# Patient Record
Sex: Female | Born: 1945 | Race: White | Hispanic: No | Marital: Married | Smoking: Former smoker
Health system: Southern US, Community
[De-identification: ages and names within clinical notes are randomized; demographics above are authoritative.]

## PROBLEM LIST (undated history)

## (undated) DIAGNOSIS — J40 Bronchitis, not specified as acute or chronic: Secondary | ICD-10-CM

## (undated) DIAGNOSIS — J301 Allergic rhinitis due to pollen: Secondary | ICD-10-CM

## (undated) DIAGNOSIS — F329 Major depressive disorder, single episode, unspecified: Secondary | ICD-10-CM

## (undated) DIAGNOSIS — J45909 Unspecified asthma, uncomplicated: Secondary | ICD-10-CM

## (undated) DIAGNOSIS — D494 Neoplasm of unspecified behavior of bladder: Secondary | ICD-10-CM

## (undated) DIAGNOSIS — G43909 Migraine, unspecified, not intractable, without status migrainosus: Secondary | ICD-10-CM

## (undated) DIAGNOSIS — Z973 Presence of spectacles and contact lenses: Secondary | ICD-10-CM

## (undated) DIAGNOSIS — F32A Depression, unspecified: Secondary | ICD-10-CM

## (undated) DIAGNOSIS — Z87442 Personal history of urinary calculi: Secondary | ICD-10-CM

## (undated) DIAGNOSIS — F419 Anxiety disorder, unspecified: Secondary | ICD-10-CM

## (undated) DIAGNOSIS — K635 Polyp of colon: Secondary | ICD-10-CM

## (undated) DIAGNOSIS — J449 Chronic obstructive pulmonary disease, unspecified: Secondary | ICD-10-CM

## (undated) DIAGNOSIS — R21 Rash and other nonspecific skin eruption: Secondary | ICD-10-CM

## (undated) HISTORY — DX: Allergic rhinitis due to pollen: J30.1

## (undated) HISTORY — DX: Unspecified asthma, uncomplicated: J45.909

## (undated) HISTORY — DX: Depression, unspecified: F32.A

## (undated) HISTORY — DX: Migraine, unspecified, not intractable, without status migrainosus: G43.909

## (undated) HISTORY — PX: COLONOSCOPY W/ POLYPECTOMY: SHX1380

## (undated) HISTORY — PX: OTHER SURGICAL HISTORY: SHX169

## (undated) HISTORY — DX: Major depressive disorder, single episode, unspecified: F32.9

## (undated) HISTORY — DX: Polyp of colon: K63.5

---

## 1988-06-19 HISTORY — PX: BREAST BIOPSY: SHX20

## 1999-05-20 ENCOUNTER — Encounter: Payer: Self-pay | Admitting: Family Medicine

## 1999-05-20 ENCOUNTER — Encounter: Admission: RE | Admit: 1999-05-20 | Discharge: 1999-05-20 | Payer: Self-pay | Admitting: Family Medicine

## 2000-09-25 ENCOUNTER — Encounter: Payer: Self-pay | Admitting: Family Medicine

## 2000-09-25 ENCOUNTER — Encounter: Admission: RE | Admit: 2000-09-25 | Discharge: 2000-09-25 | Payer: Self-pay | Admitting: Family Medicine

## 2001-10-10 ENCOUNTER — Encounter: Admission: RE | Admit: 2001-10-10 | Discharge: 2001-10-10 | Payer: Self-pay | Admitting: Family Medicine

## 2001-10-10 ENCOUNTER — Encounter: Payer: Self-pay | Admitting: Family Medicine

## 2002-10-23 ENCOUNTER — Encounter: Payer: Self-pay | Admitting: Family Medicine

## 2002-10-23 ENCOUNTER — Encounter: Admission: RE | Admit: 2002-10-23 | Discharge: 2002-10-23 | Payer: Self-pay | Admitting: Family Medicine

## 2004-01-06 ENCOUNTER — Encounter: Admission: RE | Admit: 2004-01-06 | Discharge: 2004-01-06 | Payer: Self-pay | Admitting: Family Medicine

## 2004-03-25 ENCOUNTER — Encounter (INDEPENDENT_AMBULATORY_CARE_PROVIDER_SITE_OTHER): Payer: Self-pay | Admitting: Specialist

## 2004-03-25 ENCOUNTER — Ambulatory Visit (HOSPITAL_COMMUNITY): Admission: RE | Admit: 2004-03-25 | Discharge: 2004-03-25 | Payer: Self-pay | Admitting: Gastroenterology

## 2004-11-20 ENCOUNTER — Emergency Department (HOSPITAL_COMMUNITY): Admission: EM | Admit: 2004-11-20 | Discharge: 2004-11-20 | Payer: Self-pay | Admitting: Emergency Medicine

## 2005-01-20 ENCOUNTER — Encounter: Admission: RE | Admit: 2005-01-20 | Discharge: 2005-01-20 | Payer: Self-pay | Admitting: Family Medicine

## 2005-02-08 ENCOUNTER — Encounter: Admission: RE | Admit: 2005-02-08 | Discharge: 2005-02-08 | Payer: Self-pay | Admitting: Family Medicine

## 2006-03-09 ENCOUNTER — Encounter: Admission: RE | Admit: 2006-03-09 | Discharge: 2006-03-09 | Payer: Self-pay | Admitting: Family Medicine

## 2007-04-18 ENCOUNTER — Encounter: Admission: RE | Admit: 2007-04-18 | Discharge: 2007-04-18 | Payer: Self-pay | Admitting: Family Medicine

## 2007-06-20 LAB — CONVERTED CEMR LAB: Pap Smear: NORMAL

## 2007-09-02 ENCOUNTER — Encounter: Admission: RE | Admit: 2007-09-02 | Discharge: 2007-09-02 | Payer: Self-pay | Admitting: Family Medicine

## 2008-04-20 ENCOUNTER — Encounter: Admission: RE | Admit: 2008-04-20 | Discharge: 2008-04-20 | Payer: Self-pay | Admitting: Family Medicine

## 2008-04-30 ENCOUNTER — Encounter: Admission: RE | Admit: 2008-04-30 | Discharge: 2008-04-30 | Payer: Self-pay | Admitting: Family Medicine

## 2008-04-30 ENCOUNTER — Encounter: Payer: Self-pay | Admitting: Family Medicine

## 2008-09-17 DIAGNOSIS — Z8659 Personal history of other mental and behavioral disorders: Secondary | ICD-10-CM

## 2008-10-05 ENCOUNTER — Ambulatory Visit: Payer: Self-pay | Admitting: Family Medicine

## 2008-10-05 DIAGNOSIS — M899 Disorder of bone, unspecified: Secondary | ICD-10-CM

## 2008-10-05 DIAGNOSIS — M949 Disorder of cartilage, unspecified: Secondary | ICD-10-CM

## 2009-04-05 ENCOUNTER — Telehealth: Payer: Self-pay | Admitting: Family Medicine

## 2009-04-14 ENCOUNTER — Encounter: Payer: Self-pay | Admitting: Family Medicine

## 2009-04-29 ENCOUNTER — Encounter: Admission: RE | Admit: 2009-04-29 | Discharge: 2009-04-29 | Payer: Self-pay | Admitting: Family Medicine

## 2009-05-04 ENCOUNTER — Encounter: Payer: Self-pay | Admitting: Family Medicine

## 2009-09-23 ENCOUNTER — Ambulatory Visit: Payer: Self-pay | Admitting: Family Medicine

## 2009-09-23 DIAGNOSIS — E785 Hyperlipidemia, unspecified: Secondary | ICD-10-CM

## 2009-09-23 DIAGNOSIS — Z87891 Personal history of nicotine dependence: Secondary | ICD-10-CM | POA: Insufficient documentation

## 2009-09-23 LAB — CONVERTED CEMR LAB
CO2: 31 meq/L (ref 19–32)
Calcium: 9.5 mg/dL (ref 8.4–10.5)
Cholesterol: 218 mg/dL — ABNORMAL HIGH (ref 0–200)
GFR calc non Af Amer: 66.86 mL/min (ref >60)
Total CHOL/HDL Ratio: 4

## 2009-10-04 ENCOUNTER — Telehealth: Payer: Self-pay | Admitting: Family Medicine

## 2009-10-27 ENCOUNTER — Ambulatory Visit: Payer: Self-pay | Admitting: Family Medicine

## 2009-10-27 LAB — CONVERTED CEMR LAB
Alkaline Phosphatase: 62 units/L (ref 39–117)
BUN: 20 mg/dL (ref 6–23)
Basophils Relative: 0.5 % (ref 0.0–3.0)
CO2: 29 meq/L (ref 19–32)
Chloride: 105 meq/L (ref 96–112)
Creatinine, Ser: 0.9 mg/dL (ref 0.4–1.2)
Eosinophils Relative: 1.2 % (ref 0.0–5.0)
HDL: 60.8 mg/dL (ref >39.00)
Hemoglobin: 14.7 g/dL (ref 12.0–15.0)
LDL Cholesterol: 102 mg/dL — ABNORMAL HIGH (ref 0–99)
Lymphocytes Relative: 21.7 % (ref 12.0–46.0)
Neutrophils Relative %: 71.1 % (ref 43.0–77.0)
Nitrite: NEGATIVE
Platelets: 251 10*3/uL (ref 150.0–400.0)
Triglycerides: 48 mg/dL (ref 0.0–149.0)
WBC Urine, dipstick: NEGATIVE
WBC: 8.1 10*3/uL (ref 4.5–10.5)
pH: 6

## 2009-11-01 LAB — HM PAP SMEAR

## 2009-11-03 ENCOUNTER — Ambulatory Visit: Payer: Self-pay | Admitting: Family Medicine

## 2009-11-03 ENCOUNTER — Other Ambulatory Visit: Admission: RE | Admit: 2009-11-03 | Discharge: 2009-11-03 | Payer: Self-pay | Admitting: Family Medicine

## 2009-11-03 DIAGNOSIS — R319 Hematuria, unspecified: Secondary | ICD-10-CM

## 2009-11-03 LAB — CONVERTED CEMR LAB
Nitrite: NEGATIVE
Specific Gravity, Urine: <1.005
pH: 7.5

## 2009-11-08 LAB — CONVERTED CEMR LAB: Pap Smear: NEGATIVE

## 2009-12-14 ENCOUNTER — Encounter: Payer: Self-pay | Admitting: Family Medicine

## 2010-01-10 ENCOUNTER — Encounter: Payer: Self-pay | Admitting: Family Medicine

## 2010-02-14 ENCOUNTER — Ambulatory Visit (HOSPITAL_COMMUNITY): Admission: RE | Admit: 2010-02-14 | Discharge: 2010-02-14 | Payer: Self-pay | Admitting: Urology

## 2010-02-22 ENCOUNTER — Encounter: Payer: Self-pay | Admitting: Family Medicine

## 2010-03-22 ENCOUNTER — Telehealth: Payer: Self-pay | Admitting: Family Medicine

## 2010-03-23 ENCOUNTER — Ambulatory Visit: Payer: Self-pay | Admitting: Internal Medicine

## 2010-03-23 DIAGNOSIS — F17200 Nicotine dependence, unspecified, uncomplicated: Secondary | ICD-10-CM

## 2010-05-04 ENCOUNTER — Ambulatory Visit: Payer: Self-pay | Admitting: Family Medicine

## 2010-05-04 DIAGNOSIS — N200 Calculus of kidney: Secondary | ICD-10-CM

## 2010-05-04 LAB — HM MAMMOGRAPHY: HM Mammogram: NEGATIVE

## 2010-05-05 ENCOUNTER — Encounter: Payer: Self-pay | Admitting: Family Medicine

## 2010-05-05 ENCOUNTER — Encounter: Admission: RE | Admit: 2010-05-05 | Discharge: 2010-05-05 | Payer: Self-pay | Admitting: Family Medicine

## 2010-05-05 LAB — CONVERTED CEMR LAB
AST: 24 units/L (ref 0–37)
Albumin: 4.2 g/dL (ref 3.5–5.2)
Alkaline Phosphatase: 66 units/L (ref 39–117)
HDL: 65 mg/dL (ref >39.00)
Total Bilirubin: 0.5 mg/dL (ref 0.3–1.2)
Total Protein: 6.5 g/dL (ref 6.0–8.3)
VLDL: 11 mg/dL (ref 0.0–40.0)

## 2010-07-10 ENCOUNTER — Encounter: Payer: Self-pay | Admitting: Family Medicine

## 2010-07-19 NOTE — Assessment & Plan Note (Signed)
Summary: chest congestion/dm   Vital Signs:  Patient profile:   65 year old female Menstrual status:  postmenopausal Weight:      144 pounds Temp:     97.9 degrees F oral BP sitting:   120 / 80  (right arm) Cuff size:   regular  Vitals Entered By: Duard Brady LPN (March 23, 2010 11:54 AM) CC: c/o congestion and cough - just completed amox x10days Is Patient Diabetic? No   CC:  c/o congestion and cough - just completed amox x10days.  History of Present Illness: 35  -year-old tobacco user, who [facility].  She has completed 10 days of amoxicillin and still had a nonproductive cough.  She continues to smoke.  She denies any chest pain, shortness of breath, wheezing, or purulence sputum production.  She has a history of glucose intolerance, and dyslipidemia.  Preventive Screening-Counseling & Management  Alcohol-Tobacco     Smoking Cessation Counseling: yes  Allergies: 1)  Acetyl Salicylic Acid (Aspirin)  Past History:  Past Medical History: Reviewed history from 09/17/2008 and no changes required. Depression Migraines Hay fever/Allergies Colon Polyps  Review of Systems       The patient complains of prolonged cough.  The patient denies anorexia, fever, weight loss, weight gain, vision loss, decreased hearing, hoarseness, chest pain, syncope, dyspnea on exertion, peripheral edema, headaches, hemoptysis, abdominal pain, melena, hematochezia, severe indigestion/heartburn, hematuria, incontinence, genital sores, muscle weakness, suspicious skin lesions, transient blindness, difficulty walking, depression, unusual weight change, abnormal bleeding, enlarged lymph nodes, angioedema, and breast masses.    Physical Exam  General:  Well-developed,well-nourished,in no acute distress; alert,appropriate and cooperative throughout examination Head:  Normocephalic and atraumatic without obvious abnormalities. No apparent  alopecia or balding. Eyes:  No corneal or conjunctival inflammation noted. EOMI. Perrla. Funduscopic exam benign, without hemorrhages, exudates or papilledema. Vision grossly normal. Ears:  External ear exam shows no significant lesions or deformities.  Otoscopic examination reveals clear canals, tympanic membranes are intact bilaterally without bulging, retraction, inflammation or discharge. Hearing is grossly normal bilaterally. Mouth:  Oral mucosa and oropharynx without lesions or exudates.  Teeth in good repair. Neck:  No deformities, masses, or tenderness noted. Lungs:  Normal respiratory effort, chest expands symmetrically. Lungs are clear to auscultation, no crackles or wheezes.  O2 saturation 98% Heart:  Normal rate and regular rhythm. S1 and S2 normal without gallop, murmur, click, rub or other extra sounds.   Impression & Recommendations:  Problem # 1:  URI (ICD-465.9)  Her updated medication list for this problem includes:    Hydrocodone-homatropine 5-1.5 Mg/41ml Syrp (Hydrocodone-homatropine) ..... One tsp every 6 hours for cough  Problem # 2:  TOBACCO USE (ICD-305.1)  Complete Medication List: 1)  Alendronate Sodium 70 Mg Tabs (Alendronate sodium) .... Once weekly 2)  Fluoxetine Hcl 20 Mg Tabs (Fluoxetine hcl) .... Once daily 3)  Simvastatin 20 Mg Tabs (Simvastatin) .... Once daily at bedtime 4)  Calcium-vitamin D 600-125 Mg-unit Tabs (Calcium-vitamin d) .... Once daily 5)  Omega 3 340 Mg Cpdr (Omega-3 fatty acids) .... Once daily 6)  Hydrocodone-homatropine 5-1.5 Mg/32ml Syrp (Hydrocodone-homatropine) .... One tsp every 6 hours for cough  Patient Instructions: 1)  Tobacco is very bad for your health and your loved ones! You Should stop smoking!. 2)  Get plenty of rest, drink lots of clear liquids, and use Tylenol or Ibuprofen for fever and comfort. Return in 7-10 days if you're not better:sooner if you're feeling worse.  Prescriptions: HYDROCODONE-HOMATROPINE 5-1.5 MG/5ML  SYRP (HYDROCODONE-HOMATROPINE) one tsp every 6 hours for cough  #6 oz x 1   Entered and Authorized by:   Gordy Savers  MD   Signed by:   Gordy Savers  MD on 03/23/2010   Method used:   Print then Give to Patient   RxID:   [id number]    Immunization History:  Tetanus/Td Immunization History:    Tetanus/Td:  Tdap (08/27/2007)  Influenza Immunization History:    Influenza:  Historical (03/19/2010)  Pneumovax Immunization History:    Pneumovax:  Pneumovax (11/03/2009) flu vaccine given at [facility]

## 2010-07-19 NOTE — Assessment & Plan Note (Signed)
Summary: med check/refills/pt coming in fasting/cjr   Vital Signs:  Patient profile:   65 year old female Menstrual status:  postmenopausal Height:      66 inches Weight:      154 pounds Temp:     97.5 degrees F oral BP sitting:   90 / 72  (left arm) Cuff size:   regular  Vitals Entered By: Sid Falcon LPN (September 23, 1608 8:43 AM) CC: 6 month follow-up visit, med refills   History of Present Illness: Patient seen for followup medical problems as below.  History of depression. Treated with fluoxetine for many years. Compliant with therapy. Symptoms stable. No suicidal ideation. Patient reluctant to stop.  History of osteopenia. Repeat DEXA later this year. No history of adult fracture. Not compliant with vitamin D or calcium. Ongoing nicotine use as a risk factor.  Positive family history CAD-sister age 86 and brother age 87. Sister also had type 2 diabetes. Patient does not have any personal history of CAD or peripheral vascular disease and no recent symptoms such as chest pain.patient has had previous hyperlipidemia but does not recall values and not checked in some time.  No physical in couple of years but has had colonoscopy and mammogram within the past year.  Preventive Screening-Counseling & Management  Alcohol-Tobacco     Smoking Status: current     Packs/Day: 1.5     Year Started: 1951  Allergies: 1)  Acetyl Salicylic Acid (Aspirin)  Past History:  Past Medical History: Last updated: 09/17/2008 Depression Migraines Hay fever/Allergies Colon Polyps  Past Surgical History: Last updated: 09/17/2008 Caesarean Section 1974, 1977, 1984 Breast biopsy 1990  Social History: Last updated: 10/05/2008 Married Homemaker Current Smoker Alcohol use-yes PMH reviewed for relevance, PSH reviewed for relevance  Social History: Packs/Day:  1.5  Review of Systems  The patient denies anorexia, fever, weight loss, weight gain, chest pain, syncope, dyspnea on  exertion, peripheral edema, prolonged cough, headaches, hemoptysis, abdominal pain, melena, hematochezia, severe indigestion/heartburn, muscle weakness, and depression.    Physical Exam  General:  Well-developed,well-nourished,in no acute distress; alert,appropriate and cooperative throughout examination Eyes:  pupils equal, pupils round, and pupils reactive to light.   Ears:  External ear exam shows no significant lesions or deformities.  Otoscopic examination reveals clear canals, tympanic membranes are intact bilaterally without bulging, retraction, inflammation or discharge. Hearing is grossly normal bilaterally. Mouth:  Oral mucosa and oropharynx without lesions or exudates.  Teeth in good repair. Neck:  No deformities, masses, or tenderness noted. Lungs:  Normal respiratory effort, chest expands symmetrically. Lungs are clear to auscultation, no crackles or wheezes. Heart:  normal rate and regular rhythm.   Psych:  normally interactive, good eye contact, not anxious appearing, and not depressed appearing.     Impression & Recommendations:  Problem # 1:  DEPRESSION (ICD-311) Assessment Unchanged  Her updated medication list for this problem includes:    Fluoxetine Hcl 20 Mg Tabs (Fluoxetine hcl) ..... Once daily  Problem # 2:  OSTEOPENIA (ICD-733.90) patient encouraged use vitamin D and calcium more regularly.  DEXA next year with mammogram. Her updated medication list for this problem includes:    Alendronate Sodium 70 Mg Tabs (Alendronate sodium) ..... Once weekly  Problem # 3:  HYPERLIPIDEMIA (ICD-272.4) moderate risk factors for CAD. Orders: Venipuncture ([address]) TLB-Lipid Panel (80061-LIPID)  Her updated medication list for this problem includes:    Crestor 5 Mg Tabs (Rosuvastatin calcium) ..... One by mouth once daily  Problem # 4:  PERS HX TOBACCO USE PRESENTING HAZARDS HEALTH (ICD-V15.82) discussed cessation but current motivation is low.  Complete Medication  List: 1)  Alendronate Sodium 70 Mg Tabs (Alendronate sodium) .... Once weekly 2)  Fluoxetine Hcl 20 Mg Tabs (Fluoxetine hcl) .... Once daily 3)  Crestor 5 Mg Tabs (Rosuvastatin calcium) .... One by mouth once daily  Other Orders: TLB-BMP (Basic Metabolic Panel-BMET) (80048-METABOL)  Patient Instructions: 1)  Schedule complete physical examination Prescriptions: CRESTOR 5 MG TABS (ROSUVASTATIN CALCIUM) one by mouth once daily  #30 x 5   Entered and Authorized by:   Evelena Peat MD   Signed by:   Evelena Peat MD on 09/23/2009   Method used:   Electronically to        [facility]* (retail)       [address]       [place], [place]  [address]       Ph: [phone] or [phone]       Fax: [phone]   RxID:   [id number] ALENDRONATE SODIUM 70 MG TABS (ALENDRONATE SODIUM) once weekly  #4 x 11   Entered and Authorized by:   Evelena Peat MD   Signed by:   Evelena Peat MD on 09/23/2009   Method used:   Electronically to        [facility]* (retail)       [address] [place], [place]  [address]       Ph: [phone] or [phone]       Fax: [phone]   RxID:   [id number] FLUOXETINE HCL 20 MG TABS (FLUOXETINE HCL) once daily  #30 x 11   Entered and Authorized by:   Evelena Peat MD   Signed by:   Evelena Peat MD on 09/23/2009   Method used:   Electronically to        [facility]* (retail)       [address] [place], [place]  [address]       Ph: [phone] or [phone]       Fax: [phone]   RxID:   [id number]

## 2010-07-19 NOTE — Consult Note (Signed)
Summary: [facility]  [facility]   Imported By: Maryln Gottron 12/21/2009 13:10:09  _____________________________________________________________________  External Attachment:    Type:   Image     Comment:   External Document

## 2010-07-19 NOTE — Consult Note (Signed)
Summary: [facility]  [facility]   Imported By: Maryln Gottron 02/28/2010 14:45:54  _____________________________________________________________________  External Attachment:    Type:   Image     Comment:   External Document

## 2010-07-19 NOTE — Progress Notes (Signed)
Summary: order for bone density  Phone Note Call from Patient   Caller: Patient Call For: Evelena Peat MD Summary of Call: has bone density scheduled for 05/05/10 at the [facility] on [address]. Ordered needed faxed over there before appt please. call if problem [phone] Initial call taken by: Duard Brady LPN,  March 22, 2010 4:57 PM  Follow-up for Phone Call        Rx faxed, pt informed, confirmation received Follow-up by: Sid Falcon LPN,  March 23, 2010 11:59 AM

## 2010-07-19 NOTE — Consult Note (Signed)
Summary: [facility]  [facility]   Imported By: Maryln Gottron 01/13/2010 14:24:28  _____________________________________________________________________  External Attachment:    Type:   Image     Comment:   External Document

## 2010-07-19 NOTE — Assessment & Plan Note (Signed)
Summary: 6 month rov/njr   Vital Signs:  Patient profile:   65 year old female Menstrual status:  postmenopausal Weight:      143 pounds Temp:     97.9 degrees F oral BP sitting:   110 / 72  (left arm) Cuff size:   regular  Vitals Entered By: Sid Falcon LPN (May 04, 2010 8:50 AM)  History of Present Illness: Patient here for followup regarding several items.  She had hematuria last time I saw her was referred to urologist. Large kidney stone which required lithotripsy and temporary stent. No problems since then. She is here discussing calcium supplement which is not taking consistently.  She has history of osteopenia and has scheduled DEXA scan tomorrow. Takes alendronate has been on this she estimates over 10 years. Still smokes. No history of recent fracture.   Walks regularly.  Hyperlipidemia on simvastatin. Sometimes forgets to take. No side effects from medication. No history of CAD.  Lipid Management History:      Positive NCEP/ATP III risk factors include female age 93 years old or older and current tobacco user.  Negative NCEP/ATP III risk factors include no history of early menopause without estrogen hormone replacement, non-diabetic, HDL cholesterol greater than 60, non-hypertensive, no ASHD (atherosclerotic heart disease), no prior stroke/TIA, no peripheral vascular disease, and no history of aortic aneurysm.     Allergies: 1)  Acetyl Salicylic Acid (Aspirin)  Past History:  Past Medical History: Last updated: 09/17/2008 Depression Migraines Hay fever/Allergies Colon Polyps  Past Surgical History: Last updated: 09/17/2008 Caesarean Section 1974, 1977, 1984 Breast biopsy 1990  Family History: Last updated: 10/05/2008 Arthritis Stroke Hypertension Family History Depression Migraines Colon polyps  Social History: Last updated: 10/05/2008 Married Homemaker Current Smoker Alcohol use-yes  Risk Factors: Smoking Status: current  (11/03/2009) Packs/Day: 1.5 (11/03/2009) PMH-FH-SH reviewed for relevance  Review of Systems  The patient denies anorexia, fever, chest pain, syncope, peripheral edema, prolonged cough, headaches, hemoptysis, abdominal pain, melena, hematochezia, and severe indigestion/heartburn.    Physical Exam  General:  Well-developed,well-nourished,in no acute distress; alert,appropriate and cooperative throughout examination Mouth:  Oral mucosa and oropharynx without lesions or exudates.  Teeth in good repair. Neck:  No deformities, masses, or tenderness noted. Lungs:  Normal respiratory effort, chest expands symmetrically. Lungs are clear to auscultation, no crackles or wheezes. Heart:  normal rate and regular rhythm.   Extremities:  No clubbing, cyanosis, edema, or deformity noted with normal full range of motion of all joints.   Neurologic:  alert & oriented X3 and cranial nerves II-XII intact.     Impression & Recommendations:  Problem # 1:  HYPERLIPIDEMIA (ICD-272.4)  Her updated medication list for this problem includes:    Simvastatin 20 Mg Tabs (Simvastatin) ..... Once daily at bedtime  Orders: Venipuncture ([address]) Specimen Handling ([address]) TLB-Lipid Panel (80061-LIPID) TLB-Hepatic/Liver Function Pnl (80076-HEPATIC)  Problem # 2:  OSTEOPENIA (ICD-733.90) discussed rec to get most of her calcium from dietary sources with hx of kidney stones vs other supplements. DEXA for tomorow.  Discussed interval off Fosamax since taking for several years. Her updated medication list for this problem includes:    Alendronate Sodium 70 Mg Tabs (Alendronate sodium) ..... Once weekly    Calcium-vitamin D 600-125 Mg-unit Tabs (Calcium-vitamin d) ..... Once daily  Problem # 3:  NEPHROLITHIASIS (ICD-592.0)  Complete Medication List: 1)  Alendronate Sodium 70 Mg Tabs (Alendronate sodium) .... Once weekly 2)  Fluoxetine Hcl 20 Mg Tabs (Fluoxetine hcl) .... Once daily 3)  Simvastatin  20 Mg Tabs  (Simvastatin) .... Once daily at bedtime 4)  Calcium-vitamin D 600-125 Mg-unit Tabs (Calcium-vitamin d) .... Once daily  Lipid Assessment/Plan:      Based on NCEP/ATP III, the patient's risk factor category is "0-1 risk factors".  The patient's lipid goals are as follows: Total cholesterol goal is 200; LDL cholesterol goal is 160; HDL cholesterol goal is 40; Triglyceride goal is 150.    Patient Instructions: 1)  Please schedule a follow-up appointment in 6 months .  2)  Try to get around 1200 mg calcium per day from dietary sources as much as possible. 3)  Vit D 800 to 1,000 International Units per day. 4)  Weight bearing exercise such as walking as much as possible. 5)  Hold alendronate for now Prescriptions: SIMVASTATIN 20 MG TABS (SIMVASTATIN) once daily at bedtime  #30 Each x 11   Entered and Authorized by:   Evelena Peat MD   Signed by:   Evelena Peat MD on 05/04/2010   Method used:   Electronically to        [facility] [address] [phone]* (retail)       [address] [place], [place]  [address]       Ph: [phone]       Fax: [phone]   RxID:   [id number]    Orders Added: 1)  Venipuncture [44010] 2)  Specimen Handling [99000] 3)  TLB-Lipid Panel [80061-LIPID] 4)  TLB-Hepatic/Liver Function Pnl [80076-HEPATIC] 5)  Est. Patient Level IV [[address]]

## 2010-07-19 NOTE — Assessment & Plan Note (Signed)
Summary: CPX//ALP   Vital Signs:  Patient profile:   65 year old female Menstrual status:  postmenopausal Height:      66 inches Weight:      155 pounds BMI:     25.11 Temp:     98.2 degrees F oral Pulse rate:   72 / minute Pulse rhythm:   regular Resp:     12 per minute BP sitting:   110 / 80  (left arm) Cuff size:   regular  Vitals Entered By: Sid Falcon LPN (Nov 03, 2009 8:59 AM)  Nutrition Counseling: Patient's BMI is greater than 25 and therefore counseled on weight management options. CC: CPX, pap   Chief Complaint:  CPX and pap.  History of Present Illness: Here for CPE.  Still smoking.  Mammogram 11/10 normal.  Tetanus 2009.  Last pap 2009.  No hx abnl pap and monogamous. DEXA 2009 osteopenia. Recent labs reviewed with pt.  Signif for 2 plus blood in urine.  No gross hematuria. Normal colonoscopy 2006.  Intolerant of Crestor and doing well with Simvastatin.  Preventive Screening-Counseling & Management  Alcohol-Tobacco     Smoking Status: current     Packs/Day: 1.5  Allergies: 1)  Acetyl Salicylic Acid (Aspirin)  Past History:  Past Medical History: Last updated: 09/17/2008 Depression Migraines Hay fever/Allergies Colon Polyps  Past Surgical History: Last updated: 09/17/2008 Caesarean Section 1974, 1977, 1984 Breast biopsy 1990  Family History: Last updated: 10/05/2008 Arthritis Stroke Hypertension Family History Depression Migraines Colon polyps  Social History: Last updated: 10/05/2008 Married Homemaker Current Smoker Alcohol use-yes  Risk Factors: Smoking Status: current (11/03/2009) Packs/Day: 1.5 (11/03/2009) PMH-FH-SH reviewed for relevance  Review of Systems  The patient denies anorexia, fever, weight loss, weight gain, vision loss, decreased hearing, hoarseness, chest pain, syncope, dyspnea on exertion, peripheral edema, prolonged cough, headaches, hemoptysis, abdominal pain, melena, hematochezia, severe  indigestion/heartburn, hematuria, incontinence, genital sores, muscle weakness, suspicious skin lesions, transient blindness, difficulty walking, depression, unusual weight change, abnormal bleeding, enlarged lymph nodes, and breast masses.    Physical Exam  General:  Well-developed,well-nourished,in no acute distress; alert,appropriate and cooperative throughout examination Head:  Normocephalic and atraumatic without obvious abnormalities. No apparent alopecia or balding. Eyes:  pupils equal, pupils round, and pupils reactive to light.   Ears:  External ear exam shows no significant lesions or deformities.  Otoscopic examination reveals clear canals, tympanic membranes are intact bilaterally without bulging, retraction, inflammation or discharge. Hearing is grossly normal bilaterally. Mouth:  Oral mucosa and oropharynx without lesions or exudates.  Teeth in good repair. Neck:  No deformities, masses, or tenderness noted. Breasts:  No mass, nodules, thickening, tenderness, bulging, retraction, inflamation, nipple discharge or skin changes noted.   Lungs:  Normal respiratory effort, chest expands symmetrically. Lungs are clear to auscultation, no crackles or wheezes. Heart:  Normal rate and regular rhythm. S1 and S2 normal without gallop, murmur, click, rub or other extra sounds. Abdomen:  Bowel sounds positive,abdomen soft and non-tender without masses, organomegaly or hernias noted. Genitalia:  normal introitus, no external lesions, no vaginal discharge, mucosa pink and moist, and no vaginal or cervical lesions.  Some mucosal atrophy noted. Stenotic cervical os. Extremities:  No clubbing, cyanosis, edema, or deformity noted with normal full range of motion of all joints.   Neurologic:  alert & oriented X3, cranial nerves II-XII intact, and strength normal in all extremities.   Skin:  no rashes.   Cervical Nodes:  No lymphadenopathy noted Psych:  normally interactive, good eye  contact, not anxious  appearing, and not depressed appearing.     Impression & Recommendations:  Problem # 1:  Preventive Health Care (ICD-V70.0) Smoking cessation discussed.  Pneumovax given.  Mammogram and DEXA by next November.  Problem # 2:  HEMATURIA UNSPECIFIED (ICD-599.70)  repeat UA and if hematuria persists will rec urology referral.  Orders: UA Dipstick w/o Micro (manual) ([address]) Urology Referral (Urology)  Complete Medication List: 1)  Alendronate Sodium 70 Mg Tabs (Alendronate sodium) .... Once weekly 2)  Fluoxetine Hcl 20 Mg Tabs (Fluoxetine hcl) .... Once daily 3)  Simvastatin 20 Mg Tabs (Simvastatin) .... Once daily at bedtime 4)  Calcium-vitamin D 600-125 Mg-unit Tabs (Calcium-vitamin d) .... Once daily 5)  Omega 3 340 Mg Cpdr (Omega-3 fatty acids) .... Once daily  Other Orders: Pneumococcal Vaccine ([address]) Admin 1st Vaccine (09811)  Patient Instructions: 1)  We will need to schedule your next mammogram and DEXA scan by next November. 2)  Please schedule a follow-up appointment in 6 months .  3)  Stop smoking tips: Choose a quit date. Cut down before the quit date. Decide what you will do as a substitute when you feel the urge to smoke(gum, toothpick, exercise).  4)  It is important that you exercise reguarly at least 20 minutes 5 times a week. If you develop chest pain, have severe difficulty breathing, or feel very tired, stop exercising immediately and seek medical attention.     Laboratory Results   Urine Tests    Routine Urinalysis   Color: yellow Appearance: Clear Glucose: negative   (Normal Range: Negative) Bilirubin: negative   (Normal Range: Negative) Ketone: negative   (Normal Range: Negative) Spec. Gravity: <1.005   (Normal Range: 1.003-1.035) Blood: moderate   (Normal Range: Negative) pH: 7.5   (Normal Range: 5.0-8.0) Protein: trace   (Normal Range: Negative) Urobilinogen: 0.2   (Normal Range: 0-1) Nitrite: negative   (Normal Range: Negative) Leukocyte  Esterace: small   (Normal Range: Negative)    Comments: Sid Falcon LPN  Nov 03, 2009 10:09 AM       Immunizations Administered:  Pneumonia Vaccine:    Vaccine Type: Pneumovax    Site: left deltoid    Mfr: Merck    Dose: 0.5 ml    Route: IM    Given by: Sid Falcon LPN    Exp. Date: 04/13/2011    Lot #: [id number]

## 2010-07-19 NOTE — Progress Notes (Signed)
Summary: D/C Crestor, bad side effects  Phone Note Call from Patient Call back at Home Phone [phone]   Caller: Patient Call For: Evelena Peat MD Summary of Call: VM from pt reporting she began noticing a "flat affect a few days after taking Crestor...daughter wondering what was wrong with her...took pill for about 1 week and actually had suicidal thoughts.  D/C med last Thursday and feels "back to herself".  Any suggestions for alternate? Repeat labs scheduled for May 11, ROV May 18.  [facility] in [place], or samples. Initial call taken by: Sid Falcon LPN,  October 04, 2009 9:28 AM  Follow-up for Phone Call        these symptoms would be very unusual for Crestor but d/c and start Simvastatin 20 mg by mouth at bedtime and labs in May as scheduled. Follow-up by: Evelena Peat MD,  October 04, 2009 9:51 AM  Additional Follow-up for Phone Call Additional follow up Details #1::        Pt informed Additional Follow-up by: Sid Falcon LPN,  October 04, 2009 10:24 AM    New/Updated Medications: SIMVASTATIN 20 MG TABS (SIMVASTATIN) once daily SIMVASTATIN 20 MG TABS (SIMVASTATIN) once daily at bedtime Prescriptions: SIMVASTATIN 20 MG TABS (SIMVASTATIN) once daily  #30 x 3   Entered by:   Sid Falcon LPN   Authorized by:   Evelena Peat MD   Signed by:   Sid Falcon LPN on 09/81/1914   Method used:   Electronically to        [facility]* (retail)       [address] [place], [place]  [address]       Ph: [phone] or [phone]       Fax: [phone]   RxID:   [id number]

## 2010-09-02 LAB — BASIC METABOLIC PANEL
CO2: 29 mEq/L (ref 19–32)
Creatinine, Ser: 1 mg/dL (ref 0.4–1.2)
Potassium: 4.2 mEq/L (ref 3.5–5.1)

## 2010-09-02 LAB — CBC
HCT: 44.2 % (ref 36.0–46.0)
Hemoglobin: 14.9 g/dL (ref 12.0–15.0)
MCH: 32.4 pg (ref 26.0–34.0)
RDW: 13.9 % (ref 11.5–15.5)

## 2010-09-02 LAB — SURGICAL PCR SCREEN
MRSA, PCR: NEGATIVE
Staphylococcus aureus: NEGATIVE

## 2010-09-15 ENCOUNTER — Other Ambulatory Visit: Payer: Self-pay | Admitting: Family Medicine

## 2010-11-02 ENCOUNTER — Encounter: Payer: Self-pay | Admitting: Family Medicine

## 2010-11-04 ENCOUNTER — Ambulatory Visit (INDEPENDENT_AMBULATORY_CARE_PROVIDER_SITE_OTHER): Payer: 59 | Admitting: Family Medicine

## 2010-11-04 ENCOUNTER — Encounter: Payer: Self-pay | Admitting: Family Medicine

## 2010-11-04 VITALS — BP 110/78 | Temp 97.8°F | Ht 67.0 in | Wt 149.0 lb

## 2010-11-04 DIAGNOSIS — F329 Major depressive disorder, single episode, unspecified: Secondary | ICD-10-CM

## 2010-11-04 MED ORDER — FLUOXETINE HCL 20 MG PO CAPS: 20.0000 mg | ORAL_CAPSULE | Freq: Every day | ORAL | Status: DC

## 2010-11-04 NOTE — Op Note (Signed)
[name], [name]             ACCOUNT NO.:  [id number]   MEDICAL RECORD NO.:  [id number]          PATIENT TYPE:  AMB   LOCATION:  ENDO                         FACILITY:  [facility]   PHYSICIAN:  Jordan Hawks. Elnoria Howard, MD    DATE OF BIRTH:  [birth year]   DATE OF PROCEDURE:  03/25/2004  DATE OF DISCHARGE:                                 OPERATIVE REPORT   PROCEDURE PERFORMED:  Colonoscopy.   ENDOSCOPIST:  Jordan Hawks. Elnoria Howard, MD   INDICATIONS FOR PROCEDURE:  Family history of colon cancer.   INSTRUMENT USED:  Adult Olympus colonoscope.   CONSENT:  Informed consent was obtained from the patient describing the  risks of bleeding, infection, perforation, medication reaction, 10% miss  rate for a small colon cancer or polyp and the risks of death, all of which  are not exclusive of any other complications that can occur.   PHYSICAL EXAMINATION:  CARDIAC:  Regular rate and rhythm.  LUNGS:  Clear to auscultation bilaterally.  ABDOMEN:  Soft, nontender, nondistended, positive bowel sounds.   MEDICATIONS GIVEN:  Fentanyl 75 mcg and Versed 7.5 mg.   DESCRIPTION OF PROCEDURE:  Adequate sedation was achieved while in the left  lateral decubitus position.  A rectal examination was performed which was  negative.  The colonoscope was then inserted from the anus and advanced to  the terminal ileum under direct visualization without any difficulty.  Photodocumentation of the terminal ileum and cecum was performed.  Upon  retraction of the scope, the patient was noted to have an excellent prep.  Good visualization was obtained of the cecum, ascending, transverse,  descending and sigmoid colon.  While in the descending colon, a 7 mm sessile  polyp was noted.  This was removed with hot snare polypectomy.  Good  hemostasis was achieved.  Upon further retraction of the colonoscope into  the rectum with retroflexion, the patient was noted to have mild internal  hemorrhoids and moderate external hemorrhoids.   The patient tolerated the  procedure well.  There were no complications were encountered.   PLAN:  1.  Follow-up on the biopsies.  2.  Repeat colonoscopy in 5 years.  3.  Maintain high fiber diet.     PDH/MEDQ  D:  03/25/2004  T:  03/25/2004  Job:  [id number]

## 2010-11-04 NOTE — Progress Notes (Signed)
  Subjective:    Patient ID: [name], female    DOB: Jan 05, 1946, 65 y.o.   MRN: [id number]  HPI Patient seen for medical followup. History of nicotine use and quit smoking 6 months ago. Had some mild weight gain of about 6 pounds. Overall feels well. No specific cough or shortness of breath. She has a long history of recurrent depression and takes Prozac 20 mg. Needs refill. Depression is stable. Hyperlipidemia on  Zocor 20 mg daily. No side effects. Lipids checked last November and at goal. She recently discontinued Fosamax after taking for several years. No recent fall. Kidney stone several months ago treated with lithotripsy. No recurrence since then.   Review of Systems  Constitutional: Negative for fatigue.  Respiratory: Negative for cough and shortness of breath.   Cardiovascular: Negative for chest pain, palpitations and leg swelling.  Genitourinary: Negative for dysuria.  Neurological: Negative for headaches.       Objective:   Physical Exam  Constitutional: She is oriented to person, place, and time. She appears well-developed and well-nourished.  Cardiovascular: Normal rate, regular rhythm and normal heart sounds.   Pulmonary/Chest: Effort normal and breath sounds normal. No respiratory distress. She has no wheezes. She has no rales.  Musculoskeletal: She exhibits no edema.  Neurological: She is alert and oriented to person, place, and time. No cranial nerve deficit.          Assessment & Plan:  #1 recurrent depression. Refill Prozac for one year. #2 cessation of nicotine use. Encouragement given. Work on [employer] control exercise and calorie reduction. Routine followup 6 months

## 2010-11-30 ENCOUNTER — Encounter: Payer: Self-pay | Admitting: Family Medicine

## 2010-11-30 ENCOUNTER — Ambulatory Visit (INDEPENDENT_AMBULATORY_CARE_PROVIDER_SITE_OTHER): Payer: 59 | Admitting: Family Medicine

## 2010-11-30 VITALS — BP 102/64 | Temp 98.4°F | Wt 150.0 lb

## 2010-11-30 DIAGNOSIS — J040 Acute laryngitis: Secondary | ICD-10-CM

## 2010-11-30 NOTE — Patient Instructions (Signed)
Laryngitis   Laryngitis is an inflammation and swelling of the voice box and the area around it. It may cause your voice to change. You may lose your voice entirely for a short while. The voice box (larynx) is located at the top of the airway to the lungs (windpipe, trachea). It contains the vocal cords. When the vocal cords become inflamed or infected, they swell. This is usually linked with hoarseness. It is also linked with loss of voice. If severe, this condition can block the airway. The problem is most common in late fall, winter, or early spring. With or without treatment, you should be well in 7 to 14 days.   The most common form of laryngitis is usually caused by a virus. It may also be part of a bacterial (germ) infection, or part of a common cold, bronchitis, flu, or pneumonia. People who smoke, have allergies, or strain their voices by yelling, talking, or singing may also develop laryngitis.   Laryngitis often follows or occurs during a viral upper respiratory infection. It usually gets better without treatment. Laryngitis is usually not associated with breathing difficulties. There are forms of laryngitis that happen in children. These forms can lead to serious or fatal respiratory blockage. Croup and epiglotitis (rare) are two of these forms.   Other causes of laryngitis include: laryngeal polyps; laryngeal paralysis (such as Horner Syndrome); malignant (cancerous) tumors (lumps) and changes that lead up to the tumor, allergies, and injury.   SOME COMMON SYMPTOMS OF LARYNGITIS INCLUDE:   Ø Recent or current upper respiratory infection.   Ø Hoarse, low voice and a scratchy, possibly sore throat. You also might lose your voice. You may come down with a fever. You may feel like you have a lump in your throat. You may feel very tired.   Ø Dry cough or the barking cough of croup.   Ø Fever.   Ø Swollen lymph nodes or glands in the neck.   Ø Drooping eyelid on one side (Horner Syndrome).   DIAGNOSIS   A  physical examination is usually all that is necessary for the patient with hoarseness associated with a respiratory tract infection. A patient, particularly a smoker, who has continuing hoarseness, will need to see another caregiver for tests of the throat and upper airway.   PREVENTION   Ø Avoidance of upper respiratory infections during cold and flu season may help. Using good hygienic practices such as hand washing, and avoiding people with respiratory illnesses and crowded close quarters, may also help.   Ø Quitting smoking can help prevent laryngitis, malignancies of the head and neck, and of the lungs.   Ø Do not smoke around or in the home of a child with respiratory problems.   HOME CARE INSTRUCTIONS   Ø Do not use your voice for several days. Either speak very softly or write notes until you can talk normally. Discourage your child from talking out loud.   Ø Give or take plenty of warm drinks to soothe the throat.   Ø Use a cool-mist humidifier (vaporizer) to increase air moisture. This will help relieve the tight feeling in your throat. Hot, steamy showers can also help. Keep the air humid in a child's room.   Ø Do not drink alcohol or smoke until your voice is back to normal. Quit smoking.   Ø Get plenty of rest.   Ø Drink extra fluids, such as water, fruit juice, and tea.   Ø Only take over-the-counter or prescription   medicines for pain, discomfort, or fever as directed by your caregiver.   Ø Your caregiver may prescribe an antibiotic (medications that kill germs) to treat infection caused by bacteria (germs). This will not be done if the infection is viral. Since most common laryngitis is viral, treatment with antibiotics is generally not helpful. Antibiotics do not affect viruses. Their improper use may cause more harm than good.   SEEK IMMEDIATE MEDICAL CARE IF:   Ø There is difficulty breathing, swallowing, or if drooling is present in a small child.   Ø If hoarseness has lasted for more than 1 week  in a child, or 2 weeks in an adult.   Ø You develop a temperature greater than 101. A fever above 100.4° F (38° C) may indicate the presence of a bacterial infection, such as bronchitis, tonsillitis or sinusitis, which may require an antibiotic.   Ø There is hoarseness lasting longer than 7 days.   Ø There is bleeding from the throat.   Ø The throat is getting worse rather than better.   Ø There are large, tender lumps (“swollen glands”) in the neck.   Ø The barking cough of croup develops.   COMPLICATIONS   Ø Laryngitis is rarely serious. It hardly ever lasts more than 7 days.   Ø It can be part of a more serious infection such as tonsillitis or bronchitis.   Ø In young children, a swollen larynx can obstruct the passage of air. This causes breathing difficulties and croup. This is a more serious complication.   Ø If a viral infection is followed by a bacterial (germ) infection causing tonsillitis or bronchitis, your caregiver may prescribe antibiotics.   Ø If laryngitis develops into croup, seek medical treatment immediately.   MAKE SURE YOU:   Ø Understand these instructions.   Ø Will watch your condition.   Ø Will get help right away if you are not doing well or get worse.   Document Released: 06/05/2005 Document Re-Released: 09/01/2008   [employer]® Patient Information ©2011 [employer].

## 2010-11-30 NOTE — Progress Notes (Signed)
  Subjective:    Patient ID: [name], female    DOB: 1946/06/05, 65 y.o.   MRN: [id number]  HPI Patient seen with onset 3 days ago of laryngitis symptoms. Minimal sore throat which is somewhat intermittent. She does have some nasal congestion. Minimal dry cough. No fever or chills. No GERD symptoms. She is an ex-smoker. Duration of laryngitis as above about 3-4 days at most. Recent travel to [place].   Review of Systems As per history of present illness    Objective:   Physical Exam  Constitutional: She appears well-developed and well-nourished. No distress.  HENT:  Right Ear: External ear normal.  Left Ear: External ear normal.  Nose: Nose normal.  Mouth/Throat: Oropharynx is clear and moist.  Neck: Neck supple.  Cardiovascular: Normal rate and regular rhythm.   Pulmonary/Chest: Effort normal and breath sounds normal. No respiratory distress. She has no wheezes. She has no rales.  Lymphadenopathy:    She has no cervical adenopathy.          Assessment & Plan:  Laryngitis. This is acute and very likely viral. Observation. Touch base if last over 2-3 weeks in an ex-smoker

## 2010-12-04 ENCOUNTER — Emergency Department (INDEPENDENT_AMBULATORY_CARE_PROVIDER_SITE_OTHER): Payer: 59

## 2010-12-04 ENCOUNTER — Emergency Department (HOSPITAL_BASED_OUTPATIENT_CLINIC_OR_DEPARTMENT_OTHER)
Admission: EM | Admit: 2010-12-04 | Discharge: 2010-12-04 | Disposition: A | Discharge status: Home / Self Care | Payer: 59 | Attending: Emergency Medicine | Admitting: Emergency Medicine

## 2010-12-04 DIAGNOSIS — H5789 Other specified disorders of eye and adnexa: Secondary | ICD-10-CM | POA: Insufficient documentation

## 2010-12-04 DIAGNOSIS — J069 Acute upper respiratory infection, unspecified: Secondary | ICD-10-CM | POA: Insufficient documentation

## 2010-12-04 DIAGNOSIS — R05 Cough: Secondary | ICD-10-CM

## 2010-12-04 DIAGNOSIS — H109 Unspecified conjunctivitis: Secondary | ICD-10-CM | POA: Insufficient documentation

## 2010-12-04 DIAGNOSIS — R059 Cough, unspecified: Secondary | ICD-10-CM

## 2011-02-11 ENCOUNTER — Other Ambulatory Visit: Payer: Self-pay | Admitting: Family Medicine

## 2011-02-21 ENCOUNTER — Other Ambulatory Visit: Payer: Self-pay | Admitting: Dermatology

## 2011-03-13 ENCOUNTER — Encounter: Payer: Self-pay | Admitting: Family Medicine

## 2011-03-14 ENCOUNTER — Encounter: Payer: Self-pay | Admitting: Family Medicine

## 2011-03-14 ENCOUNTER — Ambulatory Visit (INDEPENDENT_AMBULATORY_CARE_PROVIDER_SITE_OTHER): Payer: 59 | Admitting: Family Medicine

## 2011-03-14 VITALS — BP 120/80 | Temp 98.3°F | Wt 150.0 lb

## 2011-03-14 DIAGNOSIS — Z23 Encounter for immunization: Secondary | ICD-10-CM

## 2011-03-14 DIAGNOSIS — E785 Hyperlipidemia, unspecified: Secondary | ICD-10-CM

## 2011-03-14 DIAGNOSIS — M7580 Other shoulder lesions, unspecified shoulder: Secondary | ICD-10-CM

## 2011-03-14 DIAGNOSIS — M67919 Unspecified disorder of synovium and tendon, unspecified shoulder: Secondary | ICD-10-CM

## 2011-03-14 NOTE — Patient Instructions (Signed)
Touch base with phone call in one week if no improvement. Consider icing L shoulder 20-30 minutes 2 to 3 times daily next few days.

## 2011-03-14 NOTE — Progress Notes (Signed)
  Subjective:    Patient ID: [name], female    DOB: 1945-07-23, 65 y.o.   MRN: [id number]  HPI Left shoulder pain. Progressive over 6-8 weeks. No injury. Patient is right-hand dominant. Minimal pain at night. Worse first thing in the morning and late in the day. Pain with internal rotation and abduction. No weakness. No numbness. Denies neck pain. Some left trapezius pain. No alleviating factors.  Patient has the expected grandchild on the way. She had tetanus in 2009, presumably Tdap.  Needs flu vaccine.  Quit Simvastatin.  No side effects.  She states she simply does not want to take a statin.   Review of Systems  Constitutional: Negative for fever, appetite change and unexpected weight change.  Respiratory: Negative for cough and shortness of breath.   Cardiovascular: Negative for chest pain.  Musculoskeletal: Negative for back pain and joint swelling.       Objective:   Physical Exam  Constitutional: She appears well-developed and well-nourished.  Cardiovascular: Normal rate and regular rhythm.   Pulmonary/Chest: Effort normal and breath sounds normal. No respiratory distress. She has no wheezes. She has no rales.  Musculoskeletal:       No upper extremity muscle atrophy. She has somewhat prominent acromion on the left but no acute bony tenderness. No a.c. joint tenderness. No bicipital tenderness. Full range of motion left shoulder. Does have some pain with internal rotation and abduction against resistance  Neurological:       No rotator cuff weakness. Symmetric upper extremity reflexes          Assessment & Plan:  #1 L shoulder pain.  Suspect rotator cuff tendonitis.   Discussed risks and benefits of corticosteroid injection and patient consented.  After prepping skin with betadine, injected 40 mg depomedrol and 2 cc of plain xylocaine with 23 gauge one and one half inch needle using posterior lateral approach and pt tolerate well.  RAnge of motion exercises and touch  base 2 weeks if no better. #2 Hyperlipidemia.  Pt off statin. Consider follow up lipid at next check. #3 Health maintenance.  Flu vaccine given.

## 2011-04-03 ENCOUNTER — Other Ambulatory Visit: Payer: Self-pay | Admitting: Family Medicine

## 2011-04-03 DIAGNOSIS — Z1231 Encounter for screening mammogram for malignant neoplasm of breast: Secondary | ICD-10-CM

## 2011-05-08 ENCOUNTER — Ambulatory Visit
Admission: RE | Admit: 2011-05-08 | Discharge: 2011-05-08 | Disposition: A | Discharge status: Home / Self Care | Payer: 59 | Source: Ambulatory Visit | Attending: Family Medicine | Admitting: Family Medicine

## 2011-05-08 DIAGNOSIS — Z1231 Encounter for screening mammogram for malignant neoplasm of breast: Secondary | ICD-10-CM

## 2011-06-14 ENCOUNTER — Encounter: Payer: Self-pay | Admitting: Internal Medicine

## 2011-06-14 ENCOUNTER — Ambulatory Visit (INDEPENDENT_AMBULATORY_CARE_PROVIDER_SITE_OTHER): Payer: 59 | Admitting: Internal Medicine

## 2011-06-14 VITALS — BP 122/80 | HR 80 | Temp 98.0°F | Wt 150.0 lb

## 2011-06-14 DIAGNOSIS — J209 Acute bronchitis, unspecified: Secondary | ICD-10-CM

## 2011-06-14 MED ORDER — PREDNISONE 10 MG PO TABS: ORAL_TABLET | ORAL | Status: DC

## 2011-06-14 MED ORDER — HYDROCODONE-HOMATROPINE 5-1.5 MG/5ML PO SYRP: 5.0000 mL | ORAL_SOLUTION | Freq: Three times a day (TID) | ORAL | Status: AC | PRN

## 2011-06-14 MED ORDER — LEVOFLOXACIN 500 MG PO TABS: 500.0000 mg | ORAL_TABLET | Freq: Every day | ORAL | Status: AC

## 2011-06-14 NOTE — Patient Instructions (Signed)
Please call our office if your symptoms do not improve or gets worse.  

## 2011-06-14 NOTE — Progress Notes (Signed)
  Subjective:    Patient ID: [name], female    DOB: Jul 31, 1945, 65 y.o.   MRN: [id number]  HPI  65 year old white female with history of tobacco abuse reports severe cough x2 weeks. Her symptoms started off with URI symptoms initially then progressed to worsening cough. She reports cough is productive of yellowish dark mucus. She has shortness of breath associated with coughing spells. She also vomited this morning because she coughed so hard.  Review of Systems Negative for fever,  Positive for wheezing.    Past Medical History  Diagnosis Date  . Depression   . Migraines   . Hay fever     allergies  . Colon polyps     History   Social History  . Marital Status: Married    Spouse Name: N/A    Number of Children: N/A  . Years of Education: N/A   Occupational History  . Not on file.   Social History Main Topics  . Smoking status: Current Everyday Smoker -- 1.5 packs/day for 50 years    Types: Cigarettes    Last Attempt to Quit: 06/05/2010  . Smokeless tobacco: Not on file  . Alcohol Use: Yes  . Drug Use: Not on file  . Sexually Active: Not on file   Other Topics Concern  . Not on file   Social History Narrative  . No narrative on file    Past Surgical History  Procedure Date  . Caesarean secion 1974, 1977, 1984  . Breast biopsy 1990  . Cesarean section [phone]    Family History  Problem Relation Age of Onset  . Arthritis    . Stroke    . Hypertension    . Depression    . Migraines    . Colon polyps      Allergies  Allergen Reactions  . Aspirin     REACTION: vasculitis    Current Outpatient Prescriptions on File Prior to Visit  Medication Sig Dispense Refill  . Calcium (CALCIUM-CARB 600 + D) 600-200 MG-UNIT per tablet Take 1 tablet by mouth daily.        Marland Kitchen FLUoxetine (PROZAC) 20 MG capsule TAKE 1 CAPSULE BY MOUTH EVERY DAY  30 capsule  6    BP 122/80  Pulse 80  Temp(Src) 98 F (36.7 C) (Oral)  Wt 150 lb (68.04 kg)  SpO2  94%    Objective:   Physical Exam  Constitutional: She is oriented to person, place, and time. She appears well-developed and well-nourished.  HENT:  Head: Normocephalic and atraumatic.  Right Ear: External ear normal.  Left Ear: External ear normal.  Mouth/Throat: Oropharynx is clear and moist.  Neck: Neck supple.  Cardiovascular: Normal rate, regular rhythm and normal heart sounds.   Pulmonary/Chest: Effort normal. She has wheezes. She exhibits no tenderness.  Musculoskeletal: She exhibits no edema.  Lymphadenopathy:    She has no cervical adenopathy.  Neurological: She is alert and oriented to person, place, and time.  Skin: Skin is warm and dry.  Psychiatric: She has a normal mood and affect. Her behavior is normal.          Assessment & Plan:

## 2011-06-14 NOTE — Assessment & Plan Note (Signed)
A 65 year old white female with history of tobacco abuse with signs and symptoms of acute bronchitis. Treat with Levaquin 500 mg daily x10 days and prednisone taper over 15 days.  Patient advised to call office if symptoms persist or worsen.  We discussed potential side effects of fluoroquinolones.

## 2011-09-27 ENCOUNTER — Other Ambulatory Visit: Payer: Self-pay | Admitting: Family Medicine

## 2011-11-08 ENCOUNTER — Telehealth: Payer: Self-pay | Admitting: Family Medicine

## 2011-11-08 NOTE — Telephone Encounter (Signed)
Pt requesting a call back about a recommendation to another provider - a specialist for psych.

## 2011-11-08 NOTE — Telephone Encounter (Signed)
This information is needed for her son.  He will be a new patient here and she will schedule establish appt this week.

## 2011-11-08 NOTE — Telephone Encounter (Signed)
Last OV was 9/12, would you like [place] to recommend OV here to discuss?

## 2011-11-08 NOTE — Telephone Encounter (Signed)
We can give her names -eg Dr Emerson Monte but would be happy to see if we can help.  If needing formal referral needs to be seen.

## 2012-02-21 ENCOUNTER — Other Ambulatory Visit: Payer: Self-pay

## 2012-04-03 ENCOUNTER — Other Ambulatory Visit: Payer: Self-pay | Admitting: Family Medicine

## 2012-04-03 DIAGNOSIS — Z1231 Encounter for screening mammogram for malignant neoplasm of breast: Secondary | ICD-10-CM

## 2012-05-08 ENCOUNTER — Ambulatory Visit
Admission: RE | Admit: 2012-05-08 | Discharge: 2012-05-08 | Disposition: A | Discharge status: Home / Self Care | Payer: 59 | Source: Ambulatory Visit | Attending: Family Medicine | Admitting: Family Medicine

## 2012-05-08 DIAGNOSIS — Z1231 Encounter for screening mammogram for malignant neoplasm of breast: Secondary | ICD-10-CM

## 2012-05-22 ENCOUNTER — Other Ambulatory Visit: Payer: 59

## 2012-05-29 ENCOUNTER — Encounter: Payer: 59 | Admitting: Family Medicine

## 2012-06-06 ENCOUNTER — Other Ambulatory Visit (INDEPENDENT_AMBULATORY_CARE_PROVIDER_SITE_OTHER): Payer: 59

## 2012-06-06 DIAGNOSIS — Z0000 Encounter for general adult medical examination without abnormal findings: Secondary | ICD-10-CM

## 2012-06-06 LAB — LIPID PANEL
Cholesterol: 218 mg/dL — ABNORMAL HIGH (ref 0–200)
Total CHOL/HDL Ratio: 3
Triglycerides: 64 mg/dL (ref 0.0–149.0)

## 2012-06-06 LAB — POCT URINALYSIS DIPSTICK
Bilirubin, UA: NEGATIVE
Glucose, UA: NEGATIVE
Ketones, UA: NEGATIVE
Nitrite, UA: NEGATIVE
Spec Grav, UA: 1.025
pH, UA: 5

## 2012-06-06 LAB — HEPATIC FUNCTION PANEL
ALT: 16 U/L (ref 0–35)
AST: 18 U/L (ref 0–37)
Alkaline Phosphatase: 62 U/L (ref 39–117)
Bilirubin, Direct: 0.1 mg/dL (ref 0.0–0.3)
Total Bilirubin: 0.8 mg/dL (ref 0.3–1.2)
Total Protein: 7 g/dL (ref 6.0–8.3)

## 2012-06-06 LAB — CBC WITH DIFFERENTIAL/PLATELET
Basophils Absolute: 0 10*3/uL (ref 0.0–0.1)
Basophils Relative: 0.8 % (ref 0.0–3.0)
Eosinophils Absolute: 0.1 10*3/uL (ref 0.0–0.7)
Hemoglobin: 14.1 g/dL (ref 12.0–15.0)
Lymphocytes Relative: 25.6 % (ref 12.0–46.0)
MCHC: 34.2 g/dL (ref 30.0–36.0)
MCV: 91.8 fl (ref 78.0–100.0)
Monocytes Absolute: 0.3 10*3/uL (ref 0.1–1.0)
Neutro Abs: 2.9 10*3/uL (ref 1.4–7.7)
Neutrophils Relative %: 65.4 % (ref 43.0–77.0)
RDW: 13.8 % (ref 11.5–14.6)

## 2012-06-06 LAB — BASIC METABOLIC PANEL
BUN: 16 mg/dL (ref 6–23)
Calcium: 9 mg/dL (ref 8.4–10.5)
Chloride: 108 mEq/L (ref 96–112)
Creatinine, Ser: 0.9 mg/dL (ref 0.4–1.2)
GFR: 63.06 mL/min (ref >60.00)

## 2012-06-17 ENCOUNTER — Encounter: Payer: Self-pay | Admitting: Family Medicine

## 2012-06-17 ENCOUNTER — Ambulatory Visit (INDEPENDENT_AMBULATORY_CARE_PROVIDER_SITE_OTHER): Payer: 59 | Admitting: Family Medicine

## 2012-06-17 VITALS — BP 110/60 | HR 76 | Temp 97.8°F | Ht 65.5 in | Wt 154.0 lb

## 2012-06-17 DIAGNOSIS — Z0000 Encounter for general adult medical examination without abnormal findings: Secondary | ICD-10-CM

## 2012-06-17 MED ORDER — FLUOXETINE HCL 20 MG PO CAPS: 20.0000 mg | ORAL_CAPSULE | Freq: Every day | ORAL | Status: DC

## 2012-06-17 NOTE — Patient Instructions (Addendum)
Make sure you are taking calcium 1,200 mg/day and Vit d 800 IU/day

## 2012-06-17 NOTE — Progress Notes (Signed)
  Subjective:    Patient ID: [name], female    DOB: Nov 23, 1945, 66 y.o.   MRN: [id number]  HPI Patient seen for complete physical. Last pap smear 2 ago normal. No history of abnormalities. Last tetanus 2009. Colonoscopy 2011. Mammogram last month normal. Pneumovax up-to-date. Flu vaccine up to date. She had shingles vaccine around 2008. History of osteoporosis. Inconsistent calcium and vitamin D use. Took Fosamax for over 10 years and is currently on hiatus off this medication   Review of Systems  Constitutional: Negative for fever, activity change, appetite change, fatigue and unexpected weight change.  HENT: Negative for hearing loss, ear pain, sore throat and trouble swallowing.   Eyes: Negative for visual disturbance.  Respiratory: Negative for cough and shortness of breath.   Cardiovascular: Negative for chest pain and palpitations.  Gastrointestinal: Negative for abdominal pain, diarrhea, constipation and blood in stool.  Genitourinary: Negative for dysuria and hematuria.  Musculoskeletal: Negative for myalgias, back pain and arthralgias.  Skin: Negative for rash.  Neurological: Negative for dizziness, syncope and headaches.  Hematological: Negative for adenopathy.  Psychiatric/Behavioral: Negative for confusion and dysphoric mood.       Objective:   Physical Exam  Constitutional: She is oriented to person, place, and time. She appears well-developed and well-nourished.  HENT:  Head: Normocephalic and atraumatic.  Eyes: EOM are normal. Pupils are equal, round, and reactive to light.  Neck: Normal range of motion. Neck supple. No thyromegaly present.  Cardiovascular: Normal rate, regular rhythm and normal heart sounds.   No murmur heard. Pulmonary/Chest: Breath sounds normal. No respiratory distress. She has no wheezes. She has no rales.  Abdominal: Soft. Bowel sounds are normal. She exhibits no distension and no mass. There is no tenderness. There is no rebound and no  guarding.  Musculoskeletal: Normal range of motion. She exhibits no edema.  Lymphadenopathy:    She has no cervical adenopathy.  Neurological: She is alert and oriented to person, place, and time. She displays normal reflexes. No cranial nerve deficit.  Skin: No rash noted.  Psychiatric: She has a normal mood and affect. Her behavior is normal. Judgment and thought content normal.          Assessment & Plan:  Health maintenance. Tetanus is up-to-date. Labs reviewed with patient and no concerns. Continue yearly flu vaccine. Increase consistent intake of calcium and vitamin D. Consider repeat DEXA by nextyear.  She took Fosamax for > 10 years and  Does not wish to go back. Will plan repeat pap by next year.

## 2013-04-10 ENCOUNTER — Other Ambulatory Visit: Payer: Self-pay

## 2013-04-10 DIAGNOSIS — Z1231 Encounter for screening mammogram for malignant neoplasm of breast: Secondary | ICD-10-CM

## 2013-05-09 ENCOUNTER — Ambulatory Visit
Admission: RE | Admit: 2013-05-09 | Discharge: 2013-05-09 | Disposition: A | Discharge status: Home / Self Care | Payer: 59 | Source: Ambulatory Visit

## 2013-05-09 DIAGNOSIS — Z1231 Encounter for screening mammogram for malignant neoplasm of breast: Secondary | ICD-10-CM

## 2013-05-20 ENCOUNTER — Other Ambulatory Visit: Payer: Self-pay | Admitting: Family Medicine

## 2013-07-30 ENCOUNTER — Encounter: Payer: Self-pay | Admitting: Family Medicine

## 2013-07-30 ENCOUNTER — Ambulatory Visit (INDEPENDENT_AMBULATORY_CARE_PROVIDER_SITE_OTHER): Payer: 59 | Admitting: Family Medicine

## 2013-07-30 VITALS — BP 110/64 | HR 83 | Temp 98.4°F | Wt 150.0 lb

## 2013-07-30 DIAGNOSIS — J45909 Unspecified asthma, uncomplicated: Secondary | ICD-10-CM

## 2013-07-30 MED ORDER — AZITHROMYCIN 250 MG PO TABS: ORAL_TABLET | ORAL | Status: AC

## 2013-07-30 MED ORDER — METHYLPREDNISOLONE ACETATE 80 MG/ML IJ SUSP
80.0000 mg | Freq: Once | INTRAMUSCULAR | Status: AC
  Administered 2013-07-30: 80 mg via INTRAMUSCULAR

## 2013-07-30 NOTE — Progress Notes (Signed)
Pre visit review using our clinic review tool, if applicable. No additional management support is needed unless otherwise documented below in the visit note. 

## 2013-07-30 NOTE — Addendum Note (Signed)
Addended by: Marcina Millard on: 07/30/2013 04:28 PM   Modules accepted: Orders

## 2013-07-30 NOTE — Progress Notes (Signed)
   Subjective:    Patient ID: [name], female    DOB: 02-10-1946, 68 y.o.   MRN: [id number]  Cough Associated symptoms include a sore throat and wheezing. Pertinent negatives include no chills, fever or shortness of breath.   Patient seen with cough Onset about one week ago. She initially had sore throat and then subsequent productive cough. She's had wheezing off and on. She quit smoking 3 years ago. She is using over-the-counter cough drops which helps slightly. Her wheezing is intermittent. No significant dyspnea. Denies any significant nasal congestion. Sore throat is improved. No hemoptysis.  Past Medical History  Diagnosis Date  . Depression   . Migraines   . Hay fever     allergies  . Colon polyps    Past Surgical History  Procedure Laterality Date  . Caesarean secion  1974, 1977, 1984  . Breast biopsy  1990  . Cesarean section  [phone]    reports that she quit smoking about 3 years ago. Her smoking use included Cigarettes. She has a 75 pack-year smoking history. She has never used smokeless tobacco. She reports that she drinks alcohol. She reports that she does not use illicit drugs. family history includes Arthritis in an other family member; Colon polyps in an other family member; Depression in an other family member; Hypertension in an other family member; Migraines in an other family member; Stroke in an other family member. Allergies  Allergen Reactions  . Aspirin     REACTION: vasculitis      Review of Systems  Constitutional: Negative for fever and chills.  HENT: Positive for sore throat.   Respiratory: Positive for cough and wheezing. Negative for shortness of breath.        Objective:   Physical Exam  Constitutional: She appears well-developed and well-nourished.  HENT:  Right Ear: External ear normal.  Left Ear: External ear normal.  Mouth/Throat: Oropharynx is clear and moist.  Neck: Neck supple.  Cardiovascular: Normal rate.     Pulmonary/Chest: Effort normal. No respiratory distress. She has wheezes. She has no rales. She exhibits no tenderness.          Assessment & Plan:  Asthmatic bronchitis. Depo-Medrol 80 mg IM given. Given duration of productive cough and likely COPD history start Zithromax. Followup promptly for fever or worsening symptoms

## 2013-07-30 NOTE — Patient Instructions (Signed)
Acute Bronchitis Bronchitis is inflammation of the airways that extend from the windpipe into the lungs (bronchi). The inflammation often causes mucus to develop. This leads to a cough, which is the most common symptom of bronchitis.  In acute bronchitis, the condition usually develops suddenly and goes away over time, usually in a couple weeks. Smoking, allergies, and asthma can make bronchitis worse. Repeated episodes of bronchitis may cause further lung problems.  CAUSES Acute bronchitis is most often caused by the same virus that causes a cold. The virus can spread from person to person (contagious).  SIGNS AND SYMPTOMS   Cough.   Fever.   Coughing up mucus.   Body aches.   Chest congestion.   Chills.   Shortness of breath.   Sore throat.  DIAGNOSIS  Acute bronchitis is usually diagnosed through a physical exam. Tests, such as chest X-rays, are sometimes done to rule out other conditions.  TREATMENT  Acute bronchitis usually goes away in a couple weeks. Often times, no medical treatment is necessary. Medicines are sometimes given for relief of fever or cough. Antibiotics are usually not needed but may be prescribed in certain situations. In some cases, an inhaler may be recommended to help reduce shortness of breath and control the cough. A cool mist vaporizer may also be used to help thin bronchial secretions and make it easier to clear the chest.  HOME CARE INSTRUCTIONS  Get plenty of rest.   Drink enough fluids to keep your urine clear or pale yellow (unless you have a medical condition that requires fluid restriction). Increasing fluids may help thin your secretions and will prevent dehydration.   Only take over-the-counter or prescription medicines as directed by your health care provider.   Avoid smoking and secondhand smoke. Exposure to cigarette smoke or irritating chemicals will make bronchitis worse. If you are a smoker, consider using nicotine gum or skin  patches to help control withdrawal symptoms. Quitting smoking will help your lungs heal faster.   Reduce the chances of another bout of acute bronchitis by washing your hands frequently, avoiding people with cold symptoms, and trying not to touch your hands to your mouth, nose, or eyes.   Follow up with your health care provider as directed.  SEEK MEDICAL CARE IF: Your symptoms do not improve after 1 week of treatment.  SEEK IMMEDIATE MEDICAL CARE IF:  You develop an increased fever or chills.   You have chest pain.   You have severe shortness of breath.  You have bloody sputum.   You develop dehydration.  You develop fainting.  You develop repeated vomiting.  You develop a severe headache. MAKE SURE YOU:   Understand these instructions.  Will watch your condition.  Will get help right away if you are not doing well or get worse. Document Released: 07/13/2004 Document Revised: 02/05/2013 Document Reviewed: 11/26/2012 [employer] Patient Information 2014 [employer].  

## 2013-08-01 ENCOUNTER — Telehealth: Payer: Self-pay | Admitting: Family Medicine

## 2013-08-01 MED ORDER — PREDNISONE 10 MG PO TABS: ORAL_TABLET | ORAL | Status: DC

## 2013-08-01 NOTE — Telephone Encounter (Signed)
Pt states it is a steroid pill. Pt received steroid inj, but still has issues.

## 2013-08-01 NOTE — Telephone Encounter (Signed)
Pt states she was to inform dr Elease Hashimoto if she wasn't better by today and he would send rx for bronchitis  pt isn't better and will  be traveling next week Pharm: [facility]/ [place]

## 2013-08-01 NOTE — Telephone Encounter (Signed)
Prednisone 10 mg taper: 4-4-4-3-3-2-2-1-1 

## 2013-08-01 NOTE — Telephone Encounter (Signed)
Please clarify.  We sent in Zithromax already so I'm not sure what treatment she is referring to-?cough med.

## 2013-08-01 NOTE — Telephone Encounter (Signed)
Pt still has the cough and some wheezing.

## 2013-08-01 NOTE — Telephone Encounter (Signed)
Pt informed and RX sent to pharmacy  

## 2013-08-01 NOTE — Telephone Encounter (Signed)
Pt following up on request before the day gets away.

## 2013-08-13 ENCOUNTER — Telehealth: Payer: Self-pay | Admitting: Family Medicine

## 2013-08-13 MED ORDER — ZOLPIDEM TARTRATE 10 MG PO TABS: 10.0000 mg | ORAL_TABLET | Freq: Every evening | ORAL | Status: DC | PRN

## 2013-08-13 NOTE — Telephone Encounter (Signed)
Pt is traveling to [place] on sat am.  Pt would like to have a couple of pills , (one to get there one home) to help her sleep on the plane. (9+ hour flight)  Pharm: [facility]/ [place]

## 2013-08-13 NOTE — Telephone Encounter (Signed)
RX called into pharmacy and left message on pt VM.

## 2013-08-13 NOTE — Telephone Encounter (Signed)
ambien 10 mg po qhs prn #10 with no refills.

## 2013-08-13 NOTE — Telephone Encounter (Signed)
Last visit 07/30/13

## 2013-09-10 ENCOUNTER — Other Ambulatory Visit: Payer: Self-pay | Admitting: Family Medicine

## 2014-03-26 ENCOUNTER — Other Ambulatory Visit: Payer: Self-pay | Admitting: Family Medicine

## 2014-04-06 ENCOUNTER — Other Ambulatory Visit: Payer: Self-pay

## 2014-04-06 DIAGNOSIS — Z1231 Encounter for screening mammogram for malignant neoplasm of breast: Secondary | ICD-10-CM

## 2014-05-07 ENCOUNTER — Other Ambulatory Visit: Payer: Self-pay | Admitting: Family Medicine

## 2014-05-12 ENCOUNTER — Ambulatory Visit
Admission: RE | Admit: 2014-05-12 | Discharge: 2014-05-12 | Disposition: A | Discharge status: Home / Self Care | Payer: 59 | Source: Ambulatory Visit

## 2014-05-12 DIAGNOSIS — Z1231 Encounter for screening mammogram for malignant neoplasm of breast: Secondary | ICD-10-CM

## 2014-07-01 ENCOUNTER — Encounter: Payer: Self-pay | Admitting: Family Medicine

## 2014-07-25 ENCOUNTER — Other Ambulatory Visit: Payer: Self-pay | Admitting: Family Medicine

## 2014-09-02 ENCOUNTER — Encounter: Payer: Self-pay | Admitting: Family Medicine

## 2014-09-02 ENCOUNTER — Ambulatory Visit (INDEPENDENT_AMBULATORY_CARE_PROVIDER_SITE_OTHER)
Admission: RE | Admit: 2014-09-02 | Discharge: 2014-09-02 | Disposition: A | Discharge status: Home / Self Care | Payer: 59 | Source: Ambulatory Visit | Attending: Family Medicine | Admitting: Family Medicine

## 2014-09-02 ENCOUNTER — Ambulatory Visit (INDEPENDENT_AMBULATORY_CARE_PROVIDER_SITE_OTHER): Payer: 59 | Admitting: Family Medicine

## 2014-09-02 VITALS — BP 110/66 | HR 62 | Temp 97.7°F | Wt 151.0 lb

## 2014-09-02 DIAGNOSIS — M25461 Effusion, right knee: Secondary | ICD-10-CM

## 2014-09-02 NOTE — Progress Notes (Signed)
Pre visit review using our clinic review tool, if applicable. No additional management support is needed unless otherwise documented below in the visit note. 

## 2014-09-02 NOTE — Progress Notes (Signed)
   Subjective:    Patient ID: [name], female    DOB: December 12, 1945, 69 y.o.   MRN: [id number]  HPI Acute visit for right knee pain and effusion. Present for about one year. No history of injury. Her pain is relatively mild and more lateral. She has pain both day and night. She's not taken any medications. She's had some improvement in his swelling with elevation and ice. No locking or giving way. Frequent stiffness after prolonged periods of sitting. No instability. Was previously walking about 5 miles per day but had to scale back secondary to knee pain.  No warmth or erythema. No history of gout or pseudogout.  Past Medical History  Diagnosis Date  . Depression   . Migraines   . Hay fever     allergies  . Colon polyps    Past Surgical History  Procedure Laterality Date  . Caesarean secion  1974, 1977, 1984  . Breast biopsy  1990  . Cesarean section  [phone]    reports that she quit smoking about 4 years ago. Her smoking use included Cigarettes. She has a 75 pack-year smoking history. She has never used smokeless tobacco. She reports that she drinks alcohol. She reports that she does not use illicit drugs. family history includes Arthritis in an other family member; Colon polyps in an other family member; Depression in an other family member; Hypertension in an other family member; Migraines in an other family member; Stroke in an other family member. Allergies  Allergen Reactions  . Aspirin     REACTION: vasculitis      Review of Systems  Constitutional: Negative for fever and chills.  Musculoskeletal: Negative for gait problem.       Objective:   Physical Exam  Constitutional: She appears well-developed and well-nourished.  Cardiovascular: Normal rate and regular rhythm.   Musculoskeletal:  Right knee reveals crepitus with flexion-extension. Mild effusion. No localized tenderness. Collateral and cruciate ligament testing is normal.          Assessment  & Plan:  Right knee effusion and pain present for one year. Differential would include meniscus injury versus osteoarthritis. Obtain x-rays to further assess. If this shows significant degenerative changes consider corticosteroid injection

## 2014-09-03 ENCOUNTER — Other Ambulatory Visit: Payer: Self-pay | Admitting: Family Medicine

## 2014-09-03 DIAGNOSIS — M25561 Pain in right knee: Secondary | ICD-10-CM

## 2014-09-17 ENCOUNTER — Ambulatory Visit: Payer: 59 | Admitting: Family Medicine

## 2014-10-15 ENCOUNTER — Other Ambulatory Visit: Payer: Self-pay | Admitting: Family Medicine

## 2014-11-02 ENCOUNTER — Ambulatory Visit: Payer: 59 | Admitting: Family Medicine

## 2014-11-02 ENCOUNTER — Other Ambulatory Visit: Payer: Self-pay | Admitting: Orthopedic Surgery

## 2014-11-02 ENCOUNTER — Ambulatory Visit
Admission: RE | Admit: 2014-11-02 | Discharge: 2014-11-02 | Disposition: A | Discharge status: Home / Self Care | Payer: 59 | Source: Ambulatory Visit | Attending: Orthopedic Surgery | Admitting: Orthopedic Surgery

## 2014-11-02 ENCOUNTER — Encounter (HOSPITAL_COMMUNITY): Payer: Self-pay | Admitting: *Deleted

## 2014-11-02 DIAGNOSIS — M25531 Pain in right wrist: Secondary | ICD-10-CM

## 2014-11-03 ENCOUNTER — Ambulatory Visit (HOSPITAL_COMMUNITY): Payer: 59 | Admitting: Anesthesiology

## 2014-11-03 ENCOUNTER — Encounter (HOSPITAL_COMMUNITY)
Admission: RE | Disposition: A | Discharge status: Home / Self Care | Payer: Self-pay | Source: Ambulatory Visit | Attending: Orthopedic Surgery

## 2014-11-03 ENCOUNTER — Observation Stay (HOSPITAL_COMMUNITY)
Admission: RE | Admit: 2014-11-03 | Discharge: 2014-11-04 | Disposition: A | Discharge status: Home / Self Care | Payer: 59 | Source: Ambulatory Visit | Attending: Orthopedic Surgery | Admitting: Orthopedic Surgery

## 2014-11-03 ENCOUNTER — Encounter (HOSPITAL_COMMUNITY): Payer: Self-pay | Admitting: *Deleted

## 2014-11-03 DIAGNOSIS — M8580 Other specified disorders of bone density and structure, unspecified site: Secondary | ICD-10-CM | POA: Insufficient documentation

## 2014-11-03 DIAGNOSIS — Z87442 Personal history of urinary calculi: Secondary | ICD-10-CM | POA: Diagnosis not present

## 2014-11-03 DIAGNOSIS — Z87891 Personal history of nicotine dependence: Secondary | ICD-10-CM | POA: Diagnosis not present

## 2014-11-03 DIAGNOSIS — Z886 Allergy status to analgesic agent status: Secondary | ICD-10-CM | POA: Diagnosis not present

## 2014-11-03 DIAGNOSIS — E785 Hyperlipidemia, unspecified: Secondary | ICD-10-CM | POA: Diagnosis not present

## 2014-11-03 DIAGNOSIS — F419 Anxiety disorder, unspecified: Secondary | ICD-10-CM | POA: Diagnosis not present

## 2014-11-03 DIAGNOSIS — S52571A Other intraarticular fracture of lower end of right radius, initial encounter for closed fracture: Principal | ICD-10-CM | POA: Insufficient documentation

## 2014-11-03 DIAGNOSIS — F329 Major depressive disorder, single episode, unspecified: Secondary | ICD-10-CM | POA: Diagnosis not present

## 2014-11-03 DIAGNOSIS — X58XXXA Exposure to other specified factors, initial encounter: Secondary | ICD-10-CM | POA: Insufficient documentation

## 2014-11-03 DIAGNOSIS — S52501A Unspecified fracture of the lower end of right radius, initial encounter for closed fracture: Secondary | ICD-10-CM | POA: Diagnosis present

## 2014-11-03 HISTORY — DX: Bronchitis, not specified as acute or chronic: J40

## 2014-11-03 HISTORY — DX: Personal history of urinary calculi: Z87.442

## 2014-11-03 HISTORY — PX: OPEN REDUCTION INTERNAL FIXATION (ORIF) DISTAL RADIAL FRACTURE: SHX5989

## 2014-11-03 HISTORY — DX: Anxiety disorder, unspecified: F41.9

## 2014-11-03 LAB — CBC
HCT: 41 % (ref 36.0–46.0)
Hemoglobin: 13.4 g/dL (ref 12.0–15.0)
MCH: 30.8 pg (ref 26.0–34.0)
MCHC: 32.7 g/dL (ref 30.0–36.0)
MCV: 94.3 fL (ref 78.0–100.0)
Platelets: 268 10*3/uL (ref 150–400)
RBC: 4.35 MIL/uL (ref 3.87–5.11)
RDW: 13.6 % (ref 11.5–15.5)
WBC: 5.2 10*3/uL (ref 4.0–10.5)

## 2014-11-03 SURGERY — OPEN REDUCTION INTERNAL FIXATION (ORIF) DISTAL RADIUS FRACTURE
Anesthesia: Regional | Site: Wrist | Laterality: Right

## 2014-11-03 MED ORDER — FAMOTIDINE 20 MG PO TABS: 20.0000 mg | ORAL_TABLET | Freq: Two times a day (BID) | ORAL | Status: DC | PRN

## 2014-11-03 MED ORDER — MIDAZOLAM HCL 2 MG/2ML IJ SOLN
INTRAMUSCULAR | Status: AC
  Administered 2014-11-03: 1 mg
  Filled 2014-11-03: qty 2

## 2014-11-03 MED ORDER — EPHEDRINE SULFATE 50 MG/ML IJ SOLN
INTRAMUSCULAR | Status: DC | PRN
  Administered 2014-11-03: 5 mg via INTRAVENOUS
  Administered 2014-11-03 (×2): 10 mg via INTRAVENOUS

## 2014-11-03 MED ORDER — BUPIVACAINE HCL (PF) 0.25 % IJ SOLN
30.0000 mL | INTRAMUSCULAR | Status: AC
  Filled 2014-11-03: qty 30

## 2014-11-03 MED ORDER — MORPHINE SULFATE 2 MG/ML IJ SOLN
1.0000 mg | INTRAMUSCULAR | Status: DC | PRN
  Administered 2014-11-04 (×2): 1 mg via INTRAVENOUS
  Filled 2014-11-03 (×2): qty 1

## 2014-11-03 MED ORDER — PROPOFOL 10 MG/ML IV BOLUS
INTRAVENOUS | Status: DC | PRN
  Administered 2014-11-03: 200 mg via INTRAVENOUS

## 2014-11-03 MED ORDER — FENTANYL CITRATE (PF) 100 MCG/2ML IJ SOLN
INTRAMUSCULAR | Status: AC
  Administered 2014-11-03: 50 ug
  Filled 2014-11-03: qty 2

## 2014-11-03 MED ORDER — LACTATED RINGERS IV SOLN
INTRAVENOUS | Status: DC
  Administered 2014-11-04: 06:00:00 via INTRAVENOUS

## 2014-11-03 MED ORDER — CEFAZOLIN SODIUM-DEXTROSE 2-3 GM-% IV SOLR
INTRAVENOUS | Status: DC | PRN
  Administered 2014-11-03: 2 g via INTRAVENOUS

## 2014-11-03 MED ORDER — ONDANSETRON HCL 4 MG PO TABS
4.0000 mg | ORAL_TABLET | Freq: Four times a day (QID) | ORAL | Status: DC | PRN
Start: 2014-11-03 — End: 2014-11-04

## 2014-11-03 MED ORDER — PROPOFOL 10 MG/ML IV BOLUS
INTRAVENOUS | Status: AC
  Filled 2014-11-03: qty 20

## 2014-11-03 MED ORDER — FLUOXETINE HCL 20 MG PO CAPS
20.0000 mg | ORAL_CAPSULE | Freq: Every day | ORAL | Status: DC
  Administered 2014-11-04: 20 mg via ORAL
  Filled 2014-11-03 (×2): qty 1

## 2014-11-03 MED ORDER — CEFAZOLIN SODIUM 1-5 GM-% IV SOLN: 1.0000 g | INTRAVENOUS | Status: DC

## 2014-11-03 MED ORDER — MIDAZOLAM HCL 2 MG/2ML IJ SOLN: 0.5000 mg | Freq: Once | INTRAMUSCULAR | Status: DC | PRN

## 2014-11-03 MED ORDER — METHOCARBAMOL 500 MG PO TABS
500.0000 mg | ORAL_TABLET | Freq: Four times a day (QID) | ORAL | Status: DC | PRN
  Administered 2014-11-04 (×3): 500 mg via ORAL
  Filled 2014-11-03 (×3): qty 1

## 2014-11-03 MED ORDER — MIDAZOLAM HCL 2 MG/2ML IJ SOLN
INTRAMUSCULAR | Status: AC
  Filled 2014-11-03: qty 2

## 2014-11-03 MED ORDER — MEPERIDINE HCL 25 MG/ML IJ SOLN: 6.2500 mg | INTRAMUSCULAR | Status: DC | PRN

## 2014-11-03 MED ORDER — CEFAZOLIN SODIUM 1-5 GM-% IV SOLN
1.0000 g | Freq: Three times a day (TID) | INTRAVENOUS | Status: AC
  Administered 2014-11-04 (×2): 1 g via INTRAVENOUS
  Filled 2014-11-03 (×2): qty 50

## 2014-11-03 MED ORDER — LACTATED RINGERS IV SOLN
INTRAVENOUS | Status: DC
  Administered 2014-11-03 (×2): via INTRAVENOUS

## 2014-11-03 MED ORDER — CEFAZOLIN SODIUM 1-5 GM-% IV SOLN
1.0000 g | Freq: Three times a day (TID) | INTRAVENOUS | Status: DC
  Filled 2014-11-03: qty 50

## 2014-11-03 MED ORDER — METHOCARBAMOL 1000 MG/10ML IJ SOLN: 500.0000 mg | Freq: Four times a day (QID) | INTRAVENOUS | Status: DC | PRN

## 2014-11-03 MED ORDER — VITAMIN C 500 MG PO TABS
1000.0000 mg | ORAL_TABLET | Freq: Every day | ORAL | Status: DC
  Administered 2014-11-03 – 2014-11-04 (×2): 1000 mg via ORAL
  Filled 2014-11-03 (×3): qty 2

## 2014-11-03 MED ORDER — LIDOCAINE HCL (CARDIAC) 10 MG/ML IV SOLN
INTRAVENOUS | Status: DC | PRN
  Administered 2014-11-03: 20 mg via INTRAVENOUS

## 2014-11-03 MED ORDER — HYDROMORPHONE HCL 1 MG/ML IJ SOLN: 0.2500 mg | INTRAMUSCULAR | Status: DC | PRN

## 2014-11-03 MED ORDER — BUPIVACAINE-EPINEPHRINE (PF) 0.5% -1:200000 IJ SOLN
INTRAMUSCULAR | Status: DC | PRN
  Administered 2014-11-03: 30 mL via PERINEURAL

## 2014-11-03 MED ORDER — SENNA 8.6 MG PO TABS
1.0000 | ORAL_TABLET | Freq: Two times a day (BID) | ORAL | Status: DC
  Administered 2014-11-03 – 2014-11-04 (×2): 8.6 mg via ORAL
  Filled 2014-11-03 (×3): qty 1

## 2014-11-03 MED ORDER — LIDOCAINE-EPINEPHRINE (PF) 1.5 %-1:200000 IJ SOLN
INTRAMUSCULAR | Status: DC | PRN
  Administered 2014-11-03: 25 mL via PERINEURAL

## 2014-11-03 MED ORDER — FENTANYL CITRATE (PF) 100 MCG/2ML IJ SOLN
INTRAMUSCULAR | Status: DC | PRN
  Administered 2014-11-03: 100 ug via INTRAVENOUS

## 2014-11-03 MED ORDER — OXYCODONE HCL 5 MG PO TABS
5.0000 mg | ORAL_TABLET | ORAL | Status: DC | PRN
  Administered 2014-11-03 – 2014-11-04 (×2): 10 mg via ORAL
  Filled 2014-11-03 (×2): qty 2

## 2014-11-03 MED ORDER — FENTANYL CITRATE (PF) 250 MCG/5ML IJ SOLN
INTRAMUSCULAR | Status: AC
  Filled 2014-11-03: qty 5

## 2014-11-03 MED ORDER — ONDANSETRON HCL 4 MG/2ML IJ SOLN: 4.0000 mg | Freq: Four times a day (QID) | INTRAMUSCULAR | Status: DC | PRN

## 2014-11-03 MED ORDER — 0.9 % SODIUM CHLORIDE (POUR BTL) OPTIME
TOPICAL | Status: DC | PRN
  Administered 2014-11-03: 1000 mL

## 2014-11-03 MED ORDER — LIDOCAINE HCL (CARDIAC) 20 MG/ML IV SOLN
INTRAVENOUS | Status: AC
  Filled 2014-11-03: qty 5

## 2014-11-03 MED ORDER — ONDANSETRON HCL 4 MG/2ML IJ SOLN
INTRAMUSCULAR | Status: AC
  Filled 2014-11-03: qty 2

## 2014-11-03 MED ORDER — ONDANSETRON HCL 4 MG/2ML IJ SOLN
INTRAMUSCULAR | Status: DC | PRN
  Administered 2014-11-03: 4 mg via INTRAVENOUS

## 2014-11-03 MED ORDER — PROMETHAZINE HCL 25 MG/ML IJ SOLN: 6.2500 mg | INTRAMUSCULAR | Status: DC | PRN

## 2014-11-03 MED ORDER — ALPRAZOLAM 0.5 MG PO TABS: 0.5000 mg | ORAL_TABLET | Freq: Four times a day (QID) | ORAL | Status: DC | PRN

## 2014-11-03 SURGICAL SUPPLY — 73 items
BANDAGE ELASTIC 3 VELCRO ST LF (GAUZE/BANDAGES/DRESSINGS) ×3 IMPLANT
BANDAGE ELASTIC 4 VELCRO ST LF (GAUZE/BANDAGES/DRESSINGS) ×3 IMPLANT
BIT DRILL 2.0 LNG QUCK RELEASE (BIT) IMPLANT
BIT DRILL 2.8X5 QR DISP (BIT) ×2 IMPLANT
BLADE SURG ROTATE 9660 (MISCELLANEOUS) IMPLANT
BNDG CMPR 9X4 STRL LF SNTH (GAUZE/BANDAGES/DRESSINGS) ×1
BNDG CONFORM 3 STRL LF (GAUZE/BANDAGES/DRESSINGS) ×2 IMPLANT
BNDG ESMARK 4X9 LF (GAUZE/BANDAGES/DRESSINGS) ×3 IMPLANT
BNDG GAUZE ELAST 4 BULKY (GAUZE/BANDAGES/DRESSINGS) ×9 IMPLANT
CORDS BIPOLAR (ELECTRODE) ×3 IMPLANT
COVER SURGICAL LIGHT HANDLE (MISCELLANEOUS) ×3 IMPLANT
CUFF TOURNIQUET SINGLE 18IN (TOURNIQUET CUFF) ×3 IMPLANT
CUFF TOURNIQUET SINGLE 24IN (TOURNIQUET CUFF) IMPLANT
DECANTER SPIKE VIAL GLASS SM (MISCELLANEOUS) IMPLANT
DRAIN TLS ROUND 10FR (DRAIN) IMPLANT
DRAPE OEC MINIVIEW 54X84 (DRAPES) ×2 IMPLANT
DRAPE U-SHAPE 47X51 STRL (DRAPES) ×3 IMPLANT
DRILL 2.0 LNG QUICK RELEASE (BIT) ×3
DRSG ADAPTIC 3X8 NADH LF (GAUZE/BANDAGES/DRESSINGS) ×3 IMPLANT
DRSG EMULSION OIL 3X3 NADH (GAUZE/BANDAGES/DRESSINGS) ×2 IMPLANT
GAUZE SPONGE 4X4 12PLY STRL (GAUZE/BANDAGES/DRESSINGS) ×3 IMPLANT
GAUZE XEROFORM 1X8 LF (GAUZE/BANDAGES/DRESSINGS) ×2 IMPLANT
GAUZE XEROFORM 5X9 LF (GAUZE/BANDAGES/DRESSINGS) ×3 IMPLANT
GLOVE ORTHO TXT STRL SZ7.5 (GLOVE) ×3 IMPLANT
GLOVE SS BIOGEL STRL SZ 8 (GLOVE) ×1 IMPLANT
GLOVE SUPERSENSE BIOGEL SZ 8 (GLOVE) ×2
GOWN STRL REUS W/ TWL LRG LVL3 (GOWN DISPOSABLE) ×3 IMPLANT
GOWN STRL REUS W/ TWL XL LVL3 (GOWN DISPOSABLE) ×3 IMPLANT
GOWN STRL REUS W/TWL LRG LVL3 (GOWN DISPOSABLE) ×9
GOWN STRL REUS W/TWL XL LVL3 (GOWN DISPOSABLE) ×9
KIT BASIN OR (CUSTOM PROCEDURE TRAY) ×3 IMPLANT
KIT ROOM TURNOVER OR (KITS) ×3 IMPLANT
LOOP VESSEL MAXI BLUE (MISCELLANEOUS) IMPLANT
MANIFOLD NEPTUNE II (INSTRUMENTS) ×3 IMPLANT
NEEDLE 22X1 1/2 (OR ONLY) (NEEDLE) IMPLANT
NS IRRIG 1000ML POUR BTL (IV SOLUTION) ×3 IMPLANT
PACK ORTHO EXTREMITY (CUSTOM PROCEDURE TRAY) ×3 IMPLANT
PAD ARMBOARD 7.5X6 YLW CONV (MISCELLANEOUS) ×6 IMPLANT
PAD CAST 4YDX4 CTTN HI CHSV (CAST SUPPLIES) ×1 IMPLANT
PADDING CAST ABS 3INX4YD NS (CAST SUPPLIES) ×2
PADDING CAST ABS COTTON 3X4 (CAST SUPPLIES) IMPLANT
PADDING CAST COTTON 4X4 STRL (CAST SUPPLIES) ×3
PLATE ACULOCK 2 NARROW RT (Plate) ×2 IMPLANT
PUTTY DBM STAGRAFT PLUS 2CC (Putty) ×2 IMPLANT
SCREW CORT FX14X2.3XLCK NS (Screw) IMPLANT
SCREW CORTICAL LOCKING 2.3X14M (Screw) ×3 IMPLANT
SCREW CORTICAL LOCKING 2.3X18M (Screw) ×3 IMPLANT
SCREW CORTICAL LOCKING 2.3X20M (Screw) ×6 IMPLANT
SCREW CORTICAL LOCKING 2.3X22M (Screw) ×6 IMPLANT
SCREW FX18X2.3XSMTH LCK NS CRT (Screw) IMPLANT
SCREW FX20X2.3XSMTH LCK NS CRT (Screw) IMPLANT
SCREW FX22X2.3XLCK SMTH NS CRT (Screw) IMPLANT
SCREW NLCKG 13 3.5X13 HEXA (Screw) IMPLANT
SCREW NON-LOCK 3.5X13 (Screw) ×3 IMPLANT
SCREW NONLOCK HEX 3.5X12 (Screw) ×6 IMPLANT
SPLINT FIBERGLASS 3X12 (CAST SUPPLIES) ×2 IMPLANT
SPONGE GAUZE 4X4 12PLY STER LF (GAUZE/BANDAGES/DRESSINGS) ×2 IMPLANT
SPONGE LAP 4X18 X RAY DECT (DISPOSABLE) IMPLANT
SUCTION FRAZIER TIP 10 FR DISP (SUCTIONS) ×2 IMPLANT
SUT MNCRL AB 4-0 PS2 18 (SUTURE) ×3 IMPLANT
SUT PROLENE 3 0 PS 2 (SUTURE) IMPLANT
SUT PROLENE 4 0 PS 2 18 (SUTURE) ×6 IMPLANT
SUT VIC AB 3-0 FS2 27 (SUTURE) ×2 IMPLANT
SYR CONTROL 10ML LL (SYRINGE) IMPLANT
SYSTEM CHEST DRAIN TLS 7FR (DRAIN) ×2 IMPLANT
TOWEL OR 17X24 6PK STRL BLUE (TOWEL DISPOSABLE) ×3 IMPLANT
TOWEL OR 17X26 10 PK STRL BLUE (TOWEL DISPOSABLE) ×3 IMPLANT
TUBE CONNECTING 12'X1/4 (SUCTIONS) ×1
TUBE CONNECTING 12X1/4 (SUCTIONS) ×2 IMPLANT
TUBE EVACUATION TLS (MISCELLANEOUS) ×3 IMPLANT
UNDERPAD 30X30 INCONTINENT (UNDERPADS AND DIAPERS) ×3 IMPLANT
WATER STERILE IRR 1000ML POUR (IV SOLUTION) ×3 IMPLANT
YANKAUER SUCT BULB TIP NO VENT (SUCTIONS) ×2 IMPLANT

## 2014-11-03 NOTE — Anesthesia Postprocedure Evaluation (Signed)
  Anesthesia Post-op Note  Patient: [name]  Procedure(s) Performed: Procedure(s): OPEN REDUCTION INTERNAL FIXATION (ORIF) RIGHT DISTAL RADIAL FRACTURE WITH REPAIR AND RECONSTRUCTION (Right)  Patient Location: PACU  Anesthesia Type:GA combined with regional for post-op pain  Level of Consciousness: awake, alert , oriented and patient cooperative  Airway and Oxygen Therapy: Patient Spontanous Breathing  Post-op Pain: none  Post-op Assessment: Post-op Vital signs reviewed, Patient's Cardiovascular Status Stable, Respiratory Function Stable, Patent Airway, No signs of Nausea or vomiting and Pain level controlled  Post-op Vital Signs: Reviewed and stable  Last Vitals:  Filed Vitals:   11/03/14 1830  BP:   Pulse: 82  Temp:   Resp:     Complications: No apparent anesthesia complications

## 2014-11-03 NOTE — Anesthesia Preprocedure Evaluation (Addendum)
Anesthesia Evaluation  Patient identified by MRN, date of birth, ID band Patient awake    Reviewed: Allergy & Precautions, NPO status , Patient's Chart, lab work & pertinent test results  History of Anesthesia Complications Negative for: history of anesthetic complications  Airway Mallampati: II  TM Distance: >3 FB Neck ROM: Full    Dental  (+) Caps, Dental Advisory Given   Pulmonary former smoker (quit 2011),  breath sounds clear to auscultation        Cardiovascular - anginanegative cardio ROS  Rhythm:Regular Rate:Normal     Neuro/Psych Anxiety Depression negative neurological ROS     GI/Hepatic negative GI ROS, Neg liver ROS,   Endo/Other  negative endocrine ROS  Renal/GU negative Renal ROS     Musculoskeletal   Abdominal   Peds  Hematology negative hematology ROS (+)   Anesthesia Other Findings   Reproductive/Obstetrics                            Anesthesia Physical Anesthesia Plan  ASA: II  Anesthesia Plan: Regional and General   Post-op Pain Management:    Induction: Intravenous  Airway Management Planned: LMA  Additional Equipment:   Intra-op Plan:   Post-operative Plan:   Informed Consent: I have reviewed the patients History and Physical, chart, labs and discussed the procedure including the risks, benefits and alternatives for the proposed anesthesia with the patient or authorized representative who has indicated his/her understanding and acceptance.   Dental advisory given  Plan Discussed with: CRNA and Surgeon  Anesthesia Plan Comments: (Plan routine monitors, GA with axillary block for post op analgesia, LMA OK  )        Anesthesia Quick Evaluation

## 2014-11-03 NOTE — Transfer of Care (Signed)
Immediate Anesthesia Transfer of Care Note  Patient: [name]  Procedure(s) Performed: Procedure(s): OPEN REDUCTION INTERNAL FIXATION (ORIF) RIGHT DISTAL RADIAL FRACTURE WITH REPAIR AND RECONSTRUCTION (Right)  Patient Location: PACU  Anesthesia Type:General  Level of Consciousness: awake, alert , oriented and patient cooperative  Airway & Oxygen Therapy: Patient Spontanous Breathing and Patient connected to nasal cannula oxygen  Post-op Assessment: Report given to RN and Post -op Vital signs reviewed and stable  Post vital signs: Reviewed and stable  Complications: No apparent anesthesia complications

## 2014-11-03 NOTE — Op Note (Signed)
See [id number] Amedeo Plenty MD

## 2014-11-03 NOTE — Anesthesia Procedure Notes (Addendum)
Anesthesia Regional Block:  Axillary brachial plexus block  Pre-Anesthetic Checklist: ,, timeout performed, Correct Patient, Correct Site, Correct Laterality, Correct Procedure, Correct Position, site marked, Risks and benefits discussed,  Surgical consent,  Pre-op evaluation,  At surgeon's request and post-op pain management  Laterality: Right and Upper  Prep: chloraprep       Needles:  Injection technique: Single-shot  Needle Type: Other   (#22ga short "B" bevel)    Needle Gauge: 22 and 22 G    Additional Needles:  Procedures: paresthesia technique Axillary brachial plexus block  Nerve Stimulator or Paresthesia:  Response: transient median nerve paresthesia,  Response: transient ulnar nerve paresthesia,   Additional Responses:   Narrative:  Start time: 11/03/2014 3:37 PM End time: 11/03/2014 3:43 PM Injection made incrementally with aspirations every 5 mL.  Performed by: Personally  Anesthesiologist: Glennon Mac, [place]  Additional Notes: Pt identified in Holding room.  Monitors applied. Working IV access confirmed. Sterile prep R axilla.  #22ga short "B" bevel to transient median and ulnar nerve paresthesiae.  Total 30cc 0.5% Bupivacaine with 1:200k epi and 20cc 1.5% lidocaine with 1:200k epi injected incrementally after negative test doses, distributed around each paresthesia.  Additional 5cc 1.5% lidocaine with 1:200k epi subq for intercostobrachial supplementation. Patient asymptomatic, VSS, no heme aspirated, tolerated well.  Jenita Seashore, MD   Procedure Name: LMA Insertion Date/Time: 11/03/2014 4:36 PM Performed by: Lowella Dell Pre-anesthesia Checklist: Timeout performed, Patient identified, Emergency Drugs available, Suction available and Patient being monitored Patient Re-evaluated:Patient Re-evaluated prior to inductionOxygen Delivery Method: Circle system utilized Preoxygenation: Pre-oxygenation with 100% oxygen Intubation Type: IV induction Ventilation: Mask  ventilation without difficulty LMA: LMA inserted LMA Size: 4.0 Number of attempts: 1 Tube secured with: Tape Dental Injury: Teeth and Oropharynx as per pre-operative assessment  Comments: LMA inserted by Rebekah Chesterfield CRNA

## 2014-11-03 NOTE — H&P (Signed)
[name] is an 69 y.o. female.   Chief Complaint: Right wrist fracture plan ORIF HPI: Patient presents with a right wrist fracture we're planning open reduction internal fixation is necessary.  She denies other complaints.  She denies neck back chest abdominal pain.  All questions have been encouraged and answered and addressed.  We'll proceed accordingly with surgical algorithm as discussed with the patient  Past Medical History  Diagnosis Date  . Depression   . Hay fever     allergies  . Colon polyps   . Anxiety     aniety attacks  . History of kidney stones   . Migraines     not in 40 years  . Bronchitis     Past Surgical History  Procedure Laterality Date  . Caesarean secion  1974, 1977, 1984  . Breast biopsy  1990  . Cesarean section  [phone]  . Colonoscopy w/ polypectomy      3     Family History  Problem Relation Age of Onset  . Arthritis    . Stroke    . Hypertension    . Depression    . Migraines    . Colon polyps     Social History:  reports that she quit smoking about 4 years ago. Her smoking use included Cigarettes. She has a 75 pack-year smoking history. She has never used smokeless tobacco. She reports that she drinks alcohol. She reports that she does not use illicit drugs.  Allergies:  Allergies  Allergen Reactions  . Aspirin     REACTION: vasculitis    Medications Prior to Admission  Medication Sig Dispense Refill  . acetaminophen (TYLENOL) 500 MG tablet Take 1,000 mg by mouth every 6 (six) hours as needed.    Marland Kitchen FLUoxetine (PROZAC) 20 MG capsule TAKE 1 CAPSULE BY MOUTH EVERY DAY 30 capsule 6  . FLUoxetine (PROZAC) 20 MG capsule TAKE ONE CAPSULE BY MOUTH EVERY DAY (Patient not taking: Reported on 11/02/2014) 90 capsule 1    Results for orders placed or performed during the hospital encounter of 11/03/14 (from the past 48 hour(s))  CBC     Status: None   Collection Time: 11/03/14  2:56 PM  Result Value Ref Range   WBC 5.2  4.0 - 10.5 K/uL   RBC 4.35 3.87 - 5.11 MIL/uL   Hemoglobin 13.4 12.0 - 15.0 g/dL   HCT 41.0 36.0 - 46.0 %   MCV 94.3 78.0 - 100.0 fL   MCH 30.8 26.0 - 34.0 pg   MCHC 32.7 30.0 - 36.0 g/dL   RDW 13.6 11.5 - 15.5 %   Platelets 268 150 - 400 K/uL   Ct Wrist Right Wo Contrast  11/02/2014   CLINICAL DATA:  Right wrist pain. Status post fall on right hand. Further evaluation of known fracture requested. Initial encounter.  EXAM: CT OF THE RIGHT WRIST WITHOUT CONTRAST  TECHNIQUE: Multidetector CT imaging of the right wrist was performed according to the standard protocol. Multiplanar CT image reconstructions were also generated.  COMPARISON:  None.  FINDINGS: There is a comminuted fracture involving the distal radial metaphysis, with mild impaction. Multiple displaced fragments are seen, the largest of which are two volar fragments, which demonstrate volar and proximal displacement. There is up to 5 mm of step-off at the articular surface. Slight volar angulation is noted with regard to dorsal fragments. Surrounding soft tissue swelling is noted.  The ulnar styloid remains intact. The carpal rows appear grossly intact, and demonstrate  grossly normal alignment. There is minimal degenerative change at the first carpometacarpal joint, without significant joint space narrowing.  There is no evidence of vascular compromise, though the vasculature is difficult to fully assess without contrast. Visualized flexor and extensor tendons are unremarkable in appearance. The carpal tunnel is grossly unremarkable in appearance. The visualized musculature appears grossly unremarkable.  IMPRESSION: 1. Comminuted fracture involving the distal radial metaphysis, with mild impaction. Multiple displaced fragments seen, the largest of which are two volar fragments, which demonstrate volar and proximal displacement. Up to 5 mm of step-off noted at the articular surface. Slight volar angulation noted with regard to dorsal fragments. 2.  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   Electronically Signed   By: Garald Balding M.D.   On: 11/02/2014 18:39   Ct 3d Independent Darreld Mclean  11/02/2014   CLINICAL DATA:  Right wrist pain. Status post fall on right hand. Further evaluation of known fracture requested. Initial encounter.  EXAM: CT OF THE RIGHT WRIST WITHOUT CONTRAST  TECHNIQUE: Multidetector CT imaging of the right wrist was performed according to the standard protocol. Multiplanar CT image reconstructions were also generated.  COMPARISON:  None.  FINDINGS: There is a comminuted fracture involving the distal radial metaphysis, with mild impaction. Multiple displaced fragments are seen, the largest of which are two volar fragments, which demonstrate volar and proximal displacement. There is up to 5 mm of step-off at the articular surface. Slight volar angulation is noted with regard to dorsal fragments. Surrounding soft tissue swelling is noted.  The ulnar styloid remains intact. The carpal rows appear grossly intact, and demonstrate grossly normal alignment. There is minimal degenerative change at the first carpometacarpal joint, without significant joint space narrowing.  There is no evidence of vascular compromise, though the vasculature is difficult to fully assess without contrast. Visualized flexor and extensor tendons are unremarkable in appearance. The carpal tunnel is grossly unremarkable in appearance. The visualized musculature appears grossly unremarkable.  IMPRESSION: 1. Comminuted fracture involving the distal radial metaphysis, with mild impaction. Multiple displaced fragments seen, the largest of which are two volar fragments, which demonstrate volar and proximal displacement. Up to 5 mm of step-off noted at the articular surface. Slight volar angulation noted with regard to dorsal fragments. 2. Minimal degenerative change at the first carpometacarpal joint, likely  reflecting very mild osteoarthritis.   Electronically Signed   By: Garald Balding M.D.   On: 11/02/2014 18:39    Review of Systems  Eyes: Negative.   Respiratory: Negative.   Gastrointestinal: Negative.   Genitourinary: Negative.   Neurological: Negative.   Endo/Heme/Allergies: Negative.     Blood pressure 138/72, pulse 64, temperature 98.1 F (36.7 C), temperature source Oral, resp. rate 18, height 5\' 7"  (1.702 m), weight 65.772 kg (145 lb), SpO2 94 %. Physical Exam  Closed right wrist fracture neurovascular intact at present time without signs of infection or dystrophic reaction The patient is alert and oriented in no acute distress. The patient complains of pain in the affected upper extremity.  The patient is noted to have a normal HEENT exam. Lung fields show equal chest expansion and no shortness of breath. Abdomen exam is nontender without distention. Lower extremity examination does not show any fracture dislocation or blood clot symptoms. Pelvis is stable and the neck and back are stable and nontender. Assessment/Plan Right wrist fracture plan open reduction internal fixation.  We are planning surgery for your upper extremity. The risk and benefits  of surgery to include risk of bleeding, infection, anesthesia,  damage to normal structures and failure of the surgery to accomplish its intended goals of relieving symptoms and restoring function have been discussed in detail. With this in mind we plan to proceed. I have specifically discussed with the patient the pre-and postoperative regime and the dos and don'ts and risk and benefits in great detail. Risk and benefits of surgery also include risk of dystrophy(CRPS), chronic nerve pain, failure of the healing process to go onto completion and other inherent risks of surgery The relavent the pathophysiology of the disease/injury process, as well as the alternatives for treatment and postoperative course of action has been discussed in  great detail with the patient who desires to proceed.  We will do everything in our power to help you (the patient) restore function to the upper extremity. It is a pleasure to see this patient today.   Paulene Floor 11/03/2014, 3:56 PM

## 2014-11-04 ENCOUNTER — Encounter (HOSPITAL_COMMUNITY): Payer: Self-pay | Admitting: Orthopedic Surgery

## 2014-11-04 DIAGNOSIS — S52571A Other intraarticular fracture of lower end of right radius, initial encounter for closed fracture: Secondary | ICD-10-CM | POA: Diagnosis not present

## 2014-11-04 MED ORDER — HYDROMORPHONE HCL 2 MG PO TABS: 2.0000 mg | ORAL_TABLET | ORAL | Status: DC | PRN

## 2014-11-04 MED ORDER — HYDROMORPHONE HCL 2 MG PO TABS
4.0000 mg | ORAL_TABLET | ORAL | Status: DC | PRN
  Administered 2014-11-04 (×4): 4 mg via ORAL
  Filled 2014-11-04 (×4): qty 2

## 2014-11-04 MED ORDER — MORPHINE SULFATE 4 MG/ML IJ SOLN
4.0000 mg | INTRAMUSCULAR | Status: DC | PRN
  Administered 2014-11-04 (×3): 4 mg via INTRAVENOUS
  Filled 2014-11-04 (×3): qty 1

## 2014-11-04 NOTE — Discharge Instructions (Signed)
.  Keep bandage clean and dry.  Call for any problems.  No smoking.  Criteria for driving a car: you should be off your pain medicine for 7-8 hours, able to drive one handed(confident), thinking clearly and feeling able in your judgement to drive. Continue elevation as it will decrease swelling.  If instructed by MD move your fingers within the confines of the bandage/splint.  Use ice if instructed by your MD. Call immediately for any sudden loss of feeling in your hand/arm or change in functional abilities of the extremity. .We recommend that you to take vitamin C 1000 mg a day to promote healing. We also recommend that if you require  pain medicine that you take a stool softener to prevent constipation as most pain medicines will have constipation side effects. We recommend either Peri-Colace or Senokot and recommend that you also consider adding MiraLAX to prevent the constipation affects from pain medicine if you are required to use them. These medicines are over the counter and maybe purchased at a local pharmacy. A cup of yogurt and a probiotic can also be helpful during the recovery process as the medicines can disrupt your intestinal environment.

## 2014-11-04 NOTE — Op Note (Signed)
[name], SOLER NO.:  [id number]  MEDICAL RECORD NO.:  [id number]  LOCATION:  6N05C                        FACILITY:  [facility]  PHYSICIAN:  Satira Anis. Shatina Streets, M.D.DATE OF BIRTH:  05-Jul-1945  DATE OF PROCEDURE: DATE OF DISCHARGE:                              OPERATIVE REPORT   PREOPERATIVE DIAGNOSIS:  Comminuted complex volar Barton's equivalent- type fracture, intra-articular greater than 3-5-part right distal radius fracture.  POSTOPERATIVE DIAGNOSIS:  Comminuted complex volar Barton's equivalent- type fracture, intra-articular greater than 3-5-part right distal radius fracture.  PROCEDURES: 1. Open reduction and internal fixation of distal radius fracture     greater than 5-part fracture intra-articular utilizing Staygraft     bone graft and an AcuMed narrow distal plate and screw construct. 2. AP, lateral and oblique x-rays performed, examined, and interpreted     by myself.  SURGEON:  Satira Anis. Amedeo Plenty, M.D.  ASSISTANT:  Avelina Laine, P.A.-C.  COMPLICATIONS:  None.  ANESTHESIA:  General, preoperative block.  TOURNIQUET TIME:  Less than an hour.  DRAINS:  One.  INDICATIONS:  This patient is a 69 year old female, who was injured on a cruise, re-presented to [facility] after the cruise was complete for definitive care.  We have counseled her in regard to risks and benefits of surgery and she desires to proceed.  OPERATIVE PROCEDURE:  The patient was seen by myself and Anesthesia, taken to the operative suite and underwent smooth induction of general anesthesia.  Preoperative block was placed.  Arm had been marked, consent signed.  All questions were encouraged and answered.  Following pre-scrub, she then underwent a Betadine scrub and paint followed by time-out being called.  Curvilinear incision was made volar radially. Dissection was carried down.  FCR tendon sheath was incised dorsally and palmarly.  Fasciotomy performed.  Pronator was  incised in L-shaped fashion.  The complex fracture was then reassembled.  I placed Staygraft allograft in the dorsal defect and then reconstructed the intra- articular fragments.  Following this, I placed a narrow extended AcuMed distal radius plate.  I was able to achieve adequate height, inclination and volar tilt to my satisfaction and restored geometry including distal radioulnar joint anatomy and motion.  Plate was applied in a standard fashion.  There were no complicating features, good fixation.  Live x-ray; AP, lateral and oblique as well as fluoro showed excellent position, recreation of the geometry to my satisfaction, no complicating features.  Following this, distal radioulnar joint was evaluated as was the radiocarpal and midcarpal joints just looked excellent.  I was pleased with this and the findings.  Following this, we then performed irrigation.  Pronator was closed.  I had a nice tight distal closure, which was satisfactory.  Following this, TLS drain was placed.  Tourniquet deflated of course and wound was closed with Prolene.  Short arm splint was applied.  She tolerated this well.  She will be taken to recovery room, admitted overnight for IV antibiotics.  Notify us should any problems occur.  Do's and don'ts have been discussed and all questions have been encouraged and answered.  We will rehab her according to standard protocol for a distal radius ORIF.     Satira Anis.  Amedeo Plenty, M.D.     [facility]  D:  11/03/2014  T:  11/04/2014  Job:  [id number]

## 2014-11-04 NOTE — Progress Notes (Signed)
Occupational Therapy Evaluation Patient Details Name: [name] MRN: [id number] DOB: [birth year] Today's Date: 11/04/2014    History of Present Illness pt presents with R Distal radius fx post ORIF.     Clinical Impression   Pt presents with above diagnosis and seen for acute OT eval. PTA pt was independent with ADLs. Pt currently at set up to min guard level with ADLs. Pt will have assist at home. ADLs completed and education provided as detailed below. No further acute OT needs indicated at this time.     Follow Up Recommendations  Supervision - Intermittent;Other (comment);No OT follow up (OOB/mobility)    Equipment Recommendations  None recommended by OT;Other (comment) (pt declined)    Recommendations for Other Services       Precautions / Restrictions Precautions Precautions: None Precaution Comments: Discussed possible use of sling for support during ambulation.   Restrictions Weight Bearing Restrictions: Yes RUE Weight Bearing: Weight bear through elbow only      Mobility Bed Mobility Overal bed mobility: Modified Independent             General bed mobility comments: used bed rails  Transfers Overall transfer level: Modified independent Equipment used: None             General transfer comment: used bed rails on lt side to assit with sit<>stand    Balance Overall balance assessment: Modified Independent                                          ADL Overall ADL's : Needs assistance/impaired Eating/Feeding: Set up;Sitting   Grooming: Set up;Sitting   Upper Body Bathing: Set up;Sitting   Lower Body Bathing: Min guard;Sit to/from stand   Upper Body Dressing : Set up;Sitting   Lower Body Dressing: Min guard;Sit to/from stand   Toilet Transfer: Supervision/safety;Ambulation;Regular Toilet;Grab bars Toilet Transfer Details (indicate cue type and reason): pt used grab bars  Toileting- Clothing Manipulation and Hygiene:  Supervision/safety;Sit to/from stand   Tub/ Shower Transfer: Supervision/safety;Min guard;Ambulation;Shower seat   Functional mobility during ADLs: Supervision/safety General ADL Comments: Pt completed bed mobility and transfers as detailed above. Noted pt use of external support on left side for sit<>stand transfers (bed rail, grab bar). Discussed 3n1 equipment recommendation with pt declining. Discussed alternate strategies to steady balance during toilet/shower transfers as pt has no grab bars. ADL and edema control educaiton provided. Discussed use of sling. Educated on edema control measures.      Vision     Perception     Praxis      Pertinent Vitals/Pain Pain Assessment: Faces Pain Score: 5  Faces Pain Scale: Hurts little more Pain Location: Rt wrist Pain Descriptors / Indicators: Aching Pain Intervention(s): Monitored during session;Repositioned;RN gave pain meds during session;Ice applied     Hand Dominance Right   Extremity/Trunk Assessment Upper Extremity Assessment Upper Extremity Assessment: RUE deficits/detail RUE Deficits / Details: R Distal radius fx post ORIF   Lower Extremity Assessment Lower Extremity Assessment: Defer to PT evaluation   Cervical / Trunk Assessment Cervical / Trunk Assessment: Normal   Communication Communication Communication: No difficulties   Cognition Arousal/Alertness: Awake/alert Behavior During Therapy: WFL for tasks assessed/performed Overall Cognitive Status: Within Functional Limits for tasks assessed                     General Comments  Exercises       Shoulder Instructions      Home Living Family/patient expects to be discharged to:: Private residence Living Arrangements: Spouse/significant other Available Help at Discharge: Family;Available PRN/intermittently Type of Home: House Home Access: Stairs to enter [employer] of Steps: 4 Entrance Stairs-Rails: Right Home Layout: One level      Bathroom Shower/Tub: Occupational psychologist: Standard     Home Equipment: None   Additional Comments: Discussed using toilet at home that has a sink nearby on left side. Discussed spouse assisting with sit<>stand shower transfer. Pt declined 3n1.       Prior Functioning/Environment Level of Independence: Independent             OT Diagnosis:     OT Problem List:     OT Treatment/Interventions:      OT Goals(Current goals can be found in the care plan section) Acute Rehab OT Goals Patient Stated Goal: Home today  OT Frequency:     Barriers to D/C:            Co-evaluation              End of Session    Activity Tolerance: Patient tolerated treatment well Patient left: in bed;with call bell/phone within reach   Time: 0953-1009 OT Time Calculation (min): 16 min Charges:  OT General Charges $OT Visit: 1 Procedure OT Evaluation $Initial OT Evaluation Tier I: 1 Procedure G-Codes: OT G-codes **NOT FOR INPATIENT CLASS** Functional Assessment Tool Used: clinical observation Functional Limitation: Self care Self Care Current Status (Z0017): At least 1 percent but less than 20 percent impaired, limited or restricted Self Care Goal Status (C9449): At least 1 percent but less than 20 percent impaired, limited or restricted Self Care Discharge Status [phone]): At least 1 percent but less than 20 percent impaired, limited or restricted  [name] 11/04/2014, 10:42 AM

## 2014-11-04 NOTE — Progress Notes (Signed)
Pt discharged to home.  Discharge instructions explained to pt.  IV removed.  Patient has no questions at time of discharge.  [place] Nurse, RN

## 2014-11-04 NOTE — Discharge Summary (Signed)
Physician Discharge Summary  Patient ID: [name] MRN: [id number] DOB/AGE: [birth year] 69 y.o.  Admit date: 11/03/2014 Discharge date: 11/04/2014  Admission Diagnoses: Status post displaced right distal radius fracture Patient Active Problem List   Diagnosis Date Noted  . Distal radius fracture, right 11/03/2014  . Acute bronchitis 06/14/2011  . 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 HEMATURIA UNSPECIFIED 11/03/2009  . HYPERLIPIDEMIA 09/23/2009  . PERS HX TOBACCO USE PRESENTING HAZARDS HEALTH 09/23/2009  . OSTEOPENIA 10/05/2008  . DEPRESSION 09/17/2008   Discharge Diagnoses: Same, improved status post open reduction internal fixation right distal radius fracture Active Problems:   Distal radius fracture, right  Patient Active Problem List   Diagnosis Date Noted  . Distal radius fracture, right 11/03/2014  . Acute bronchitis 06/14/2011  . 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 HEMATURIA UNSPECIFIED 11/03/2009  . HYPERLIPIDEMIA 09/23/2009  . PERS HX TOBACCO USE PRESENTING HAZARDS HEALTH 09/23/2009  . OSTEOPENIA 10/05/2008  . DEPRESSION 09/17/2008    Discharged Condition: Stable  Hospital Course: The patient's a pleasant 69 year old female who is admitted for surgical intervention in regards to her right upper extremity. The patient sustained a right distal radius fracture with notable displacement. She was seen and evaluated in our office setting and was noted to have a placed distal radius fracture, I should note she has a history of a prior distal radius fracture several years ago that did go onto heal up with some degree of ulnar positive variance as we have pointed out to her in her radiographs. Unfortunately she sustained a recent injury resulting in a displaced distal radius fracture. After reviewing her scans we have discussed with her at recommendation for surgical intervention. The patient underwent open reduction internal fixation right distal radius fracture without difficulties,  please see operative report for full details. The patient was admitted for standard IV antibiotics, pain control and close observation. Had difficulties once her preoperative block wore off with significant pain and rebound tenderness and thus was started on the allotted which did improve her overall pain. Postoperative day #1 she was significantly improved terms of her pain. She denied nausea, vomiting, fever or chills. She was tolerating a regular diet. She was switched to hydromorphone by mouth she was tolerating well and worked well for her pain control. Decision was made to discharge her home, we discussed with her taking hydromorphone instead of oxycodone which was initially given. Description for hydromorphone will be supplied. She will continue her Robaxin as needed in addition we have discussed with her once again merits of vitamin C, vitamin A and have discussed with her at length all discharge issues. We have went over with her the need for elevation, edema control, diligent finger range of motion. We have discussed with her keeping her splint clean dry and intact. We will need to see her in our office setting in 2 weeks for postop follow-up, at that juncture we will remove her brace, remove the sutures, repeat radiographs and proceed with casting.  Consults: None   Treatments: See operative report for details  Discharge Exam: Blood pressure 116/68, pulse 64, temperature 98.1 F (36.7 C), temperature source Oral, resp. rate 14, height 5' 7.01" (1.702 m), weight 68.901 kg (151 lb 14.4 oz), SpO2 99 %. Examination of the right upper extremity: The drain is removed without difficulties, she has mild swelling about the dorsal aspect of the hand, sensation refill are intact. She does actively move the fingers without significant difficulties with only minimal tenderness. No signs of dystrophy no signs of  infection .The patient is alert and oriented in no acute distress. The patient complains of  pain in the affected upper extremity.  The patient is noted to have a normal HEENT exam. Lung fields show equal chest expansion and no shortness of breath. Abdomen exam is nontender without distention. Lower extremity examination does not show any fracture dislocation or blood clot symptoms. Pelvis is stable and the neck and back are stable and nontender.  Disposition: 01-Home or Self Care  Discharge Instructions    Call MD / Call 911    Complete by:  As directed   If you experience chest pain or shortness of breath, CALL 911 and be transported to the hospital emergency room.  If you develope a fever above 101 F, pus (white drainage) or increased drainage or redness at the wound, or calf pain, call your surgeon's office.     Constipation Prevention    Complete by:  As directed   Drink plenty of fluids.  Prune juice may be helpful.  You may use a stool softener, such as Colace (over the counter) 100 mg twice a day.  Use MiraLax (over the counter) for constipation as needed.     Diet - low sodium heart healthy    Complete by:  As directed      Discharge instructions    Complete by:  As directed   .Keep bandage clean and dry.  Call for any problems.  No smoking.  Criteria for driving a car: you should be off your pain medicine for 7-8 hours, able to drive one handed(confident), thinking clearly and feeling able in your judgement to drive. Continue elevation as it will decrease swelling.  If instructed by MD move your fingers within the confines of the bandage/splint.  Use ice if instructed by your MD. Call immediately for any sudden loss of feeling in your hand/arm or change in functional abilities of the extremity. .We recommend that you to take vitamin C 1000 mg a day to promote healing. We also recommend that if you require  pain medicine that you take a stool softener to prevent constipation as most pain medicines will have constipation side effects. We recommend either Peri-Colace or Senokot  and recommend that you also consider adding MiraLAX to prevent the constipation affects from pain medicine if you are required to use them. These medicines are over the counter and maybe purchased at a local pharmacy. A cup of yogurt and a probiotic can also be helpful during the recovery process as the medicines can disrupt your intestinal environment.     Increase activity slowly as tolerated    Complete by:  As directed             Medication List    TAKE these medications        acetaminophen 500 MG tablet  Commonly known as:  TYLENOL  Take 1,000 mg by mouth every 6 (six) hours as needed.     FLUoxetine 20 MG capsule  Commonly known as:  PROZAC  TAKE 1 CAPSULE BY MOUTH EVERY DAY     FLUoxetine 20 MG capsule  Commonly known as:  PROZAC  TAKE ONE CAPSULE BY MOUTH EVERY DAY     HYDROmorphone 2 MG tablet  Commonly known as:  DILAUDID  Take 1 tablet (2 mg total) by mouth every 4 (four) hours as needed for severe pain (1 to 2 every 4 to 6 hours as needed for pain).  Follow-up Information    Follow up with Paulene Floor, MD.   Specialty:  Orthopedic Surgery   Why:  our office will contact you with appt dates and times   Contact information:   [address] [place] [address] [phone]       Signed: Ivan Croft 11/04/2014, 6:14 PM

## 2014-11-04 NOTE — Evaluation (Signed)
Physical Therapy Evaluation Patient Details Name: [name] MRN: [id number] DOB: [birth year] Today's Date: 11/04/2014   History of Present Illness  pt presents with R Distal radius fx post ORIF.    Clinical Impression  Pt moving well and demonstrates good awareness of safety since she states she injured wrist more than a week ago while on a cruise.  Discussed possible use of sling for comfort and support during ambulation as pt used L UE to hold R UE during ambulation.  Encouraged pt to continue to ambulate with staff throughout the day.  No further acute PT needs at this time.  Will sign off.      Follow Up Recommendations No PT follow up;Supervision - Intermittent    Equipment Recommendations  None recommended by PT    Recommendations for Other Services       Precautions / Restrictions Precautions Precautions: None Precaution Comments: Discussed possible use of sling for support during ambulation.   Restrictions Weight Bearing Restrictions: Yes RUE Weight Bearing: Weight bear through elbow only      Mobility  Bed Mobility Overal bed mobility: Independent                Transfers Overall transfer level: Independent Equipment used: None                Ambulation/Gait Ambulation/Gait assistance: Independent Ambulation Distance (Feet): 500 Feet Assistive device: None Gait Pattern/deviations: WFL(Within Functional Limits)        Stairs            Wheelchair Mobility    Modified Rankin (Stroke Patients Only)       Balance Overall balance assessment: Independent                                           Pertinent Vitals/Pain Pain Assessment: 0-10 Pain Score: 5  Pain Location: R wrist Pain Descriptors / Indicators: Aching Pain Intervention(s): Monitored during session;Premedicated before session;Repositioned    Home Living Family/patient expects to be discharged to:: Private residence Living Arrangements:  Spouse/significant other Available Help at Discharge: Family;Available PRN/intermittently Type of Home: House Home Access: Stairs to enter Entrance Stairs-Rails: Right Entrance Stairs-Number of Steps: 4 Home Layout: One level Home Equipment: None      Prior Function Level of Independence: Independent               Hand Dominance   Dominant Hand: Right    Extremity/Trunk Assessment   Upper Extremity Assessment: Defer to OT evaluation           Lower Extremity Assessment: Overall WFL for tasks assessed      Cervical / Trunk Assessment: Normal  Communication   Communication: No difficulties  Cognition Arousal/Alertness: Awake/alert Behavior During Therapy: WFL for tasks assessed/performed Overall Cognitive Status: Within Functional Limits for tasks assessed                      General Comments      Exercises        Assessment/Plan    PT Assessment Patent does not need any further PT services  PT Diagnosis Difficulty walking   PT Problem List    PT Treatment Interventions     PT Goals (Current goals can be found in the Care Plan section) Acute Rehab PT Goals Patient Stated Goal: Home today PT Goal Formulation: All assessment and  education complete, DC therapy    Frequency     Barriers to discharge        Co-evaluation               End of Session   Activity Tolerance: Patient tolerated treatment well Patient left: in bed;with call bell/phone within reach Nurse Communication: Mobility status    Functional Assessment Tool Used: Clinical Judgement Functional Limitation: Mobility: Walking and moving around Mobility: Walking and Moving Around Current Status (E7209): 0 percent impaired, limited or restricted Mobility: Walking and Moving Around Goal Status [phone]): 0 percent impaired, limited or restricted Mobility: Walking and Moving Around Discharge Status [phone]): 0 percent impaired, limited or restricted    Time:  0916-0928 PT Time Calculation (min) (ACUTE ONLY): 12 min   Charges:   PT Evaluation $Initial PT Evaluation Tier I: 1 Procedure     PT G Codes:   PT G-Codes **NOT FOR INPATIENT CLASS** Functional Assessment Tool Used: Clinical Judgement Functional Limitation: Mobility: Walking and moving around Mobility: Walking and Moving Around Current Status (Q9476): 0 percent impaired, limited or restricted Mobility: Walking and Moving Around Goal Status (L4650): 0 percent impaired, limited or restricted Mobility: Walking and Moving Around Discharge Status (P5465): 0 percent impaired, limited or restricted    [name], [place] 11/04/2014, 9:40 AM

## 2015-04-05 ENCOUNTER — Other Ambulatory Visit: Payer: Self-pay

## 2015-04-05 DIAGNOSIS — Z1231 Encounter for screening mammogram for malignant neoplasm of breast: Secondary | ICD-10-CM

## 2015-04-15 ENCOUNTER — Other Ambulatory Visit: Payer: Self-pay | Admitting: Family Medicine

## 2015-05-03 ENCOUNTER — Ambulatory Visit (INDEPENDENT_AMBULATORY_CARE_PROVIDER_SITE_OTHER): Payer: 59 | Admitting: Family Medicine

## 2015-05-03 VITALS — BP 102/70 | HR 73 | Temp 97.3°F | Resp 16 | Ht 67.0 in | Wt 151.9 lb

## 2015-05-03 DIAGNOSIS — F5102 Adjustment insomnia: Secondary | ICD-10-CM | POA: Diagnosis not present

## 2015-05-03 DIAGNOSIS — Z23 Encounter for immunization: Secondary | ICD-10-CM

## 2015-05-03 DIAGNOSIS — Z8659 Personal history of other mental and behavioral disorders: Secondary | ICD-10-CM

## 2015-05-03 MED ORDER — ZOLPIDEM TARTRATE 10 MG PO TABS: 10.0000 mg | ORAL_TABLET | Freq: Every evening | ORAL | Status: DC | PRN

## 2015-05-03 MED ORDER — FLUOXETINE HCL 20 MG PO CAPS: 20.0000 mg | ORAL_CAPSULE | Freq: Every day | ORAL | Status: DC

## 2015-05-03 NOTE — Progress Notes (Signed)
Pre visit review using our clinic review tool, if applicable. No additional management support is needed unless otherwise documented below in the visit note. 

## 2015-05-03 NOTE — Progress Notes (Signed)
   Subjective:    Patient ID: Floyce Stakes, female    DOB: [birth year], 69 y.o.   MRN: [id number]  HPI History of depression. She takes fluoxetine 20 mg daily and has been on this for several years. This continues to work well for her. She is reluctant to stop. She has history of transient insomnia related to travels. She has several upcoming trips mostly to [place]. She has taken this with good success the past and requesting refill. She does not require anything for sleep on a regular basis. She does not have any history of documented hepatitis A vaccine.  Past Medical History  Diagnosis Date  . Depression   . Hay fever     allergies  . Colon polyps   . Anxiety     aniety attacks  . History of kidney stones   . Migraines     not in 40 years  . Bronchitis    Past Surgical History  Procedure Laterality Date  . Caesarean secion  1974, 1977, 1984  . Breast biopsy  1990  . Cesarean section  [phone]  . Colonoscopy w/ polypectomy      3   . Open reduction internal fixation (orif) distal radial fracture Right 11/03/2014    Procedure: OPEN REDUCTION INTERNAL FIXATION (ORIF) RIGHT DISTAL RADIAL FRACTURE WITH REPAIR AND RECONSTRUCTION;  Surgeon: Roseanne Kaufman, MD;  Location: [facility];  Service: Orthopedics;  Laterality: Right;    reports that she quit smoking about 4 years ago. Her smoking use included Cigarettes. She has a 75 pack-year smoking history. She has never used smokeless tobacco. She reports that she drinks alcohol. She reports that she does not use illicit drugs. family history includes Arthritis in an other family member; Colon polyps in an other family member; Depression in an other family member; Hypertension in an other family member; Migraines in an other family member; Stroke in an other family member. Allergies  Allergen Reactions  . Aspirin     REACTION: vasculitis       Review of Systems  Constitutional: Negative for fever and chills.  Respiratory: Negative  for cough and shortness of breath.   Cardiovascular: Negative for chest pain.       Objective:   Physical Exam  Constitutional: She appears well-developed and well-nourished.  Neck: Neck supple. No thyromegaly present.  Cardiovascular: Normal rate and regular rhythm.   Pulmonary/Chest: Effort normal and breath sounds normal. No respiratory distress. She has no wheezes. She has no rales.  Musculoskeletal: She exhibits no edema.  Lymphadenopathy:    She has no cervical adenopathy.          Assessment & Plan:  #1 history of depression. Stable. Refill fluoxetine for one year #2 transient insomnia related to travel. Refill limited Ambien 10 mg 1 daily at bedtime when necessary #20 #3 travel advice encounter. Recommend hepatitis A with her frequent travels. Consider booster in 6 months

## 2015-05-17 ENCOUNTER — Ambulatory Visit
Admission: RE | Admit: 2015-05-17 | Discharge: 2015-05-17 | Disposition: A | Discharge status: Home / Self Care | Payer: 59 | Source: Ambulatory Visit

## 2015-05-17 DIAGNOSIS — Z1231 Encounter for screening mammogram for malignant neoplasm of breast: Secondary | ICD-10-CM

## 2015-08-12 ENCOUNTER — Other Ambulatory Visit: Payer: Self-pay | Admitting: Family Medicine

## 2015-08-30 ENCOUNTER — Encounter: Payer: Self-pay | Admitting: Adult Health

## 2015-08-30 ENCOUNTER — Ambulatory Visit (INDEPENDENT_AMBULATORY_CARE_PROVIDER_SITE_OTHER): Payer: 59 | Admitting: Adult Health

## 2015-08-30 VITALS — BP 102/60 | Temp 98.0°F | Wt 154.0 lb

## 2015-08-30 DIAGNOSIS — J209 Acute bronchitis, unspecified: Secondary | ICD-10-CM

## 2015-08-30 MED ORDER — BENZONATATE 200 MG PO CAPS: 200.0000 mg | ORAL_CAPSULE | Freq: Two times a day (BID) | ORAL | Status: DC | PRN

## 2015-08-30 MED ORDER — METHYLPREDNISOLONE 4 MG PO TBPK: ORAL_TABLET | ORAL | Status: DC

## 2015-08-30 NOTE — Progress Notes (Signed)
Subjective:    Patient ID: [name], female    DOB: 1946-01-10, 70 y.o.   MRN: [id number]  HPI  70 year old female who presents to the office today for one week of sore throat and  Dry coughing. She has coughing spells that are so bad that she vomits. Also complains of pain in her back and ribs from coughing.  The cough is worse in the morning  She denies any fever, n/v/d.  She has been using halls cough drops without relief.   Review of Systems  Constitutional: Positive for activity change. Negative for fever, chills, diaphoresis, appetite change and fatigue.  HENT: Negative.   Respiratory: Positive for cough. Negative for shortness of breath and wheezing.   Cardiovascular: Negative.   Skin: Negative.   Neurological: Negative.   Psychiatric/Behavioral: Negative for sleep disturbance.  All other systems reviewed and are negative.  Past Medical History  Diagnosis Date  . Depression   . Hay fever     allergies  . Colon polyps   . Anxiety     aniety attacks  . History of kidney stones   . Migraines     not in 40 years  . Bronchitis     Social History   Social History  . Marital Status: Married    Spouse Name: N/A  . Number of Children: N/A  . Years of Education: N/A   Occupational History  . Not on file.   Social History Main Topics  . Smoking status: Former Smoker -- 1.50 packs/day for 50 years    Types: Cigarettes    Quit date: 06/18/2010  . Smokeless tobacco: Never Used  . Alcohol Use: Yes     Comment: glass of wine daily  . Drug Use: No  . Sexual Activity: Not on file   Other Topics Concern  . Not on file   Social History Narrative    Past Surgical History  Procedure Laterality Date  . Caesarean secion  1974, 1977, 1984  . Breast biopsy  1990  . Cesarean section  [phone]  . Colonoscopy w/ polypectomy      3   . Open reduction internal fixation (orif) distal radial fracture Right 11/03/2014    Procedure: OPEN REDUCTION INTERNAL  FIXATION (ORIF) RIGHT DISTAL RADIAL FRACTURE WITH REPAIR AND RECONSTRUCTION;  Surgeon: Roseanne Kaufman, MD;  Location: [facility];  Service: Orthopedics;  Laterality: Right;    Family History  Problem Relation Age of Onset  . Arthritis    . Stroke    . Hypertension    . Depression    . Migraines    . Colon polyps      Allergies  Allergen Reactions  . Aspirin     REACTION: vasculitis    Current Outpatient Prescriptions on File Prior to Visit  Medication Sig Dispense Refill  . FLUoxetine (PROZAC) 20 MG capsule TAKE 1 CAPSULE BY MOUTH EVERY DAY 90 capsule 1   No current facility-administered medications on file prior to visit.    BP 102/60 mmHg  Temp(Src) 98 F (36.7 C) (Oral)  Wt 154 lb (69.854 kg)       Objective:   Physical Exam  Constitutional: She is oriented to person, place, and time. She appears well-developed and well-nourished. No distress.  HENT:  Head: Normocephalic and atraumatic.  Right Ear: External ear normal.  Left Ear: External ear normal.  Nose: Nose normal.  Mouth/Throat: Oropharynx is clear and moist. No oropharyngeal exudate.  Neck: Neck supple.  Cardiovascular: Normal rate, regular rhythm, normal heart sounds and intact distal pulses.  Exam reveals no gallop and no friction rub.   No murmur heard. Pulmonary/Chest: Effort normal and breath sounds normal. No respiratory distress. She has no wheezes. She has no rales. She exhibits no tenderness.  Lymphadenopathy:    She has no cervical adenopathy.  Neurological: She is alert and oriented to person, place, and time.  Skin: Skin is warm and dry. No rash noted. She is not diaphoretic. No erythema. No pallor.  Psychiatric: She has a normal mood and affect. Her behavior is normal. Thought content normal.  Nursing note and vitals reviewed.     Assessment & Plan:  1. Acute bronchitis, unspecified organism - benzonatate (TESSALON) 200 MG capsule; Take 1 capsule (200 mg total) by mouth 2 (two) times daily as  needed for cough.  Dispense: 20 capsule; Refill: 2 - methylPREDNISolone (MEDROL DOSEPAK) 4 MG TBPK tablet; Take as directed  Dispense: 21 tablet; Refill: 0 - Follow up if no improvement.

## 2015-08-30 NOTE — Patient Instructions (Addendum)
It was great meeting you today!  Your exam is consistent with bronchitis.   I have sent in a prescription for Prednisone and Tessalon Pearls.   Follow up if no improvement.    Acute Bronchitis Bronchitis is inflammation of the airways that extend from the windpipe into the lungs (bronchi). The inflammation often causes mucus to develop. This leads to a cough, which is the most common symptom of bronchitis.  In acute bronchitis, the condition usually develops suddenly and goes away over time, usually in a couple weeks. Smoking, allergies, and asthma can make bronchitis worse. Repeated episodes of bronchitis may cause further lung problems.  CAUSES Acute bronchitis is most often caused by the same virus that causes a cold. The virus can spread from person to person (contagious) through coughing, sneezing, and touching contaminated objects. SIGNS AND SYMPTOMS   Cough.   Fever.   Coughing up mucus.   Body aches.   Chest congestion.   Chills.   Shortness of breath.   Sore throat.  DIAGNOSIS  Acute bronchitis is usually diagnosed through a physical exam. Your health care provider will also ask you questions about your medical history. Tests, such as chest X-rays, are sometimes done to rule out other conditions.  TREATMENT  Acute bronchitis usually goes away in a couple weeks. Oftentimes, no medical treatment is necessary. Medicines are sometimes given for relief of fever or cough. Antibiotic medicines are usually not needed but may be prescribed in certain situations. In some cases, an inhaler may be recommended to help reduce shortness of breath and control the cough. A cool mist vaporizer may also be used to help thin bronchial secretions and make it easier to clear the chest.  HOME CARE INSTRUCTIONS  Get plenty of rest.   Drink enough fluids to keep your urine clear or pale yellow (unless you have a medical condition that requires fluid restriction). Increasing fluids may  help thin your respiratory secretions (sputum) and reduce chest congestion, and it will prevent dehydration.   Take medicines only as directed by your health care provider.  If you were prescribed an antibiotic medicine, finish it all even if you start to feel better.  Avoid smoking and secondhand smoke. Exposure to cigarette smoke or irritating chemicals will make bronchitis worse. If you are a smoker, consider using nicotine gum or skin patches to help control withdrawal symptoms. Quitting smoking will help your lungs heal faster.   Reduce the chances of another bout of acute bronchitis by washing your hands frequently, avoiding people with cold symptoms, and trying not to touch your hands to your mouth, nose, or eyes.   Keep all follow-up visits as directed by your health care provider.  SEEK MEDICAL CARE IF: Your symptoms do not improve after 1 week of treatment.  SEEK IMMEDIATE MEDICAL CARE IF:  You develop an increased fever or chills.   You have chest pain.   You have severe shortness of breath.  You have bloody sputum.   You develop dehydration.  You faint or repeatedly feel like you are going to pass out.  You develop repeated vomiting.  You develop a severe headache. MAKE SURE YOU:   Understand these instructions.  Will watch your condition.  Will get help right away if you are not doing well or get worse.   This information is not intended to replace advice given to you by your health care provider. Make sure you discuss any questions you have with your health care provider.  Document Released: 07/13/2004 Document Revised: 06/26/2014 Document Reviewed: 11/26/2012 Elsevier Interactive Patient Education [employer].

## 2015-08-30 NOTE — Progress Notes (Signed)
Pre visit review using our clinic review tool, if applicable. No additional management support is needed unless otherwise documented below in the visit note. 

## 2016-04-10 ENCOUNTER — Other Ambulatory Visit: Payer: Self-pay | Admitting: Family Medicine

## 2016-04-10 DIAGNOSIS — Z1231 Encounter for screening mammogram for malignant neoplasm of breast: Secondary | ICD-10-CM

## 2016-05-07 ENCOUNTER — Other Ambulatory Visit: Payer: Self-pay | Admitting: Family Medicine

## 2016-05-18 ENCOUNTER — Ambulatory Visit
Admission: RE | Admit: 2016-05-18 | Discharge: 2016-05-18 | Disposition: A | Discharge status: Home / Self Care | Payer: 59 | Source: Ambulatory Visit | Attending: Family Medicine | Admitting: Family Medicine

## 2016-05-18 DIAGNOSIS — Z1231 Encounter for screening mammogram for malignant neoplasm of breast: Secondary | ICD-10-CM

## 2016-09-21 DIAGNOSIS — J189 Pneumonia, unspecified organism: Secondary | ICD-10-CM | POA: Diagnosis not present

## 2016-09-26 ENCOUNTER — Encounter: Payer: Self-pay | Admitting: Family Medicine

## 2016-09-26 ENCOUNTER — Ambulatory Visit (INDEPENDENT_AMBULATORY_CARE_PROVIDER_SITE_OTHER)
Admission: RE | Admit: 2016-09-26 | Discharge: 2016-09-26 | Disposition: A | Discharge status: Home / Self Care | Payer: Medicare Other | Source: Ambulatory Visit | Attending: Family Medicine | Admitting: Family Medicine

## 2016-09-26 ENCOUNTER — Ambulatory Visit (INDEPENDENT_AMBULATORY_CARE_PROVIDER_SITE_OTHER): Payer: Medicare Other | Admitting: Family Medicine

## 2016-09-26 VITALS — BP 110/80 | HR 76 | Temp 98.3°F | Wt 157.6 lb

## 2016-09-26 DIAGNOSIS — R05 Cough: Secondary | ICD-10-CM

## 2016-09-26 DIAGNOSIS — R059 Cough, unspecified: Secondary | ICD-10-CM

## 2016-09-26 DIAGNOSIS — J189 Pneumonia, unspecified organism: Secondary | ICD-10-CM

## 2016-09-26 MED ORDER — HYDROCODONE-HOMATROPINE 5-1.5 MG/5ML PO SYRP: 5.0000 mL | ORAL_SOLUTION | Freq: Four times a day (QID) | ORAL | 0 refills | Status: AC | PRN

## 2016-09-26 MED ORDER — LEVOFLOXACIN 500 MG PO TABS: 500.0000 mg | ORAL_TABLET | Freq: Every day | ORAL | 0 refills | Status: DC

## 2016-09-26 NOTE — Progress Notes (Signed)
Subjective:     Patient ID: [name], female   DOB: [birth year], 71 y.o.   MRN: [id number]  HPI Patient seen for follow-up regarding [facility]. She has a long history of smoking and quit about 7 years ago after bout 75 pack year history. She developed severe cough and [facility] and was diagnosed with presumptive community-acquired pneumonia though chest x-ray was not done. She was placed on Levaquin once daily for 5 days and has finished that course now. She still has severe cough at night. No hemoptysis. She's having night sweats but no documented fever since her initial fever. No pleuritic pain. She's had some intermittent hoarseness. No obvious wheezing.  Nighttime cough not relieved with over-the-counter medication. She's been extremely fatigued which she relates to poor sleep quality. No allergies other than aspirin.  Past Medical History:  Diagnosis Date  . Anxiety    aniety attacks  . Bronchitis   . Colon polyps   . Depression   . Hay fever    allergies  . History of kidney stones   . Migraines    not in 40 years   Past Surgical History:  Procedure Laterality Date  . BREAST BIOPSY  1990  . caesarean secion  1974, 1977, 1984  . CESAREAN SECTION  [phone]  . COLONOSCOPY W/ POLYPECTOMY     3   . OPEN REDUCTION INTERNAL FIXATION (ORIF) DISTAL RADIAL FRACTURE Right 11/03/2014   Procedure: OPEN REDUCTION INTERNAL FIXATION (ORIF) RIGHT DISTAL RADIAL FRACTURE WITH REPAIR AND RECONSTRUCTION;  Surgeon: Roseanne Kaufman, MD;  Location: [facility];  Service: Orthopedics;  Laterality: Right;    reports that she quit smoking about 6 years ago. Her smoking use included Cigarettes. She has a 75.00 pack-year smoking history. She has never used smokeless tobacco. She reports that she drinks alcohol. She reports that she does not use drugs. family history is not on file. Allergies  Allergen Reactions  . Aspirin     REACTION: vasculitis        Review of Systems  Constitutional: Positive for fatigue. Negative for appetite change, chills, fever and unexpected weight change.  HENT: Positive for congestion.   Respiratory: Positive for cough. Negative for shortness of breath and wheezing.   Cardiovascular: Negative for chest pain.       Objective:   Physical Exam  Constitutional: She appears well-developed and well-nourished.  HENT:  Right Ear: External ear normal.  Left Ear: External ear normal.  Mouth/Throat: Oropharynx is clear and moist.  Neck: Neck supple.  Cardiovascular: Normal rate and regular rhythm.   Pulmonary/Chest: Effort normal. No respiratory distress. She has no wheezes.  She has a few faint crackles in both bases which seemed to clear slightly with deep breathing  Musculoskeletal: She exhibits no edema.       Assessment:     Cough in an ex-smoker with presumptive diagnosis an outside clinic of community-acquired pneumonia    Plan:     -Obtain chest x-ray to further assess -Limited Hycodan cough syrup 1 teaspoon daily at bedtime for severe cough at night -Follow-up immediately for any fever or worsening symptoms -We have suggested she set up Medicare wellness visit -We discussed possible low-dose CT scanning of the lung for lung cancer screening once acute illness over  Eulas Post MD [facility] at [facility]

## 2016-09-26 NOTE — Progress Notes (Signed)
Pre visit review using our clinic review tool, if applicable. No additional management support is needed unless otherwise documented below in the visit note. 

## 2016-09-28 ENCOUNTER — Telehealth: Payer: Self-pay

## 2016-09-28 NOTE — Telephone Encounter (Signed)
Patient in to see Dr. Elease Hashimoto with long smoking hx. Wanted to consider the LDCT and outreach to schedule apt.  Left VM to call my direct line or the office to schedule AWV

## 2016-10-06 NOTE — Telephone Encounter (Signed)
Call to ms. Beckles to discuss AWV. STated that she was told by the "front desk" that the first year on Medicare, she would need to schedule with Dr. Elease Hashimoto;  She confirmed at 57, this is her first year on medicare.  She will schedule for  Welcome to Medicare visit in Sept

## 2016-11-02 ENCOUNTER — Other Ambulatory Visit: Payer: Self-pay | Admitting: Family Medicine

## 2016-12-22 ENCOUNTER — Ambulatory Visit: Payer: Medicare Other | Admitting: Family Medicine

## 2017-01-02 ENCOUNTER — Ambulatory Visit (INDEPENDENT_AMBULATORY_CARE_PROVIDER_SITE_OTHER): Payer: Medicare Other | Admitting: Family Medicine

## 2017-01-02 ENCOUNTER — Encounter: Payer: Self-pay | Admitting: Family Medicine

## 2017-01-02 ENCOUNTER — Other Ambulatory Visit: Payer: Self-pay | Admitting: Acute Care

## 2017-01-02 VITALS — BP 118/68 | HR 71 | Temp 98.0°F | Ht 67.0 in | Wt 158.0 lb

## 2017-01-02 DIAGNOSIS — Z1159 Encounter for screening for other viral diseases: Secondary | ICD-10-CM

## 2017-01-02 DIAGNOSIS — Z23 Encounter for immunization: Secondary | ICD-10-CM

## 2017-01-02 DIAGNOSIS — Z0000 Encounter for general adult medical examination without abnormal findings: Secondary | ICD-10-CM | POA: Diagnosis not present

## 2017-01-02 DIAGNOSIS — H8111 Benign paroxysmal vertigo, right ear: Secondary | ICD-10-CM

## 2017-01-02 DIAGNOSIS — M79674 Pain in right toe(s): Secondary | ICD-10-CM | POA: Diagnosis not present

## 2017-01-02 DIAGNOSIS — Z87891 Personal history of nicotine dependence: Secondary | ICD-10-CM

## 2017-01-02 DIAGNOSIS — F339 Major depressive disorder, recurrent, unspecified: Secondary | ICD-10-CM | POA: Diagnosis not present

## 2017-01-02 MED ORDER — FLUOXETINE HCL 20 MG PO CAPS: ORAL_CAPSULE | ORAL | 3 refills | Status: DC

## 2017-01-02 NOTE — Progress Notes (Signed)
Subjective:     Patient ID: [name], female   DOB: [birth year], 71 y.o.   MRN: [id number]  HPI Patient is here today for Medicare subsequent annual wellness visit and medical follow-up.  Medical problems include past history of smoking, history of kidney stones, mild hyperlipidemia. She has acute issues today as follows  Intermittent vertigo since around late June. She first noted this while on air flight. Her symptoms are consistently worse when turning to the right side. Denies any nausea or vomiting. No hearing changes. No visual changes. No ataxia. No history of similar problem. No symptoms turning to the left. Symptoms seem to be increasing in frequency over the past couple weeks.  Right second toe pain. She has some bony thickeing of second toe and this is becoming more painful with walking and with certain shoes. She would like either podiatry or orthopedic consult.  No hx of injury.   Colonoscopy up-to-date. No history of Prevnar 13. Tetanus up-to-date. No history of hepatitis C screening. She's not had new shingles vaccine.  Patient has history of mild depression and has been on Prozac for several years. She is requesting refills.  Past Medical History:  Diagnosis Date  . Anxiety    aniety attacks  . Bronchitis   . Colon polyps   . Depression   . Hay fever    allergies  . History of kidney stones   . Migraines    not in 40 years   Past Surgical History:  Procedure Laterality Date  . BREAST BIOPSY  1990  . caesarean secion  1974, 1977, 1984  . CESAREAN SECTION  [phone]  . COLONOSCOPY W/ POLYPECTOMY     3   . OPEN REDUCTION INTERNAL FIXATION (ORIF) DISTAL RADIAL FRACTURE Right 11/03/2014   Procedure: OPEN REDUCTION INTERNAL FIXATION (ORIF) RIGHT DISTAL RADIAL FRACTURE WITH REPAIR AND RECONSTRUCTION;  Surgeon: Roseanne Kaufman, MD;  Location: [facility];  Service: Orthopedics;  Laterality: Right;    reports that she quit smoking about 6 years ago. Her smoking use  included Cigarettes. She has a 75.00 pack-year smoking history. She has never used smokeless tobacco. She reports that she drinks alcohol. She reports that she does not use drugs. family history includes Arthritis in her unknown relative; Colon polyps in her unknown relative; Depression in her unknown relative; Hypertension in her unknown relative; Migraines in her unknown relative; Stroke in her unknown relative. Allergies  Allergen Reactions  . Aspirin     REACTION: vasculitis   1.  Risk factors based on Past Medical , Social, and Family history reviewed and as indicated above with no changes 2.  Limitations in physical activities None.  No recent falls. 3.  Depression/mood No active depression or anxiety issues 4.  Hearing she has some subjective loss.  No problems with conversation. 5.  ADLs independent in all. 6.  Cognitive function (orientation to time and place, language, writing, speech,memory) no short or long term memory issues.  Language and judgement intact. 7.  Home Safety no issues 8.  Height, weight, and visual acuity.all stable. 9.  Counseling discussed -Epley maneuvers for benign peripheral positional vertigo 10. Recommendation of preventive services.Prevnar 13. She will consider new shingles vaccine. Low-dose CT lung cancer screening. Continue with at least every other year mammogram. 11. Labs based on risk factors-Hepatitis C antibody 12. Care Plan-as above 13. Other Providers-none 14. Written schedule of screening/prevention services given to patient.    Review of Systems  Constitutional: Negative for activity change, appetite  change, fatigue, fever and unexpected weight change.  HENT: Negative for ear pain, hearing loss, sore throat and trouble swallowing.   Eyes: Negative for visual disturbance.  Respiratory: Negative for cough and shortness of breath.   Cardiovascular: Negative for chest pain and palpitations.  Gastrointestinal: Negative for abdominal pain, blood  in stool, constipation and diarrhea.  Endocrine: Negative for polydipsia and polyuria.  Genitourinary: Negative for dysuria and hematuria.  Musculoskeletal: Negative for arthralgias, back pain and myalgias.  Skin: Negative for rash.  Neurological: Positive for dizziness. Negative for syncope and headaches.  Hematological: Negative for adenopathy.  Psychiatric/Behavioral: Negative for confusion and dysphoric mood.       Objective:   Physical Exam  Constitutional: She is oriented to person, place, and time. She appears well-developed and well-nourished.  HENT:  Head: Normocephalic and atraumatic.  Eyes: Pupils are equal, round, and reactive to light. EOM are normal.  Neck: Normal range of motion. Neck supple. No thyromegaly present.  Cardiovascular: Normal rate, regular rhythm and normal heart sounds.   No murmur heard. Pulmonary/Chest: Breath sounds normal. No respiratory distress. She has no wheezes. She has no rales.  Abdominal: Soft. Bowel sounds are normal. She exhibits no distension and no mass. There is no tenderness. There is no rebound and no guarding.  Musculoskeletal: Normal range of motion. She exhibits no edema.  Lymphadenopathy:    She has no cervical adenopathy.  Neurological: She is alert and oriented to person, place, and time. She displays normal reflexes. No cranial nerve deficit.  Cerebellar function normal. Patient does have some mild reproducible vertigo when lying supine with head turned to the right and not the left  Skin: No rash noted.  Psychiatric: She has a normal mood and affect. Her behavior is normal. Judgment and thought content normal.       Assessment:     #1 Medicare subsequent and a wellness visit-patient needs Prevnar 13. She will consider shingles vaccine  #2 vertigo. Suspect benign peripheral positional vertigo to the right  #3 right second toe pain.  #4 history of mild recurrent depression    Plan:     -Refill Prozac for one  year -Prevnar 13 given -Consider new shingles vaccine and consider getting at pharmacy if interested -Check hepatitis C antibody -Set up orthopedic referral -Discussed pros and cons of low-dose lung cancer CT scanning patient wishes to proceed. She meets criteria in terms of age, quit smoking about 7 years ago, and over 30 pack year history. -Discussed weight loss strategies. Consider calorie tracking application. Hopefully she can increase her walking asvsoon as vertigo improves -Touch base in 2 weeks if vertigo not resolving with home Epley maneuvers  Eulas Post MD [facility] at [facility]

## 2017-01-02 NOTE — Patient Instructions (Signed)
Consider new shingles vaccine (Shingrix) and if interested can get at local pharmacy. Consider calorie tracking App such as Myfitness[employer]  We will set up referrals to orthopedics and CT lung cancer screen.

## 2017-01-03 LAB — HEPATITIS C ANTIBODY: HCV AB: NEGATIVE

## 2017-01-05 ENCOUNTER — Ambulatory Visit (INDEPENDENT_AMBULATORY_CARE_PROVIDER_SITE_OTHER): Payer: Medicare Other | Admitting: Acute Care

## 2017-01-05 ENCOUNTER — Encounter: Payer: Self-pay | Admitting: Acute Care

## 2017-01-05 ENCOUNTER — Ambulatory Visit (INDEPENDENT_AMBULATORY_CARE_PROVIDER_SITE_OTHER)
Admission: RE | Admit: 2017-01-05 | Discharge: 2017-01-05 | Disposition: A | Discharge status: Home / Self Care | Payer: Medicare Other | Source: Ambulatory Visit | Attending: Acute Care | Admitting: Acute Care

## 2017-01-05 DIAGNOSIS — Z87891 Personal history of nicotine dependence: Secondary | ICD-10-CM | POA: Diagnosis not present

## 2017-01-05 NOTE — Progress Notes (Signed)
Shared Decision Making Visit Lung Cancer Screening Program [phone])   Eligibility:  Age 71 y.o.  Pack Years Smoking History Calculation 75 pack year smoking history (# packs/per year x # years smoked)  Recent History of coughing up blood  no  Unexplained weight loss? no ( >Than 15 pounds within the last 6 months )  Prior History Lung / other cancer no (Diagnosis within the last 5 years already requiring surveillance chest CT Scans).  Smoking Status Former Smoker  Former Smokers: Years since quit: 6 years  Quit Date: 2012  Visit Components:  Discussion included one or more decision making aids. yes  Discussion included risk/benefits of screening. yes  Discussion included potential follow up diagnostic testing for abnormal scans. yes  Discussion included meaning and risk of over diagnosis. yes  Discussion included meaning and risk of False Positives. yes  Discussion included meaning of total radiation exposure. yes  Counseling Included:  Importance of adherence to annual lung cancer LDCT screening. yes  Impact of comorbidities on ability to participate in the program. yes  Ability and willingness to under diagnostic treatment. yes  Smoking Cessation Counseling:  Current Smokers:   Discussed importance of smoking cessation. NA: former smoker  Information about tobacco cessation classes and interventions provided to patient. yes  Patient provided with "ticket" for LDCT Scan. yes  Symptomatic Patient. no  Counseling NA  Diagnosis Code: Tobacco Use Z72.0  Asymptomatic Patient yes  Counseling (Intermediate counseling: > three minutes counseling) B5102  Former Smokers:   Discussed the importance of maintaining cigarette abstinence. yes  Diagnosis Code: Personal History of Nicotine Dependence. H85.277  Information about tobacco cessation classes and interventions provided to patient. Yes  Patient provided with "ticket" for LDCT Scan. yes  Written Order  for Lung Cancer Screening with LDCT placed in Epic. Yes (CT Chest Lung Cancer Screening Low Dose W/O CM) [id number] Z12.2-Screening of respiratory organs Z87.891-Personal history of nicotine dependence  I spent 25 minutes of face to face time with [name] discussing the risks and benefits of lung cancer screening. We viewed a power point together that explained in detail the above noted topics. We took the time to pause the power point at intervals to allow for questions to be asked and answered to ensure understanding. We discussed that she had taken the single most powerful action possible to decrease her risk of developing lung cancer when she quit smoking. I counseled her to remain smoke free, and to contact me if she ever had the desire to smoke again so that I can provide resources and tools to help support the effort to remain smoke free. We discussed the time and location of the scan, and that either  Doroteo Glassman RN or I will call with the results within  24-48 hours of receiving them. She has my card and contact information in the event she needs to speak with me, in addition to a copy of the power point we reviewed as a resource. She  verbalized understanding of all of the above and had no further questions upon leaving the office.     I explained to the patient that there has been a high incidence of coronary artery disease noted on these exams. I explained that this is a non-gated exam therefore degree or severity cannot be determined. This patient is not on statin therapy. I have asked the patient to follow-up with their PCP regarding any incidental finding of coronary artery disease and management with diet or medication as  they feel is clinically indicated. The patient verbalized understanding of the above and had no further questions.  I have spent 3 minutes counseling patient on continued smoking cessation this visit.   Magdalen Spatz, NP 01/05/2017

## 2017-01-11 ENCOUNTER — Other Ambulatory Visit: Payer: Self-pay | Admitting: Acute Care

## 2017-01-11 DIAGNOSIS — Z87891 Personal history of nicotine dependence: Secondary | ICD-10-CM

## 2017-02-08 DIAGNOSIS — M79674 Pain in right toe(s): Secondary | ICD-10-CM | POA: Diagnosis not present

## 2017-02-08 DIAGNOSIS — G8929 Other chronic pain: Secondary | ICD-10-CM | POA: Diagnosis not present

## 2017-02-27 DIAGNOSIS — H2513 Age-related nuclear cataract, bilateral: Secondary | ICD-10-CM | POA: Diagnosis not present

## 2017-03-11 DIAGNOSIS — Z23 Encounter for immunization: Secondary | ICD-10-CM | POA: Diagnosis not present

## 2017-04-05 ENCOUNTER — Other Ambulatory Visit: Payer: Self-pay | Admitting: Family Medicine

## 2017-04-05 DIAGNOSIS — Z1231 Encounter for screening mammogram for malignant neoplasm of breast: Secondary | ICD-10-CM

## 2017-05-14 ENCOUNTER — Telehealth: Payer: Self-pay | Admitting: *Deleted

## 2017-05-14 ENCOUNTER — Ambulatory Visit (INDEPENDENT_AMBULATORY_CARE_PROVIDER_SITE_OTHER): Payer: Medicare Other | Admitting: Family Medicine

## 2017-05-14 ENCOUNTER — Encounter: Payer: Self-pay | Admitting: Family Medicine

## 2017-05-14 VITALS — BP 112/72 | HR 105 | Temp 99.7°F

## 2017-05-14 DIAGNOSIS — R05 Cough: Secondary | ICD-10-CM | POA: Diagnosis not present

## 2017-05-14 DIAGNOSIS — R059 Cough, unspecified: Secondary | ICD-10-CM

## 2017-05-14 MED ORDER — LEVOFLOXACIN 500 MG PO TABS: 500.0000 mg | ORAL_TABLET | Freq: Every day | ORAL | 0 refills | Status: DC

## 2017-05-14 NOTE — Patient Instructions (Signed)
Follow up promptly for any increased shortness of breath or increased fever.

## 2017-05-14 NOTE — Telephone Encounter (Signed)
Patient walked in c/o hoarseness, cough, post-nasal drip, chills, and decreased appetite since Thanksgiving.   BP: 112/72 HR: 105 RR: 18 T: 99.7 oral  O2: 95  Appt scheduled today w/ Dr. Elease Hashimoto.

## 2017-05-14 NOTE — Progress Notes (Signed)
Subjective:     Patient ID: [name], female   DOB: 04/22/1946, 71 y.o.   MRN: [id number]  HPI Patient former smoker seen with onset last week of laryngitis and increased cough. She's had some nasal congestion. Question low-grade fever over the weekend. Cough productive of yellow-green sputum. Decreased appetite. Has been taking Mucinex DM without much relief. She had pneumonia last year  Past Medical History:  Diagnosis Date  . Anxiety    aniety attacks  . Bronchitis   . Colon polyps   . Depression   . Hay fever    allergies  . History of kidney stones   . Migraines    not in 40 years   Past Surgical History:  Procedure Laterality Date  . BREAST BIOPSY  1990  . caesarean secion  1974, 1977, 1984  . CESAREAN SECTION  [phone]  . COLONOSCOPY W/ POLYPECTOMY     3   . OPEN REDUCTION INTERNAL FIXATION (ORIF) DISTAL RADIAL FRACTURE Right 11/03/2014   Procedure: OPEN REDUCTION INTERNAL FIXATION (ORIF) RIGHT DISTAL RADIAL FRACTURE WITH REPAIR AND RECONSTRUCTION;  Surgeon: Roseanne Kaufman, MD;  Location: [place];  Service: Orthopedics;  Laterality: Right;    reports that she quit smoking about 6 years ago. Her smoking use included cigarettes. She has a 75.00 pack-year smoking history. she has never used smokeless tobacco. She reports that she drinks alcohol. She reports that she does not use drugs. family history includes Arthritis in her unknown relative; Colon polyps in her unknown relative; Depression in her unknown relative; Hypertension in her unknown relative; Migraines in her unknown relative; Stroke in her unknown relative. Allergies  Allergen Reactions  . Aspirin     REACTION: vasculitis     Review of Systems  Constitutional: Positive for fever.  Respiratory: Positive for cough.        Objective:   Physical Exam  Constitutional: She appears well-developed and well-nourished.  HENT:  Right Ear: External ear normal.  Left Ear: External ear normal.   Mouth/Throat: Oropharynx is clear and moist.  Neck: Neck supple.  Cardiovascular: Normal rate and regular rhythm.  Pulmonary/Chest: Effort normal. No respiratory distress. She has no wheezes.  Musculoskeletal: She exhibits no edema.       Assessment:     Cough in a patient who smoked for several years. No respiratory distress Pneumonia several months ago.    Plan:     -Levaquin 500 milligram once daily for 7 days -stay well hydrated -consider over-the-counter Mucinex  Eulas Post MD [place] [facility]

## 2017-05-15 ENCOUNTER — Ambulatory Visit: Payer: Medicare Other | Admitting: Family Medicine

## 2017-05-21 ENCOUNTER — Ambulatory Visit
Admission: RE | Admit: 2017-05-21 | Discharge: 2017-05-21 | Disposition: A | Discharge status: Home / Self Care | Payer: Medicare Other | Source: Ambulatory Visit | Attending: Family Medicine | Admitting: Family Medicine

## 2017-05-21 DIAGNOSIS — Z1231 Encounter for screening mammogram for malignant neoplasm of breast: Secondary | ICD-10-CM

## 2017-05-30 DIAGNOSIS — J180 Bronchopneumonia, unspecified organism: Secondary | ICD-10-CM | POA: Diagnosis not present

## 2017-05-30 DIAGNOSIS — Z86010 Personal history of colonic polyps: Secondary | ICD-10-CM | POA: Diagnosis not present

## 2017-05-30 DIAGNOSIS — K625 Hemorrhage of anus and rectum: Secondary | ICD-10-CM | POA: Diagnosis not present

## 2017-05-30 DIAGNOSIS — Z800 Family history of malignant neoplasm of digestive organs: Secondary | ICD-10-CM | POA: Diagnosis not present

## 2017-07-05 DIAGNOSIS — K5730 Diverticulosis of large intestine without perforation or abscess without bleeding: Secondary | ICD-10-CM | POA: Diagnosis not present

## 2017-07-05 DIAGNOSIS — Z86010 Personal history of colonic polyps: Secondary | ICD-10-CM | POA: Diagnosis not present

## 2017-07-05 LAB — HM COLONOSCOPY

## 2017-07-13 ENCOUNTER — Encounter: Payer: Self-pay | Admitting: Family Medicine

## 2017-11-06 ENCOUNTER — Other Ambulatory Visit: Payer: Self-pay | Admitting: Family Medicine

## 2018-01-14 ENCOUNTER — Inpatient Hospital Stay: Admission: RE | Admit: 2018-01-14 | Payer: Medicare Other | Source: Ambulatory Visit

## 2018-01-14 ENCOUNTER — Ambulatory Visit (INDEPENDENT_AMBULATORY_CARE_PROVIDER_SITE_OTHER)
Admission: RE | Admit: 2018-01-14 | Discharge: 2018-01-14 | Disposition: A | Discharge status: Home / Self Care | Payer: Medicare Other | Source: Ambulatory Visit | Attending: Acute Care | Admitting: Acute Care

## 2018-01-14 DIAGNOSIS — Z87891 Personal history of nicotine dependence: Secondary | ICD-10-CM | POA: Diagnosis not present

## 2018-01-17 ENCOUNTER — Other Ambulatory Visit: Payer: Self-pay

## 2018-01-18 ENCOUNTER — Other Ambulatory Visit: Payer: Self-pay | Admitting: Acute Care

## 2018-01-18 DIAGNOSIS — Z122 Encounter for screening for malignant neoplasm of respiratory organs: Secondary | ICD-10-CM

## 2018-01-18 DIAGNOSIS — Z87891 Personal history of nicotine dependence: Secondary | ICD-10-CM

## 2018-01-22 ENCOUNTER — Ambulatory Visit (INDEPENDENT_AMBULATORY_CARE_PROVIDER_SITE_OTHER): Payer: Medicare Other | Admitting: Family Medicine

## 2018-01-22 VITALS — BP 108/66 | HR 73 | Temp 97.8°F | Wt 162.1 lb

## 2018-01-22 DIAGNOSIS — B3749 Other urogenital candidiasis: Secondary | ICD-10-CM | POA: Diagnosis not present

## 2018-01-22 DIAGNOSIS — N952 Postmenopausal atrophic vaginitis: Secondary | ICD-10-CM

## 2018-01-22 MED ORDER — ESTRADIOL 0.1 MG/GM VA CREA: 1.0000 | TOPICAL_CREAM | VAGINAL | 12 refills | Status: DC

## 2018-01-22 MED ORDER — NYSTATIN 100000 UNIT/GM EX CREA: 1.0000 "application " | TOPICAL_CREAM | Freq: Two times a day (BID) | CUTANEOUS | 0 refills | Status: DC

## 2018-01-22 NOTE — Progress Notes (Signed)
  Subjective:     Patient ID: [name], female   DOB: 10-19-1945, 72 y.o.   MRN: [id number]  HPI Patient seen with some itching and burning in the perineum region. She's had some occasional burning with urination. This has been somewhat of a chronic issue. She's had significant amount of scratching recently in the vulvar region. She's been postmenopausal for several years. Denies any urine incontinence. No vaginal discharge. No vaginal spotting. No fever or chills.  Past Medical History:  Diagnosis Date  . Anxiety    aniety attacks  . Bronchitis   . Colon polyps   . Depression   . Hay fever    allergies  . History of kidney stones   . Migraines    not in 40 years   Past Surgical History:  Procedure Laterality Date  . BREAST BIOPSY  1990  . caesarean secion  1974, 1977, 1984  . CESAREAN SECTION  [phone]  . COLONOSCOPY W/ POLYPECTOMY     3   . OPEN REDUCTION INTERNAL FIXATION (ORIF) DISTAL RADIAL FRACTURE Right 11/03/2014   Procedure: OPEN REDUCTION INTERNAL FIXATION (ORIF) RIGHT DISTAL RADIAL FRACTURE WITH REPAIR AND RECONSTRUCTION;  Surgeon: Roseanne Kaufman, MD;  Location: [place];  Service: Orthopedics;  Laterality: Right;    reports that she quit smoking about 7 years ago. Her smoking use included cigarettes. She has a 75.00 pack-year smoking history. She has never used smokeless tobacco. She reports that she drinks alcohol. She reports that she does not use drugs. family history includes Arthritis in her unknown relative; Colon polyps in her unknown relative; Depression in her unknown relative; Hypertension in her unknown relative; Migraines in her unknown relative; Stroke in her unknown relative. Allergies  Allergen Reactions  . Aspirin     REACTION: vasculitis     Review of Systems  Constitutional: Negative for chills and fever.  Gastrointestinal: Negative for abdominal pain, nausea and vomiting.  Genitourinary: Positive for dysuria and urgency. Negative for  difficulty urinating, hematuria, pelvic pain, vaginal bleeding and vaginal discharge.       Objective:   Physical Exam  Constitutional: She appears well-developed and well-nourished.  Cardiovascular: Normal rate and regular rhythm.  Pulmonary/Chest: Effort normal and breath sounds normal.  Skin: Rash noted.  She has very atrophic skin involving the vagina region and perineum. She has excoriation left labia majora. No secondary cellulitis changes. Minimal scaling. No vaginal discharge       Assessment:     Patient presents with irritation with predominantly itching and rash perineum region. Urinalysis (dipstick) shows no evidence for infection. Suspect atrophic vaginitis and cannot rule out Candida    Plan:     -Recommend nystatin cream twice daily for the next couple weeks -Discussed trial of topical estrogen. She does not have any contraindications.. Prescribed Estrace and consider nightly use for the first 1-2 weeks and then 3 times per week -Avoid scratching as much as possible -Reassess and 3 weeks and sooner as needed  Eulas Post MD [place] [facility] at [facility]

## 2018-01-22 NOTE — Patient Instructions (Signed)
Atrophic Vaginitis Atrophic vaginitis is a condition in which the tissues that line the vagina become dry and thin. This condition is most common in women who have stopped having regular menstrual periods (menopause). This usually starts when a woman is 45-73 years old. Estrogen helps to keep the vagina moist. It stimulates the vagina to produce a clear fluid that lubricates the vagina for sexual intercourse. This fluid also protects the vagina from infection. Lack of estrogen can cause the lining of the vagina to get thinner and dryer. The vagina may also shrink in size. It may become less elastic. Atrophic vaginitis tends to get worse over time as a woman's estrogen level drops. What are the causes? This condition is caused by the normal drop in estrogen that happens around the time of menopause. What increases the risk? Certain conditions or situations may lower a woman's estrogen level, which increases her risk of atrophic vaginitis. These include:  Taking medicine that blocks estrogen.  Having ovaries removed surgically.  Being treated for cancer with X-ray treatment (radiation) or medicines (chemotherapy).  Exercising very hard and often.  Having an eating disorder (anorexia).  Giving birth or breastfeeding.  Being over the age of 50.  Smoking.  What are the signs or symptoms? Symptoms of this condition include:  Pain, soreness, or bleeding during sexual intercourse (dyspareunia).  Vaginal burning, irritation, or itching.  Pain or bleeding during a vaginal examination using a speculum (pelvic exam).  Loss of interest in sexual activity.  Having burning pain when passing urine.  Vaginal discharge that is brown or yellow.  In some cases, there are no symptoms. How is this diagnosed? This condition is diagnosed with a medical history and physical exam. This will include a pelvic exam that checks whether the inside of your vagina appears pale, thin, or dry. Rarely, you may  also have other tests, including:  A urine test.  A test that checks the acid balance in your vaginal fluid (acid balance test).  How is this treated? Treatment for this condition may depend on the severity of your symptoms. Treatment may include:  Using an over-the-counter vaginal lubricant before you have sexual intercourse.  Using a long-acting vaginal moisturizer.  Using low-dose vaginal estrogen for moderate to severe symptoms that do not respond to other treatments. Options include creams, tablets, and inserts (vaginal rings). Before using vaginal estrogen, tell your health care provider if you have a history of: ? Breast cancer. ? Endometrial cancer. ? Blood clots.  Taking medicines. You may be able to take a daily pill for dyspareunia. Discuss all of the risks of this medicine with your health care provider. It is usually not recommended for women who have a family history or personal history of breast cancer.  If your symptoms are very mild and you are not sexually active, you may not need treatment. Follow these instructions at home:  Take medicines only as directed by your health care provider. Do not use herbal or alternative medicines unless your health care provider says that you can.  Use over-the-counter creams, lubricants, or moisturizers for dryness only as directed by your health care provider.  If your atrophic vaginitis is caused by menopause, discuss all of your menopausal symptoms and treatment options with your health care provider.  Do not douche.  Do not use products that can make your vagina dry. These include: ? Scented feminine sprays. ? Scented tampons. ? Scented soaps.  If it hurts to have sex, talk with your sexual   partner. Contact a health care provider if:  Your discharge looks different than normal.  Your vagina has an unusual smell.  You have new symptoms.  Your symptoms do not improve with treatment.  Your symptoms get worse. This  information is not intended to replace advice given to you by your health care provider. Make sure you discuss any questions you have with your health care provider. Document Released: 10/20/2014 Document Revised: 11/11/2015 Document Reviewed: 05/27/2014 Elsevier Interactive Patient Education  2018 Elsevier Inc.  

## 2018-02-03 ENCOUNTER — Other Ambulatory Visit: Payer: Self-pay | Admitting: Family Medicine

## 2018-02-04 DIAGNOSIS — Z23 Encounter for immunization: Secondary | ICD-10-CM | POA: Diagnosis not present

## 2018-02-12 ENCOUNTER — Encounter: Payer: Self-pay | Admitting: Family Medicine

## 2018-02-12 ENCOUNTER — Ambulatory Visit (INDEPENDENT_AMBULATORY_CARE_PROVIDER_SITE_OTHER): Payer: Medicare Other | Admitting: Family Medicine

## 2018-02-12 VITALS — BP 102/70 | HR 64 | Temp 97.9°F | Wt 161.5 lb

## 2018-02-12 DIAGNOSIS — N952 Postmenopausal atrophic vaginitis: Secondary | ICD-10-CM

## 2018-02-12 DIAGNOSIS — H2513 Age-related nuclear cataract, bilateral: Secondary | ICD-10-CM | POA: Diagnosis not present

## 2018-02-12 MED ORDER — NYSTATIN 100000 UNIT/GM EX CREA: 1.0000 "application " | TOPICAL_CREAM | Freq: Two times a day (BID) | CUTANEOUS | 2 refills | Status: DC

## 2018-02-12 NOTE — Progress Notes (Signed)
  Subjective:     Patient ID: [name], female   DOB: Sep 07, 1945, 72 y.o.   MRN: [id number]  HPI Patient seen for follow-up regarding atrophic vaginitis and possibly some Candida infection as well. We started nystatin cream and topical estrogen. She states she has about "95% better ". Itching and local irritation is improved greatly. Denies any discharge. She is using the Estrace cream now 3 times weekly. She is asking for refills of nystatin for future use as needed. She is walking regularly and perspires with that-and she thinks that may be contributing to her vaginitis symptoms somewhat. No urinary symptoms  Past Medical History:  Diagnosis Date  . Anxiety    aniety attacks  . Bronchitis   . Colon polyps   . Depression   . Hay fever    allergies  . History of kidney stones   . Migraines    not in 40 years   Past Surgical History:  Procedure Laterality Date  . BREAST BIOPSY  1990  . caesarean secion  1974, 1977, 1984  . CESAREAN SECTION  [phone]  . COLONOSCOPY W/ POLYPECTOMY     3   . OPEN REDUCTION INTERNAL FIXATION (ORIF) DISTAL RADIAL FRACTURE Right 11/03/2014   Procedure: OPEN REDUCTION INTERNAL FIXATION (ORIF) RIGHT DISTAL RADIAL FRACTURE WITH REPAIR AND RECONSTRUCTION;  Surgeon: Roseanne Kaufman, MD;  Location: [place];  Service: Orthopedics;  Laterality: Right;    reports that she quit smoking about 7 years ago. Her smoking use included cigarettes. She has a 75.00 pack-year smoking history. She has never used smokeless tobacco. She reports that she drinks alcohol. She reports that she does not use drugs. family history includes Arthritis in her unknown relative; Colon polyps in her unknown relative; Depression in her unknown relative; Hypertension in her unknown relative; Migraines in her unknown relative; Stroke in her unknown relative. Allergies  Allergen Reactions  . Aspirin     REACTION: vasculitis     Review of Systems  Constitutional: Negative for chills  and fever.  Genitourinary: Negative for dysuria and vaginal discharge.       Objective:   Physical Exam  Constitutional: She appears well-developed and well-nourished.  Cardiovascular: Normal rate and regular rhythm.  Pulmonary/Chest: Effort normal and breath sounds normal.       Assessment:     Recent vaginitis symptoms. Suspect largely atrophic vaginitis with possible component of Candida- overall greatly improved    Plan:     -continue with Estrace cream 3 times weekly -Refilled nystatin cream to use twice daily as needed for any recurrent itching  Eulas Post MD [place] [facility] at [facility]

## 2018-02-12 NOTE — Patient Instructions (Signed)
Atrophic Vaginitis Atrophic vaginitis is a condition in which the tissues that line the vagina become dry and thin. This condition is most common in women who have stopped having regular menstrual periods (menopause). This usually starts when a woman is 45-73 years old. Estrogen helps to keep the vagina moist. It stimulates the vagina to produce a clear fluid that lubricates the vagina for sexual intercourse. This fluid also protects the vagina from infection. Lack of estrogen can cause the lining of the vagina to get thinner and dryer. The vagina may also shrink in size. It may become less elastic. Atrophic vaginitis tends to get worse over time as a woman's estrogen level drops. What are the causes? This condition is caused by the normal drop in estrogen that happens around the time of menopause. What increases the risk? Certain conditions or situations may lower a woman's estrogen level, which increases her risk of atrophic vaginitis. These include:  Taking medicine that blocks estrogen.  Having ovaries removed surgically.  Being treated for cancer with X-ray treatment (radiation) or medicines (chemotherapy).  Exercising very hard and often.  Having an eating disorder (anorexia).  Giving birth or breastfeeding.  Being over the age of 50.  Smoking.  What are the signs or symptoms? Symptoms of this condition include:  Pain, soreness, or bleeding during sexual intercourse (dyspareunia).  Vaginal burning, irritation, or itching.  Pain or bleeding during a vaginal examination using a speculum (pelvic exam).  Loss of interest in sexual activity.  Having burning pain when passing urine.  Vaginal discharge that is brown or yellow.  In some cases, there are no symptoms. How is this diagnosed? This condition is diagnosed with a medical history and physical exam. This will include a pelvic exam that checks whether the inside of your vagina appears pale, thin, or dry. Rarely, you may  also have other tests, including:  A urine test.  A test that checks the acid balance in your vaginal fluid (acid balance test).  How is this treated? Treatment for this condition may depend on the severity of your symptoms. Treatment may include:  Using an over-the-counter vaginal lubricant before you have sexual intercourse.  Using a long-acting vaginal moisturizer.  Using low-dose vaginal estrogen for moderate to severe symptoms that do not respond to other treatments. Options include creams, tablets, and inserts (vaginal rings). Before using vaginal estrogen, tell your health care provider if you have a history of: ? Breast cancer. ? Endometrial cancer. ? Blood clots.  Taking medicines. You may be able to take a daily pill for dyspareunia. Discuss all of the risks of this medicine with your health care provider. It is usually not recommended for women who have a family history or personal history of breast cancer.  If your symptoms are very mild and you are not sexually active, you may not need treatment. Follow these instructions at home:  Take medicines only as directed by your health care provider. Do not use herbal or alternative medicines unless your health care provider says that you can.  Use over-the-counter creams, lubricants, or moisturizers for dryness only as directed by your health care provider.  If your atrophic vaginitis is caused by menopause, discuss all of your menopausal symptoms and treatment options with your health care provider.  Do not douche.  Do not use products that can make your vagina dry. These include: ? Scented feminine sprays. ? Scented tampons. ? Scented soaps.  If it hurts to have sex, talk with your sexual   partner. Contact a health care provider if:  Your discharge looks different than normal.  Your vagina has an unusual smell.  You have new symptoms.  Your symptoms do not improve with treatment.  Your symptoms get worse. This  information is not intended to replace advice given to you by your health care provider. Make sure you discuss any questions you have with your health care provider. Document Released: 10/20/2014 Document Revised: 11/11/2015 Document Reviewed: 05/27/2014 Elsevier Interactive Patient Education  2018 Elsevier Inc.  

## 2018-04-03 DIAGNOSIS — R05 Cough: Secondary | ICD-10-CM | POA: Diagnosis not present

## 2018-04-03 DIAGNOSIS — Z6825 Body mass index (BMI) 25.0-25.9, adult: Secondary | ICD-10-CM | POA: Diagnosis not present

## 2018-04-03 DIAGNOSIS — J441 Chronic obstructive pulmonary disease with (acute) exacerbation: Secondary | ICD-10-CM | POA: Diagnosis not present

## 2018-04-15 ENCOUNTER — Other Ambulatory Visit: Payer: Self-pay | Admitting: Family Medicine

## 2018-04-15 DIAGNOSIS — Z1231 Encounter for screening mammogram for malignant neoplasm of breast: Secondary | ICD-10-CM

## 2018-05-02 ENCOUNTER — Other Ambulatory Visit: Payer: Self-pay | Admitting: Family Medicine

## 2018-05-27 ENCOUNTER — Ambulatory Visit
Admission: RE | Admit: 2018-05-27 | Discharge: 2018-05-27 | Disposition: A | Discharge status: Home / Self Care | Payer: Medicare Other | Source: Ambulatory Visit | Attending: Family Medicine | Admitting: Family Medicine

## 2018-05-27 DIAGNOSIS — Z1231 Encounter for screening mammogram for malignant neoplasm of breast: Secondary | ICD-10-CM

## 2018-07-29 ENCOUNTER — Other Ambulatory Visit: Payer: Self-pay | Admitting: Family Medicine

## 2018-10-02 ENCOUNTER — Telehealth: Payer: Self-pay | Admitting: *Deleted

## 2018-10-02 NOTE — Telephone Encounter (Signed)
Called pt to schedule awv pt declined

## 2018-11-03 ENCOUNTER — Other Ambulatory Visit: Payer: Self-pay | Admitting: Family Medicine

## 2018-11-04 ENCOUNTER — Telehealth: Payer: Self-pay

## 2018-11-04 NOTE — Telephone Encounter (Signed)
Called patient to let her know that she needs a visit for refills of her Prozac, patient was on way to [place] and she stated that she will call to schedule a telephone visit when she is back in town. Patient stated that she does not need a refill at this time. Patient verbalized an understanding.

## 2018-11-19 ENCOUNTER — Telehealth: Payer: Self-pay | Admitting: *Deleted

## 2018-11-19 NOTE — Telephone Encounter (Signed)
Copied from [place] [id number]. Topic: General - Other >> Nov 19, 2018  3:15 PM Carolyn Stare wrote:  [name] 3 times phone hung each time, pt need an appt for med refill per  Clinic RN called patient. Patient scheduled for in office visit for 11/25/2018 at 2:30 PM. No further action is needed. Please call patient on Friday and provider her with phone number to call when she arrives .

## 2018-11-25 ENCOUNTER — Ambulatory Visit (INDEPENDENT_AMBULATORY_CARE_PROVIDER_SITE_OTHER): Payer: Medicare Other | Admitting: Family Medicine

## 2018-11-25 ENCOUNTER — Encounter: Payer: Self-pay | Admitting: Family Medicine

## 2018-11-25 ENCOUNTER — Other Ambulatory Visit: Payer: Self-pay

## 2018-11-25 VITALS — BP 122/60 | HR 86 | Temp 98.7°F | Wt 162.7 lb

## 2018-11-25 DIAGNOSIS — F339 Major depressive disorder, recurrent, unspecified: Secondary | ICD-10-CM | POA: Diagnosis not present

## 2018-11-25 DIAGNOSIS — N952 Postmenopausal atrophic vaginitis: Secondary | ICD-10-CM | POA: Insufficient documentation

## 2018-11-25 MED ORDER — ESTRADIOL 0.1 MG/GM VA CREA: 1.0000 | TOPICAL_CREAM | VAGINAL | 12 refills | Status: DC

## 2018-11-25 MED ORDER — FLUOXETINE HCL 20 MG PO CAPS: ORAL_CAPSULE | ORAL | 3 refills | Status: DC

## 2018-11-25 NOTE — Progress Notes (Signed)
Subjective:     Patient ID: [name], female   DOB: 23-Jan-1946, 73 y.o.   MRN: [id number]  HPI Patient here for medication refills.  She is on Prozac for history of recurrent depression.  She feels her mood is very stable currently.  She is staying fairly isolated during pandemic.  She and her husband travel to [place] periodically to stay at their beach home there and alternate their time both places..  She has been on Prozac for several years and is reluctant to discontinue.  She has history of atrophic vaginitis and takes Estrace cream.  Requesting refills.  Denies any recent cough, fever, dyspnea, chest pain.  No recent falls  Past Medical History:  Diagnosis Date  . Anxiety    aniety attacks  . Bronchitis   . Colon polyps   . Depression   . Hay fever    allergies  . History of kidney stones   . Migraines    not in 40 years   Past Surgical History:  Procedure Laterality Date  . BREAST BIOPSY  1990  . caesarean secion  1974, 1977, 1984  . CESAREAN SECTION  [phone]  . COLONOSCOPY W/ POLYPECTOMY     3   . OPEN REDUCTION INTERNAL FIXATION (ORIF) DISTAL RADIAL FRACTURE Right 11/03/2014   Procedure: OPEN REDUCTION INTERNAL FIXATION (ORIF) RIGHT DISTAL RADIAL FRACTURE WITH REPAIR AND RECONSTRUCTION;  Surgeon: Roseanne Kaufman, MD;  Location: [place];  Service: Orthopedics;  Laterality: Right;    reports that she quit smoking about 8 years ago. Her smoking use included cigarettes. She has a 75.00 pack-year smoking history. She has never used smokeless tobacco. She reports current alcohol use. She reports that she does not use drugs. family history includes Arthritis in her unknown relative; Colon polyps in her unknown relative; Depression in her unknown relative; Hypertension in her unknown relative; Migraines in her unknown relative; Stroke in her unknown relative. Allergies  Allergen Reactions  . Aspirin     REACTION: vasculitis     Review of Systems   Constitutional: Negative for appetite change, fatigue and unexpected weight change.  Eyes: Negative for visual disturbance.  Respiratory: Negative for cough, chest tightness, shortness of breath and wheezing.   Cardiovascular: Negative for chest pain, palpitations and leg swelling.  Endocrine: Negative for polydipsia and polyuria.  Genitourinary: Negative for dysuria.  Neurological: Negative for dizziness, seizures, syncope, weakness, light-headedness and headaches.       Objective:   Physical Exam Constitutional:      Appearance: She is well-developed.  Eyes:     Pupils: Pupils are equal, round, and reactive to light.  Neck:     Musculoskeletal: Neck supple.     Thyroid: No thyromegaly.     Vascular: No JVD.  Cardiovascular:     Rate and Rhythm: Normal rate and regular rhythm.     Heart sounds: No gallop.   Pulmonary:     Effort: Pulmonary effort is normal. No respiratory distress.     Breath sounds: Normal breath sounds. No wheezing or rales.  Musculoskeletal:     Right lower leg: No edema.     Left lower leg: No edema.  Neurological:     Mental Status: She is alert.        Assessment:     #1 history of recurrent depression stable on Prozac  #2 history of postmenopausal related atrophic vaginitis    Plan:     -Refill Prozac and Estrace vaginal cream for 1  year -Consider Medicare wellness visit later this year  Eulas Post MD [facility]

## 2019-02-11 DIAGNOSIS — Z23 Encounter for immunization: Secondary | ICD-10-CM | POA: Diagnosis not present

## 2019-02-13 ENCOUNTER — Other Ambulatory Visit: Payer: Self-pay | Admitting: Family Medicine

## 2019-02-16 NOTE — Telephone Encounter (Signed)
Refill for 6 months. 

## 2019-02-18 DIAGNOSIS — H43813 Vitreous degeneration, bilateral: Secondary | ICD-10-CM | POA: Diagnosis not present

## 2019-02-18 DIAGNOSIS — H52203 Unspecified astigmatism, bilateral: Secondary | ICD-10-CM | POA: Diagnosis not present

## 2019-02-18 DIAGNOSIS — H5213 Myopia, bilateral: Secondary | ICD-10-CM | POA: Diagnosis not present

## 2019-02-18 DIAGNOSIS — H25813 Combined forms of age-related cataract, bilateral: Secondary | ICD-10-CM | POA: Diagnosis not present

## 2019-02-25 ENCOUNTER — Telehealth: Payer: Self-pay | Admitting: Acute Care

## 2019-02-25 NOTE — Telephone Encounter (Signed)
Script Screening patients for COVID-19 and reviewing new operational procedures  Greeting - The reason I am calling is to share with you some new changes to our processes that are designed to help [place] keep everyone safe. Is now a good time to speak with you? Patient says "no' - ask them when you can call back and let them know it's important to do this prior to their appointment.  Patient says "yes" - Great, [name]) the first thing I need to do is ask you some screening Questions.  1. To the best of your knowledge, have you been in close contact with any one with a confirmed diagnosis of COVID 19? o No - proceed to next question  2. Have you had any one or more of the following: fever, chills, cough, shortness of breath or any flu-like symptoms? o No - proceed to next question  3. Have you been diagnosed with or have a previous diagnosis of COVID 19? o No - proceed to next question  4. I am going to go over a few other symptoms with you. Please let me know if you are experiencing any of the following: . Ear, nose or throat discomfort . A sore throat . Headache . Muscle pain . Diarrhea . Loss of taste or smell o No - proceed to next question  Thank you for answering these questions. Please know we will ask you these questions or similar questions when you arrive for your appointment and again it's how we are keeping everyone safe. Also, to keep you safe, please use the provided hand sanitizer when you enter the building. (Insert pt name), we are asking everyone in the building to wear a mask because they help [place] prevent the spread of germs. Do you have a mask of your own, if not, we are happy to provide one for you. The last thing I want to go over with you is the no visitor guidelines. This means no one can attend the appointment with you unless you need physical assistance. I understand this may be different from your past appointments and I know this may be difficult but please  know if someone is driving you we are happy to call them for you once your appointment is over.  [INSERT [place]  (Insert pt name) I've given you a lot of information, what questions do you have about what I've talked about today or your appointment tomorrow? Benjie Karvonen, CMA

## 2019-02-26 ENCOUNTER — Ambulatory Visit (INDEPENDENT_AMBULATORY_CARE_PROVIDER_SITE_OTHER)
Admission: RE | Admit: 2019-02-26 | Discharge: 2019-02-26 | Disposition: A | Discharge status: Home / Self Care | Payer: Medicare Other | Source: Ambulatory Visit | Attending: Acute Care | Admitting: Acute Care

## 2019-02-26 ENCOUNTER — Other Ambulatory Visit: Payer: Self-pay

## 2019-02-26 DIAGNOSIS — Z87891 Personal history of nicotine dependence: Secondary | ICD-10-CM | POA: Diagnosis not present

## 2019-02-26 DIAGNOSIS — Z122 Encounter for screening for malignant neoplasm of respiratory organs: Secondary | ICD-10-CM

## 2019-02-28 ENCOUNTER — Other Ambulatory Visit: Payer: Self-pay | Admitting: *Deleted

## 2019-02-28 DIAGNOSIS — Z122 Encounter for screening for malignant neoplasm of respiratory organs: Secondary | ICD-10-CM

## 2019-02-28 DIAGNOSIS — Z87891 Personal history of nicotine dependence: Secondary | ICD-10-CM

## 2019-04-21 ENCOUNTER — Other Ambulatory Visit: Payer: Self-pay | Admitting: Family Medicine

## 2019-04-21 DIAGNOSIS — Z1231 Encounter for screening mammogram for malignant neoplasm of breast: Secondary | ICD-10-CM

## 2019-05-12 ENCOUNTER — Other Ambulatory Visit: Payer: Self-pay

## 2019-06-04 ENCOUNTER — Other Ambulatory Visit: Payer: Self-pay

## 2019-06-04 MED ORDER — NYSTATIN 100000 UNIT/GM EX CREA: 1.0000 "application " | TOPICAL_CREAM | Freq: Two times a day (BID) | CUTANEOUS | 2 refills | Status: DC

## 2019-06-05 ENCOUNTER — Ambulatory Visit (INDEPENDENT_AMBULATORY_CARE_PROVIDER_SITE_OTHER): Payer: Medicare Other

## 2019-06-05 VITALS — Ht 67.0 in | Wt 162.0 lb

## 2019-06-05 DIAGNOSIS — Z0000 Encounter for general adult medical examination without abnormal findings: Secondary | ICD-10-CM

## 2019-06-05 DIAGNOSIS — M810 Age-related osteoporosis without current pathological fracture: Secondary | ICD-10-CM

## 2019-06-05 NOTE — Patient Instructions (Signed)
[name] , Thank you for taking time to participate in your Medicare Wellness Visit. I appreciate your ongoing commitment to your health goals. Please review the following plan we discussed and let me know if I can assist you in the future.   Screening recommendations/referrals: Colorectal Screening: completed 07/05/2017; due 07/05/2022. Mammogram: scheduled 06/10/2019; up to date Bone Density: completed 05/05/2010; due again; order sent to [facility]. They will contact you to schedule.  Vision and Dental Exams: Recommended annual ophthalmology exams for early detection of glaucoma and other disorders of the eye Recommended annual dental exams for proper oral hygiene  Diabetic Exams: Diabetic Eye Exam: N/A Diabetic Foot Exam: N/A  Vaccinations: Influenza vaccine: completed 02/11/2019; up to date. Repeat Fall '21 Pneumococcal vaccine: completed 01/02/2017; up to date.  Tdap vaccine: past due since 08/27/2007. Check with pharmacy or we can give you here at a nurse visit. Shingles vaccine: completed 03/11/2017 and 06/30/2017.  Advanced directives: Advance directives discussed with you today. Please bring a copy of your POA (Power of [place]) and/or Living Will to your next appointment.  Goals: Continue to drink at least 6-8 8oz glasses of water per day. Continue to exercise for at least 150 minutes per week. Recommend to remove any items from the home that may cause slips or trips.  Next appointment: Please schedule your Annual Wellness Visit with your Nurse Health Advisor in one year.  Preventive Care 19 Years and Older, Female Preventive care refers to lifestyle choices and visits with your health care provider that can promote health and wellness. What does preventive care include?  A yearly physical exam. This is also called an annual well check.  Dental exams once or twice a year.  Routine eye exams. Ask your health care provider how often you should have your  eyes checked.  Personal lifestyle choices, including:  Daily care of your teeth and gums.  Regular physical activity.  Eating a healthy diet.  Avoiding tobacco and drug use.  Limiting alcohol use.  Practicing safe sex.  Taking low-dose aspirin every day if recommended by your health care provider.  Taking vitamin and mineral supplements as recommended by your health care provider. What happens during an annual well check? The services and screenings done by your health care provider during your annual well check will depend on your age, overall health, lifestyle risk factors, and family history of disease. Counseling  Your health care provider may ask you questions about your:  Alcohol use.  Tobacco use.  Drug use.  Emotional well-being.  Home and relationship well-being.  Sexual activity.  Eating habits.  History of falls.  Memory and ability to understand (cognition).  Work and work Statistician.  Reproductive health. Screening  You may have the following tests or measurements:  Height, weight, and BMI.  Blood pressure.  Lipid and cholesterol levels. These may be checked every 5 years, or more frequently if you are over 59 years old.  Skin check.  Lung cancer screening. You may have this screening every year starting at age 71 if you have a 30-pack-year history of smoking and currently smoke or have quit within the past 15 years.  Fecal occult blood test (FOBT) of the stool. You may have this test every year starting at age 32.  Flexible sigmoidoscopy or colonoscopy. You may have a sigmoidoscopy every 5 years or a colonoscopy every 10 years starting at age 94.  Hepatitis C blood test.  Hepatitis B blood test.  Sexually transmitted disease (  STD) testing.  Diabetes screening. This is done by checking your blood sugar (glucose) after you have not eaten for a while (fasting). You may have this done every 1-3 years.  Bone density scan. This is done to  screen for osteoporosis. You may have this done starting at age 55.  Mammogram. This may be done every 1-2 years. Talk to your health care provider about how often you should have regular mammograms. Talk with your health care provider about your test results, treatment options, and if necessary, the need for more tests. Vaccines  Your health care provider may recommend certain vaccines, such as:  Influenza vaccine. This is recommended every year.  Tetanus, diphtheria, and acellular pertussis (Tdap, Td) vaccine. You may need a Td booster every 10 years.  Zoster vaccine. You may need this after age 5.  Pneumococcal 13-valent conjugate (PCV13) vaccine. One dose is recommended after age 43.  Pneumococcal polysaccharide (PPSV23) vaccine. One dose is recommended after age 23. Talk to your health care provider about which screenings and vaccines you need and how often you need them. This information is not intended to replace advice given to you by your health care provider. Make sure you discuss any questions you have with your health care provider. Document Released: 07/02/2015 Document Revised: 02/23/2016 Document Reviewed: 04/06/2015 Elsevier Interactive Patient Education  2017 [employer] Prevention in the Home Falls can cause injuries. They can happen to people of all ages. There are many things you can do to make your home safe and to help prevent falls. What can I do on the outside of my home?  Regularly fix the edges of walkways and driveways and fix any cracks.  Remove anything that might make you trip as you walk through a door, such as a raised step or threshold.  Trim any bushes or trees on the path to your home.  Use bright outdoor lighting.  Clear any walking paths of anything that might make someone trip, such as rocks or tools.  Regularly check to see if handrails are loose or broken. Make sure that both sides of any steps have handrails.  Any raised decks  and porches should have guardrails on the edges.  Have any leaves, snow, or ice cleared regularly.  Use sand or salt on walking paths during winter.  Clean up any spills in your garage right away. This includes oil or grease spills. What can I do in the bathroom?  Use night lights.  Install grab bars by the toilet and in the tub and shower. Do not use towel bars as grab bars.  Use non-skid mats or decals in the tub or shower.  If you need to sit down in the shower, use a plastic, non-slip stool.  Keep the floor dry. Clean up any water that spills on the floor as soon as it happens.  Remove soap buildup in the tub or shower regularly.  Attach bath mats securely with double-sided non-slip rug tape.  Do not have throw rugs and other things on the floor that can make you trip. What can I do in the bedroom?  Use night lights.  Make sure that you have a light by your bed that is easy to reach.  Do not use any sheets or blankets that are too big for your bed. They should not hang down onto the floor.  Have a firm chair that has side arms. You can use this for support while you get dressed.  Do  not have throw rugs and other things on the floor that can make you trip. What can I do in the kitchen?  Clean up any spills right away.  Avoid walking on wet floors.  Keep items that you use a lot in easy-to-reach places.  If you need to reach something above you, use a strong step stool that has a grab bar.  Keep electrical cords out of the way.  Do not use floor polish or wax that makes floors slippery. If you must use wax, use non-skid floor wax.  Do not have throw rugs and other things on the floor that can make you trip. What can I do with my stairs?  Do not leave any items on the stairs.  Make sure that there are handrails on both sides of the stairs and use them. Fix handrails that are broken or loose. Make sure that handrails are as long as the stairways.  Check any  carpeting to make sure that it is firmly attached to the stairs. Fix any carpet that is loose or worn.  Avoid having throw rugs at the top or bottom of the stairs. If you do have throw rugs, attach them to the floor with carpet tape.  Make sure that you have a light switch at the top of the stairs and the bottom of the stairs. If you do not have them, ask someone to add them for you. What else can I do to help prevent falls?  Wear shoes that:  Do not have high heels.  Have rubber bottoms.  Are comfortable and fit you well.  Are closed at the toe. Do not wear sandals.  If you use a stepladder:  Make sure that it is fully opened. Do not climb a closed stepladder.  Make sure that both sides of the stepladder are locked into place.  Ask someone to hold it for you, if possible.  Clearly mark and make sure that you can see:  Any grab bars or handrails.  First and last steps.  Where the edge of each step is.  Use tools that help you move around (mobility aids) if they are needed. These include:  Canes.  Walkers.  Scooters.  Crutches.  Turn on the lights when you go into a dark area. Replace any light bulbs as soon as they burn out.  Set up your furniture so you have a clear path. Avoid moving your furniture around.  If any of your floors are uneven, fix them.  If there are any pets around you, be aware of where they are.  Review your medicines with your doctor. Some medicines can make you feel dizzy. This can increase your chance of falling. Ask your doctor what other things that you can do to help prevent falls. This information is not intended to replace advice given to you by your health care provider. Make sure you discuss any questions you have with your health care provider. Document Released: 04/01/2009 Document Revised: 11/11/2015 Document Reviewed: 07/10/2014 Elsevier Interactive Patient Education  2017 [employer].

## 2019-06-05 NOTE — Progress Notes (Signed)
This visit is being conducted via phone call due to the COVID-19 pandemic. This patient has given me verbal consent via phone to conduct this visit, patient states they are participating from their home address. Some vital signs may be absent or patient reported.   Patient identification: identified by name, DOB, and current address.  Location provider: [facility], Office Persons participating in the virtual visit: [name] and Wanda Forts, LPN.  Subjective:   [name] is a 73 y.o. female who presents for Medicare Annual (Subsequent) preventive examination.  She is doing well at this time. She is walking 2 miles almost every day and eating a healthy, balanced diet. She is missing her grand-children but is hopeful the pandemic will settle down soon.  Review of Systems:  Cardiac Risk Factors include: advanced age (>55men, >51 women);dyslipidemia     Objective:     Vitals: Ht 5\' 7"  (1.702 m)   Wt 162 lb (73.5 kg)   BMI 25.37 kg/m   Body mass index is 25.37 kg/m.  Advanced Directives 06/05/2019  Does Patient Have a Medical Advance Directive? Yes  Type of Paramedic of [place];Living will  Does patient want to make changes to medical advance directive? No - Patient declined  Copy of [place] in Chart? No - copy requested    Tobacco Social History   Tobacco Use  Smoking Status Former Smoker  . Packs/day: 1.50  . Years: 50.00  . Pack years: 75.00  . Types: Cigarettes  . Quit date: 2012  . Years since quitting: 8.9  Smokeless Tobacco Never Used  Tobacco Comment   Former smoker     Counseling given: Not Answered Comment: Former smoker   Clinical Intake:  Pre-visit preparation completed: Yes  Pain : No/denies pain     Nutritional Status: BMI 25 -29 Overweight Nutritional Risks: None Diabetes: No  How often do you need to have someone help you when you read instructions, pamphlets, or other  written materials from your doctor or pharmacy?: 1 - Never What is the last grade level you completed in school?: 1 year college  Interpreter Needed?: No  Information entered by :: Wanda Forts, LPN.  Past Medical History:  Diagnosis Date  . Anxiety    aniety attacks  . Bronchitis   . Colon polyps   . Depression   . Hay fever    allergies  . History of kidney stones   . Migraines    not in 40 years   Past Surgical History:  Procedure Laterality Date  . BREAST BIOPSY  1990  . caesarean secion  1974, 1977, 1984  . CESAREAN SECTION  [phone]  . COLONOSCOPY W/ POLYPECTOMY     3   . OPEN REDUCTION INTERNAL FIXATION (ORIF) DISTAL RADIAL FRACTURE Right 11/03/2014   Procedure: OPEN REDUCTION INTERNAL FIXATION (ORIF) RIGHT DISTAL RADIAL FRACTURE WITH REPAIR AND RECONSTRUCTION;  Surgeon: Roseanne Kaufman, MD;  Location: [facility];  Service: Orthopedics;  Laterality: Right;   Family History  Problem Relation Age of Onset  . Arthritis Other   . Stroke Other   . Hypertension Other   . Depression Other   . Migraines Other   . Colon polyps Other   . Breast cancer Neg Hx    Social History   Socioeconomic History  . Marital status: Married    Spouse name: Not on file  . Number of children: 3  . Years of education: 70  . Highest  education level: Some college, no degree  Occupational History    Comment: Retired   Tobacco Use  . Smoking status: Former Smoker    Packs/day: 1.50    Years: 50.00    Pack years: 75.00    Types: Cigarettes    Quit date: 2012    Years since quitting: 8.9  . Smokeless tobacco: Never Used  . Tobacco comment: Former smoker  Substance and Sexual Activity  . Alcohol use: Yes    Alcohol/week: 2.0 standard drinks    Types: 2 Glasses of wine per week    Comment: 2 glasses of wine daily  . Drug use: No  . Sexual activity: Not on file  Other Topics Concern  . Not on file  Social History Narrative   Married   3 children   Social Determinants of  Health   Financial Resource Strain:   . Difficulty of Paying Living Expenses: Not on file  Food Insecurity:   . Worried About Charity fundraiser in the Last Year: Not on file  . Ran Out of Food in the Last Year: Not on file  Transportation Needs:   . Lack of Transportation (Medical): Not on file  . Lack of Transportation (Non-Medical): Not on file  Physical Activity:   . Days of Exercise per Week: Not on file  . Minutes of Exercise per Session: Not on file  Stress:   . Feeling of Stress : Not on file  Social Connections:   . Frequency of Communication with Friends and Family: Not on file  . Frequency of Social Gatherings with Friends and Family: Not on file  . Attends Religious Services: Not on file  . Active Member of Clubs or Organizations: Not on file  . Attends Archivist Meetings: Not on file  . Marital Status: Not on file    Outpatient Encounter Medications as of 06/05/2019  Medication Sig  . chlorpheniramine (CHLOR-TRIMETON) 4 MG tablet Take 4 mg by mouth 2 (two) times daily as needed for allergies.  Marland Kitchen estradiol (ESTRACE) 0.1 MG/GM vaginal cream INSERT 1 APPLICATORFUL VAGINALLY 3 TIMES A WEEK  . FLUoxetine (PROZAC) 20 MG capsule TAKE 1 CAPSULE BY MOUTH EVERY DAY  . Multiple Vitamin (MULTIVITAMIN) capsule Take 1 capsule by mouth daily.  Marland Kitchen nystatin cream (MYCOSTATIN) Apply 1 application topically 2 (two) times daily.   No facility-administered encounter medications on file as of 06/05/2019.    Activities of Daily Living In your present state of health, do you have any difficulty performing the following activities: 06/05/2019  Hearing? N  Vision? N  Difficulty concentrating or making decisions? N  Walking or climbing stairs? N  Dressing or bathing? N  Doing errands, shopping? N  Preparing Food and eating ? N  Using the Toilet? N  In the past six months, have you accidently leaked urine? N  Do you have problems with loss of bowel control? N  Managing your  Medications? N  Managing your Finances? N  Housekeeping or managing your Housekeeping? N  Some recent data might be hidden    Patient Care Team: Eulas Post, MD as PCP - General    Assessment:   This is a routine wellness examination for [name].  Exercise Activities and Dietary recommendations Exercise limited by: None identified  Goals   None     Fall Risk Fall Risk  06/05/2019 01/22/2018 01/17/2018 09/02/2014  Falls in the past year? 0 No No No  Comment - - Emmi Telephone Survey:  data to providers prior to load -   Is the patient's home free of loose throw rugs in walkways, pet beds, electrical cords, etc?   yes      Grab bars in the bathroom? yes      Handrails on the stairs?   yes      Adequate lighting?   yes  Timed Get Up and Go performed: N/A due to telephone visit  Depression Screen PHQ 2/9 Scores 06/05/2019 01/22/2018 09/02/2014  PHQ - 2 Score 1 0 0     Cognitive Function     6CIT Screen 06/05/2019  What Year? 0 points  What month? 0 points  What time? 0 points  Count back from 20 0 points  Months in reverse 0 points  Repeat phrase 0 points  Total Score 0    Immunization History  Administered Date(s) Administered  . Hepatitis A, Adult 05/03/2015  . Influenza Split 03/14/2011, 04/29/2012, 02/20/2013  . Influenza Whole 03/19/2010  . Influenza, High Dose Seasonal PF 03/26/2014, 03/11/2017, 02/04/2018, 02/11/2019  . Influenza-Unspecified 03/16/2015, 03/06/2016, 03/11/2017  . Pneumococcal Conjugate-13 01/02/2017  . Pneumococcal Polysaccharide-23 11/03/2009  . Td 06/19/2006, 08/27/2007  . Zoster Recombinat (Shingrix) 03/11/2017, 06/30/2017    Qualifies for Shingles Vaccine? Patient has already completed Shingrix   Screening Tests Health Maintenance  Topic Date Due  . TETANUS/TDAP  08/26/2017  . PNA vac Low Risk Adult (2 of 2 - PPSV23) 01/02/2018  . MAMMOGRAM  05/27/2020  . COLONOSCOPY  07/06/2027  . INFLUENZA VACCINE  Completed  . DEXA SCAN   Completed  . Hepatitis C Screening  Completed    Cancer Screenings: Lung: Low Dose CT Chest recommended if Age 47-80 years, 30 pack-year currently smoking OR have quit w/in 15years. Patient does not qualify. Breast:  Up to date on Mammogram? Yes   Up to date of Bone Density/Dexa? No; order sent to [facility]. Colorectal: Yes  Additional Screenings: : Hepatitis C Screening: Completed on 07/017/2018     Plan:   Mrs. Alonso is agreeable to a new DEXA scan and order has been sent to [facility]. She will also obtain an updated Tdap either at pharmacy or in our office.   I have personally reviewed and noted the following in the patient's chart:   . Medical and social history . Use of alcohol, tobacco or illicit drugs  . Current medications and supplements . Functional ability and status . Nutritional status . Physical activity . Advanced directives . List of other physicians . Hospitalizations, surgeries, and ER visits in previous 12 months . Vitals . Screenings to include cognitive, depression, and falls . Referrals and appointments  In addition, I have reviewed and discussed with patient certain preventive protocols, quality metrics, and best practice recommendations. A written personalized care plan for preventive services as well as general preventive health recommendations were provided to patient.     Wanda Forts, LPN  [phone]

## 2019-06-10 ENCOUNTER — Ambulatory Visit
Admission: RE | Admit: 2019-06-10 | Discharge: 2019-06-10 | Disposition: A | Discharge status: Home / Self Care | Payer: Medicare Other | Source: Ambulatory Visit | Attending: Family Medicine | Admitting: Family Medicine

## 2019-06-10 ENCOUNTER — Other Ambulatory Visit: Payer: Self-pay

## 2019-06-10 DIAGNOSIS — Z1231 Encounter for screening mammogram for malignant neoplasm of breast: Secondary | ICD-10-CM

## 2019-07-25 ENCOUNTER — Telehealth: Payer: Self-pay | Admitting: Family Medicine

## 2019-07-25 NOTE — Telephone Encounter (Signed)
Pt call to let dr Elease Hashimoto know she had her covid shot on 07/14/19 and will have to last one on 08/04/19 [facility]

## 2019-07-25 NOTE — Telephone Encounter (Signed)
Spoke with patient and 1st vaccine is documented in chart.  Patient will call back when the 2nd vaccine is completed.

## 2019-08-03 ENCOUNTER — Ambulatory Visit: Payer: Medicare Other

## 2019-08-04 ENCOUNTER — Telehealth: Payer: Self-pay | Admitting: Family Medicine

## 2019-08-04 NOTE — Telephone Encounter (Signed)
Pt wanted to inform you that she has received her #2 COVID vaccine. Thanks

## 2019-08-05 NOTE — Telephone Encounter (Signed)
Noted and documented in Immunizations

## 2019-09-01 DIAGNOSIS — Z23 Encounter for immunization: Secondary | ICD-10-CM | POA: Diagnosis not present

## 2019-11-06 ENCOUNTER — Telehealth (INDEPENDENT_AMBULATORY_CARE_PROVIDER_SITE_OTHER): Payer: Medicare Other | Admitting: Family Medicine

## 2019-11-06 ENCOUNTER — Encounter: Payer: Self-pay | Admitting: Family Medicine

## 2019-11-06 ENCOUNTER — Other Ambulatory Visit: Payer: Self-pay

## 2019-11-06 DIAGNOSIS — R197 Diarrhea, unspecified: Secondary | ICD-10-CM

## 2019-11-06 DIAGNOSIS — R112 Nausea with vomiting, unspecified: Secondary | ICD-10-CM

## 2019-11-06 DIAGNOSIS — M25559 Pain in unspecified hip: Secondary | ICD-10-CM

## 2019-11-06 NOTE — Progress Notes (Addendum)
Virtual Visit via Telephone Note  I connected with [name] on 11/12/19 at 10:20 AM EDT by telephone and verified that I am speaking with the correct person using two identifiers.   I discussed the limitations, risks, security and privacy concerns of performing an evaluation and management service by telephone and the availability of in person appointments. I also discussed with the patient that there may be a patient responsible charge related to this service. The patient expressed understanding and agreed to proceed.  Location patient: home, [place] Location provider: work or home office Participants present for the call: patient, provider, cleaning lady Patient did not have a visit in the prior 7 days to address this/these issue(s).   History of Present Illness:  Acute visit for stomach bug and tailbone pain: -stomach bug went through her cleaning lady's household last week -last night pt woke up last night feeling hot and had "gas pains" -had diarrhea and dry heaves over night (sometimes at the same time) -felt dizzy at one point and remembers had to hang onto her door frame at one point when going back and forth to the bathroom, she laid down on the floor of her bathroom at one point - she was aware and awake, but felt out of it. -reports she had difficulty sleeping after all of this because of some pain in the tailbone -today all of the vomiting, diarrhea and abdominal pain has resolved and is feeling much much better in terms of that, no dizziness and feels fine now, but still has tailbone pain -she was able to eat and drink normally today -she did not have any headaches, CP, SOB or anything like that overnight -she is fully vaccinated for COVID19 -she wonders if she can use tylenol or aleve for the tailbone, reports checked that the area and there is no swelling, redness, or bruising and has pain right on the tip of the tailbone and when sitting certain ways -she has a long car  ride coming up next week     Observations/Objective: Patient sounds cheerful and well on the phone. I do not appreciate any SOB. Speech and thought processing are grossly intact. Patient reported vitals:  Assessment and Plan:  Arthralgia of hip, unspecified laterality  Diarrhea, unspecified type  Nausea and vomiting, intractability of vomiting not specified, unspecified vomiting type  -we discussed possible serious and likely etiologies, options for evaluation and workup, limitations of telemedicine visit vs in person visit, treatment, treatment risks and precautions. Pt prefers to treat via telemedicine empirically rather then risking or undertaking an in person visit at this moment. It seems she likely picked up a stomach bug from her cleaning lady. I am not sure of the etiology of the tailbone pain - query viral, from sitting on hard bathroom floor or the possibility of a fall or injury that she does not recall because she was somewhat out of it during the night. Discussed various options for evaluation. She prefers to try OTC analgesic today if needed and seek in person care if not improving, worsening or other symptoms.   Follow Up Instructions:   I did not refer this patient for an OV in the next 24 hours for this/these issue(s).  I discussed the assessment and treatment plan with the patient. The patient was provided an opportunity to ask questions and all were answered. The patient agreed with the plan and demonstrated an understanding of the instructions.   The patient was advised to call back or seek  an in-person evaluation if the symptoms worsen or if the condition fails to improve as anticipated.  I provided 17 minutes of non-face-to-face time during this encounter.   [name], DO

## 2019-11-19 ENCOUNTER — Encounter: Payer: Self-pay | Admitting: Emergency Medicine

## 2019-11-19 ENCOUNTER — Telehealth: Payer: Self-pay | Admitting: Emergency Medicine

## 2019-11-19 ENCOUNTER — Telehealth: Payer: Self-pay | Admitting: Family Medicine

## 2019-11-19 ENCOUNTER — Emergency Department (INDEPENDENT_AMBULATORY_CARE_PROVIDER_SITE_OTHER)
Admission: EM | Admit: 2019-11-19 | Discharge: 2019-11-19 | Disposition: A | Discharge status: Home / Self Care | Payer: Medicare Other | Source: Home / Self Care | Attending: Family Medicine | Admitting: Family Medicine

## 2019-11-19 ENCOUNTER — Other Ambulatory Visit: Payer: Self-pay

## 2019-11-19 ENCOUNTER — Emergency Department (INDEPENDENT_AMBULATORY_CARE_PROVIDER_SITE_OTHER): Payer: Medicare Other

## 2019-11-19 DIAGNOSIS — R310 Gross hematuria: Secondary | ICD-10-CM

## 2019-11-19 DIAGNOSIS — R300 Dysuria: Secondary | ICD-10-CM

## 2019-11-19 DIAGNOSIS — N200 Calculus of kidney: Secondary | ICD-10-CM | POA: Diagnosis not present

## 2019-11-19 LAB — POCT URINALYSIS DIP (MANUAL ENTRY)
Bilirubin, UA: NEGATIVE
Glucose, UA: NEGATIVE mg/dL
Ketones, POC UA: NEGATIVE mg/dL
Leukocytes, UA: NEGATIVE
Nitrite, UA: NEGATIVE
Protein Ur, POC: >=300 mg/dL — AB
Spec Grav, UA: 1.025 (ref 1.010–1.025)
Urobilinogen, UA: 0.2 E.U./dL
pH, UA: 5.5 (ref 5.0–8.0)

## 2019-11-19 NOTE — Discharge Instructions (Signed)
If any of the following develop, proceed to the local emergency room: You develop back pain. You have a fever. You have nausea or vomiting. Your symptoms do not improve after 3 days. Your symptoms get worse.

## 2019-11-19 NOTE — ED Notes (Signed)
Duplicate entry

## 2019-11-19 NOTE — Telephone Encounter (Signed)
Transferred pt to Nurse Triage because she has had blood in urine after her fall last week.

## 2019-11-19 NOTE — ED Provider Notes (Signed)
[name] C[facility]    CSN: [id number] Arrival date & time: 11/19/19  1323      History   Chief Complaint Chief Complaint  Patient presents with  . Dysuria    HPI [name] is a 74 y.o. female.   Patient developed painless gross hematuria this morning, but feels well otherwise.  She has a past history of an assymptomatic kidney stone that required lithotripsy to remove.  She denies flank or abdominal pain and feels well otherwise. She reports that she had a viral gastroenteritis about two weeks ago, and a subsequent fall, but has recovered from those.  The history is provided by the patient.    Past Medical History:  Diagnosis Date  . Anxiety    aniety attacks  . Bronchitis   . Colon polyps   . Depression   . Hay fever    allergies  . History of kidney stones   . Migraines    not in 40 years    Patient Active Problem List   Diagnosis Date Noted  . Atrophic vaginitis 11/25/2018  . Transient insomnia 05/03/2015  . Distal radius fracture, right 11/03/2014  . Acute bronchitis 06/14/2011  . 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 HEMATURIA UNSPECIFIED 11/03/2009  . HYPERLIPIDEMIA 09/23/2009  . PERS HX TOBACCO USE PRESENTING HAZARDS HEALTH 09/23/2009  . OSTEOPENIA 10/05/2008  . Depression, recurrent ([place]) 09/17/2008    Past Surgical History:  Procedure Laterality Date  . BREAST BIOPSY  1990  . caesarean secion  1974, 1977, 1984  . CESAREAN SECTION  [phone]  . COLONOSCOPY W/ POLYPECTOMY     3   . OPEN REDUCTION INTERNAL FIXATION (ORIF) DISTAL RADIAL FRACTURE Right 11/03/2014   Procedure: OPEN REDUCTION INTERNAL FIXATION (ORIF) RIGHT DISTAL RADIAL FRACTURE WITH REPAIR AND RECONSTRUCTION;  Surgeon: Roseanne Kaufman, MD;  Location: [facility];  Service: Orthopedics;  Laterality: Right;    OB History   No obstetric history on file.      Home Medications    Prior to Admission medications   Medication Sig Start Date End Date Taking? Authorizing Provider    chlorpheniramine (CHLOR-TRIMETON) 4 MG tablet Take 4 mg by mouth 2 (two) times daily as needed for allergies.   Yes [provider]  estradiol (ESTRACE) 0.1 MG/GM vaginal cream INSERT 1 APPLICATORFUL VAGINALLY 3 TIMES A WEEK 02/18/19  Yes Burchette, Alinda Sierras, MD  FLUoxetine (PROZAC) 20 MG capsule TAKE 1 CAPSULE BY MOUTH EVERY DAY 11/25/18  Yes Burchette, Alinda Sierras, MD  Multiple Vitamin (MULTIVITAMIN) capsule Take 1 capsule by mouth daily.   Yes [provider]  nystatin cream (MYCOSTATIN) Apply 1 application topically 2 (two) times daily. 06/04/19   Burchette, Alinda Sierras, MD    Family History Family History  Problem Relation Age of Onset  . Arthritis Other   . Stroke Other   . Hypertension Other   . Depression Other   . Migraines Other   . Colon polyps Other   . Breast cancer Neg Hx     Social History Social History   Tobacco Use  . Smoking status: Former Smoker    Packs/day: 1.50    Years: 50.00    Pack years: 75.00    Types: Cigarettes    Quit date: 2012    Years since quitting: 9.4  . Smokeless tobacco: Never Used  . Tobacco comment: Former smoker  Substance Use Topics  . Alcohol use: Yes    Alcohol/week: 2.0 standard drinks    Types: 2 Glasses of  wine per week    Comment: 2 glasses of wine daily  . Drug use: No     Allergies   Aspirin   Review of Systems Review of Systems  Constitutional: Negative for activity change, appetite change, chills, diaphoresis, fatigue and fever.  Gastrointestinal: Negative for abdominal pain.  Genitourinary: Positive for hematuria. Negative for decreased urine volume, difficulty urinating, dysuria, flank pain, frequency, genital sores, pelvic pain and urgency.  All other systems reviewed and are negative.    Physical Exam Triage Vital Signs ED Triage Vitals [11/19/19 1350]  Enc Vitals Group     BP 127/80     Pulse Rate 77     Resp 17     Temp 98 F (36.7 C)     Temp Source Oral     SpO2 95 %     Weight       Height      Head Circumference      Peak Flow      Pain Score      Pain Loc      Pain Edu?      Excl. in [place]?    No data found.  Updated Vital Signs BP 127/80 (BP Location: Right Arm)   Pulse 77   Temp 98 F (36.7 C) (Oral)   Resp 17   SpO2 95%   Visual Acuity Right Eye Distance:   Left Eye Distance:   Bilateral Distance:    Right Eye Near:   Left Eye Near:    Bilateral Near:     Physical Exam Nursing notes and Vital Signs reviewed. Appearance:  Patient appears stated age, and in no acute distress.    Eyes:  Pupils are equal, round, and reactive to light and accomodation.  Extraocular movement is intact.  Conjunctivae are not inflamed   Pharynx:  Normal; moist mucous membranes  Neck:  Supple.  No adenopathy Lungs:  Clear to auscultation.  Breath sounds are equal.  Moving air well. Heart:  Regular rate and rhythm without murmurs, rubs, or gallops.  Abdomen:  Nontender without masses or hepatosplenomegaly.  Bowel sounds are present.  No CVA or flank tenderness.  Extremities:  No edema.  Skin:  No rash present.     UC Treatments / Results  Labs (all labs ordered are listed, but only abnormal results are displayed) Labs Reviewed  POCT URINALYSIS DIP (MANUAL ENTRY) - Abnormal; Notable for the following components:      Result Value   Color, UA other (*)    Clarity, UA cloudy (*)    Blood, UA large (*)    Protein Ur, POC >=300 (*)    All other components within normal limits  URINE CULTURE    EKG   Radiology CT Renal Stone Study  Result Date: 11/19/2019 CLINICAL DATA:  Hematuria EXAM: CT ABDOMEN AND PELVIS WITHOUT CONTRAST TECHNIQUE: Multidetector CT imaging of the abdomen and pelvis was performed following the standard protocol without IV contrast. COMPARISON:  2011 FINDINGS: Lower chest: No acute abnormality. Hepatobiliary: No focal liver abnormality is seen. Minimal calcification along the dependent gallbladder may reflect tiny gallstones. No gallbladder wall  thickening or biliary dilatation. Pancreas: Unremarkable. Spleen: Unremarkable. Adrenals/Urinary Tract: Adrenals are unremarkable. There is a 3 mm nonobstructing calculus of the upper pole of the left kidney. Ureters are unremarkable. Bladder is unremarkable. Stomach/Bowel: Stomach is within normal limits. Bowel is normal in caliber. Mild sigmoid diverticulosis. Normal appendix. Vascular/Lymphatic: Mild aortic atherosclerosis. Tortuosity of the upper abdominal aorta. Reproductive:  Uterus and bilateral adnexa are unremarkable. Other: Small fat containing umbilical hernia. Musculoskeletal: Similar appearance of degenerative changes of the lumbar spine. 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 No acute osseous abnormality. IMPRESSION: Small nonobstructing calculus of the left kidney. Question of tiny calcified gallstones. Electronically Signed   By: Macy Mis M.D.   On: 11/19/2019 15:35    Procedures Procedures (including critical care time)  Medications Ordered in UC Medications - No data to display  Initial Impression / Assessment and Plan / UC Course  I have reviewed the triage vital signs and the nursing notes.  Pertinent labs & imaging results that were available during my care of the patient were reviewed by me and considered in my medical decision making (see chart for details).    Hematuria could be resulting from small non-obstructing stone in left kidney, but other causes must be ruled out. Urine culture pending.  Will refer to [facility]   Final Clinical Impressions(s) / UC Diagnoses   Final diagnoses:  Gross hematuria     Discharge Instructions     If any of the following develop, proceed to the local emergency room:  You develop back pain.  You have a fever.  You have nausea or vomiting.  Your symptoms do not improve after 3 days.  Your symptoms get worse.    ED Prescriptions    None        Kandra Nicolas,  MD 11/21/19 1414

## 2019-11-19 NOTE — Telephone Encounter (Signed)
Urgent Care notes to be faxed to [facility] once available. Message left for Dr Assunta Found to have clinical staff fax to [facility] once notes are complete. Fax # [phone]

## 2019-11-19 NOTE — ED Triage Notes (Signed)
Blood in urine this am - unable to see PCP -pt was sick 2 weeks w/ a virus diagnosed via a phone visit per pt  -pt was dizzy w/ virus & pain to coccyx (this pain remains) -hx of kidney stones  COVID vaccine pfizer

## 2019-11-20 LAB — URINE CULTURE
MICRO NUMBER:: 10544504
SPECIMEN QUALITY:: ADEQUATE

## 2020-01-06 DIAGNOSIS — N3941 Urge incontinence: Secondary | ICD-10-CM | POA: Diagnosis not present

## 2020-01-06 DIAGNOSIS — R310 Gross hematuria: Secondary | ICD-10-CM | POA: Diagnosis not present

## 2020-01-21 DIAGNOSIS — D414 Neoplasm of uncertain behavior of bladder: Secondary | ICD-10-CM | POA: Diagnosis not present

## 2020-01-22 ENCOUNTER — Other Ambulatory Visit: Payer: Self-pay | Admitting: Urology

## 2020-01-26 ENCOUNTER — Encounter (HOSPITAL_BASED_OUTPATIENT_CLINIC_OR_DEPARTMENT_OTHER): Payer: Self-pay | Admitting: Urology

## 2020-01-26 ENCOUNTER — Other Ambulatory Visit: Payer: Self-pay

## 2020-01-26 NOTE — Progress Notes (Signed)
Spoke w/ via phone for pre-op interview---PT Lab needs dos---- none              Lab results------chest ct 02-27-2019 epic COVID test ------01-31-2020 at 1000 Arrive at -------900 am 02-04-2020 NPO after MN NO Solid Food.  Clear liquids from MN until---800 am then npo Medications to take morning of surgery -----fluoxetine Diabetic medication -----n/a Patient Special Instructions -----none Pre-Op special Istructions -----none Patient verbalized understanding of instructions that were given at this phone interview. Patient denies shortness of breath, chest pain, fever, cough at this phone interview.

## 2020-01-31 ENCOUNTER — Other Ambulatory Visit (HOSPITAL_COMMUNITY)
Admission: RE | Admit: 2020-01-31 | Discharge: 2020-01-31 | Disposition: A | Discharge status: Home / Self Care | Payer: Medicare Other | Source: Ambulatory Visit | Attending: Urology | Admitting: Urology

## 2020-01-31 DIAGNOSIS — Z20822 Contact with and (suspected) exposure to covid-19: Secondary | ICD-10-CM | POA: Diagnosis not present

## 2020-01-31 DIAGNOSIS — Z01812 Encounter for preprocedural laboratory examination: Secondary | ICD-10-CM | POA: Diagnosis not present

## 2020-01-31 LAB — SARS CORONAVIRUS 2 (TAT 6-24 HRS): SARS Coronavirus 2: NEGATIVE

## 2020-02-03 NOTE — Anesthesia Preprocedure Evaluation (Addendum)
Anesthesia Evaluation  Patient identified by MRN, date of birth, ID band Patient awake    Reviewed: Allergy & Precautions, NPO status , Patient's Chart, lab work & pertinent test results  Airway Mallampati: II  TM Distance: >3 FB Neck ROM: Full    Dental no notable dental hx. (+) Teeth Intact, Dental Advisory Given   Pulmonary former smoker,    Pulmonary exam normal breath sounds clear to auscultation       Cardiovascular Exercise Tolerance: Good negative cardio ROS Normal cardiovascular exam Rhythm:Regular Rate:Normal     Neuro/Psych  Headaches, PSYCHIATRIC DISORDERS Anxiety Depression    GI/Hepatic negative GI ROS, Neg liver ROS,   Endo/Other  negative endocrine ROS  Renal/GU Renal disease     Musculoskeletal negative musculoskeletal ROS (+)   Abdominal   Peds  Hematology negative hematology ROS (+) Lab Results      Component                Value               Date                      WBC                      5.2                 11/03/2014                HGB                      13.4                11/03/2014                HCT                      41.0                11/03/2014                MCV                      94.3                11/03/2014                PLT                      268                 11/03/2014              Anesthesia Other Findings   Reproductive/Obstetrics                            Anesthesia Physical Anesthesia Plan  ASA: II  Anesthesia Plan: General   Post-op Pain Management:    Induction: Intravenous  PONV Risk Score and Plan: 4 or greater and Treatment may vary due to age or medical condition, Ondansetron and Dexamethasone  Airway Management Planned: LMA  Additional Equipment: None  Intra-op Plan:   Post-operative Plan:   Informed Consent: I have reviewed the patients History and Physical, chart, labs and discussed the procedure including  the risks, benefits and alternatives for the proposed anesthesia with the patient or  authorized representative who has indicated his/her understanding and acceptance.     Dental advisory given  Plan Discussed with: CRNA and Anesthesiologist  Anesthesia Plan Comments:        Anesthesia Quick Evaluation

## 2020-02-04 ENCOUNTER — Ambulatory Visit (HOSPITAL_BASED_OUTPATIENT_CLINIC_OR_DEPARTMENT_OTHER): Payer: Medicare Other | Admitting: Anesthesiology

## 2020-02-04 ENCOUNTER — Other Ambulatory Visit: Payer: Self-pay

## 2020-02-04 ENCOUNTER — Encounter (HOSPITAL_BASED_OUTPATIENT_CLINIC_OR_DEPARTMENT_OTHER)
Admission: RE | Disposition: A | Discharge status: Home / Self Care | Payer: Self-pay | Source: Home / Self Care | Attending: Urology

## 2020-02-04 ENCOUNTER — Ambulatory Visit (HOSPITAL_BASED_OUTPATIENT_CLINIC_OR_DEPARTMENT_OTHER)
Admission: RE | Admit: 2020-02-04 | Discharge: 2020-02-04 | Disposition: A | Discharge status: Home / Self Care | Payer: Medicare Other | Attending: Urology | Admitting: Urology

## 2020-02-04 ENCOUNTER — Encounter (HOSPITAL_BASED_OUTPATIENT_CLINIC_OR_DEPARTMENT_OTHER): Payer: Self-pay | Admitting: Urology

## 2020-02-04 DIAGNOSIS — Z87891 Personal history of nicotine dependence: Secondary | ICD-10-CM | POA: Diagnosis not present

## 2020-02-04 DIAGNOSIS — F419 Anxiety disorder, unspecified: Secondary | ICD-10-CM | POA: Insufficient documentation

## 2020-02-04 DIAGNOSIS — Z79890 Hormone replacement therapy (postmenopausal): Secondary | ICD-10-CM | POA: Insufficient documentation

## 2020-02-04 DIAGNOSIS — F329 Major depressive disorder, single episode, unspecified: Secondary | ICD-10-CM | POA: Diagnosis not present

## 2020-02-04 DIAGNOSIS — E785 Hyperlipidemia, unspecified: Secondary | ICD-10-CM | POA: Diagnosis not present

## 2020-02-04 DIAGNOSIS — D090 Carcinoma in situ of bladder: Secondary | ICD-10-CM | POA: Diagnosis not present

## 2020-02-04 DIAGNOSIS — C676 Malignant neoplasm of ureteric orifice: Secondary | ICD-10-CM | POA: Insufficient documentation

## 2020-02-04 DIAGNOSIS — J301 Allergic rhinitis due to pollen: Secondary | ICD-10-CM | POA: Insufficient documentation

## 2020-02-04 DIAGNOSIS — Z79899 Other long term (current) drug therapy: Secondary | ICD-10-CM | POA: Insufficient documentation

## 2020-02-04 DIAGNOSIS — D494 Neoplasm of unspecified behavior of bladder: Secondary | ICD-10-CM | POA: Diagnosis not present

## 2020-02-04 DIAGNOSIS — F418 Other specified anxiety disorders: Secondary | ICD-10-CM | POA: Diagnosis not present

## 2020-02-04 DIAGNOSIS — D419 Neoplasm of uncertain behavior of unspecified urinary organ: Secondary | ICD-10-CM | POA: Diagnosis not present

## 2020-02-04 HISTORY — PX: CYSTOSCOPY W/ RETROGRADES: SHX1426

## 2020-02-04 HISTORY — DX: Neoplasm of unspecified behavior of bladder: D49.4

## 2020-02-04 HISTORY — DX: Rash and other nonspecific skin eruption: R21

## 2020-02-04 HISTORY — PX: TRANSURETHRAL RESECTION OF BLADDER TUMOR WITH MITOMYCIN-C: SHX6459

## 2020-02-04 SURGERY — TRANSURETHRAL RESECTION OF BLADDER TUMOR WITH MITOMYCIN-C
Anesthesia: General | Site: Ureter

## 2020-02-04 MED ORDER — EPHEDRINE SULFATE-NACL 50-0.9 MG/10ML-% IV SOSY
PREFILLED_SYRINGE | INTRAVENOUS | Status: DC | PRN
  Administered 2020-02-04: 10 mg via INTRAVENOUS

## 2020-02-04 MED ORDER — LACTATED RINGERS IV SOLN: INTRAVENOUS | Status: DC

## 2020-02-04 MED ORDER — EPHEDRINE 5 MG/ML INJ
INTRAVENOUS | Status: AC
  Filled 2020-02-04: qty 10

## 2020-02-04 MED ORDER — GEMCITABINE CHEMO FOR BLADDER INSTILLATION 2000 MG
2000.0000 mg | Freq: Once | INTRAVENOUS | Status: AC
  Administered 2020-02-04: 2000 mg via INTRAVESICAL
  Filled 2020-02-04: qty 2000

## 2020-02-04 MED ORDER — PHENYLEPHRINE 40 MCG/ML (10ML) SYRINGE FOR IV PUSH (FOR BLOOD PRESSURE SUPPORT)
PREFILLED_SYRINGE | INTRAVENOUS | Status: AC
  Filled 2020-02-04: qty 10

## 2020-02-04 MED ORDER — LIDOCAINE 2% (20 MG/ML) 5 ML SYRINGE
INTRAMUSCULAR | Status: DC | PRN
  Administered 2020-02-04: 60 mg via INTRAVENOUS

## 2020-02-04 MED ORDER — FENTANYL CITRATE (PF) 100 MCG/2ML IJ SOLN
25.0000 ug | INTRAMUSCULAR | Status: DC | PRN
  Administered 2020-02-04 (×2): 25 ug via INTRAVENOUS

## 2020-02-04 MED ORDER — ONDANSETRON HCL 4 MG/2ML IJ SOLN
INTRAMUSCULAR | Status: DC | PRN
  Administered 2020-02-04: 4 mg via INTRAVENOUS

## 2020-02-04 MED ORDER — FENTANYL CITRATE (PF) 100 MCG/2ML IJ SOLN
INTRAMUSCULAR | Status: DC | PRN
  Administered 2020-02-04: 50 ug via INTRAVENOUS

## 2020-02-04 MED ORDER — FENTANYL CITRATE (PF) 100 MCG/2ML IJ SOLN
INTRAMUSCULAR | Status: AC
  Filled 2020-02-04: qty 2

## 2020-02-04 MED ORDER — PROPOFOL 10 MG/ML IV BOLUS
INTRAVENOUS | Status: DC | PRN
  Administered 2020-02-04: 130 mg via INTRAVENOUS

## 2020-02-04 MED ORDER — LIDOCAINE 2% (20 MG/ML) 5 ML SYRINGE
INTRAMUSCULAR | Status: AC
  Filled 2020-02-04: qty 5

## 2020-02-04 MED ORDER — HYDROCODONE-ACETAMINOPHEN 5-325 MG PO TABS: 1.0000 | ORAL_TABLET | ORAL | 0 refills | Status: DC | PRN

## 2020-02-04 MED ORDER — ONDANSETRON HCL 4 MG/2ML IJ SOLN
INTRAMUSCULAR | Status: AC
  Filled 2020-02-04: qty 2

## 2020-02-04 MED ORDER — PHENYLEPHRINE 40 MCG/ML (10ML) SYRINGE FOR IV PUSH (FOR BLOOD PRESSURE SUPPORT)
PREFILLED_SYRINGE | INTRAVENOUS | Status: DC | PRN
  Administered 2020-02-04: 80 ug via INTRAVENOUS

## 2020-02-04 MED ORDER — PROPOFOL 10 MG/ML IV BOLUS
INTRAVENOUS | Status: AC
  Filled 2020-02-04: qty 20

## 2020-02-04 MED ORDER — ACETAMINOPHEN 10 MG/ML IV SOLN: 1000.0000 mg | Freq: Once | INTRAVENOUS | Status: DC | PRN

## 2020-02-04 MED ORDER — SODIUM CHLORIDE 0.9 % IR SOLN
Status: DC | PRN
  Administered 2020-02-04: 2400 mL via INTRAVESICAL

## 2020-02-04 MED ORDER — ONDANSETRON HCL 4 MG/2ML IJ SOLN: 4.0000 mg | Freq: Once | INTRAMUSCULAR | Status: DC | PRN

## 2020-02-04 MED ORDER — CEFAZOLIN SODIUM-DEXTROSE 2-4 GM/100ML-% IV SOLN
INTRAVENOUS | Status: AC
  Filled 2020-02-04: qty 100

## 2020-02-04 MED ORDER — CEFAZOLIN SODIUM-DEXTROSE 2-4 GM/100ML-% IV SOLN
2.0000 g | INTRAVENOUS | Status: AC
  Administered 2020-02-04: 2 g via INTRAVENOUS

## 2020-02-04 SURGICAL SUPPLY — 30 items
BAG DRAIN URO-CYSTO SKYTR STRL (DRAIN) ×4 IMPLANT
BAG DRN RND TRDRP ANRFLXCHMBR (UROLOGICAL SUPPLIES) ×2
BAG DRN UROCATH (DRAIN) ×2
BAG URINE DRAIN 2000ML AR STRL (UROLOGICAL SUPPLIES) ×2 IMPLANT
BAG URINE LEG 500ML (DRAIN) IMPLANT
CATH FOLEY 2WAY SLVR  5CC 18FR (CATHETERS) ×4
CATH FOLEY 2WAY SLVR  5CC 22FR (CATHETERS)
CATH FOLEY 2WAY SLVR 30CC 20FR (CATHETERS) IMPLANT
CATH FOLEY 2WAY SLVR 5CC 18FR (CATHETERS) IMPLANT
CATH FOLEY 2WAY SLVR 5CC 22FR (CATHETERS) IMPLANT
CATH URET 5FR 28IN OPEN ENDED (CATHETERS) ×2 IMPLANT
CLOTH BEACON ORANGE TIMEOUT ST (SAFETY) ×4 IMPLANT
ELECT REM PT RETURN 9FT ADLT (ELECTROSURGICAL) ×4
ELECTRODE REM PT RTRN 9FT ADLT (ELECTROSURGICAL) IMPLANT
EVACUATOR MICROVAS BLADDER (UROLOGICAL SUPPLIES) IMPLANT
GLOVE BIO SURGEON STRL SZ7.5 (GLOVE) ×4 IMPLANT
GOWN STRL REUS W/ TWL LRG LVL3 (GOWN DISPOSABLE) ×2 IMPLANT
GOWN STRL REUS W/TWL LRG LVL3 (GOWN DISPOSABLE) ×8
HOLDER FOLEY CATH W/STRAP (MISCELLANEOUS) ×2 IMPLANT
IV NS IRRIG 3000ML ARTHROMATIC (IV SOLUTION) ×4 IMPLANT
KIT TURNOVER CYSTO (KITS) ×4 IMPLANT
LOOP CUT BIPOLAR 24F LRG (ELECTROSURGICAL) ×2 IMPLANT
MANIFOLD NEPTUNE II (INSTRUMENTS) ×4 IMPLANT
PACK CYSTO (CUSTOM PROCEDURE TRAY) ×4 IMPLANT
PLUG CATH AND CAP STER (CATHETERS) ×2 IMPLANT
SYR TOOMEY IRRIG 70ML (MISCELLANEOUS)
SYRINGE TOOMEY IRRIG 70ML (MISCELLANEOUS) IMPLANT
TUBE CONNECTING 12'X1/4 (SUCTIONS) ×1
TUBE CONNECTING 12X1/4 (SUCTIONS) ×1 IMPLANT
WATER STERILE IRR 500ML POUR (IV SOLUTION) ×2 IMPLANT

## 2020-02-04 NOTE — Anesthesia Postprocedure Evaluation (Signed)
Anesthesia Post Note  Patient: [name]  Procedure(s) Performed: TRANSURETHRAL RESECTION OF BLADDER TUMOR WITH POST OPERATIVE GEMCITABINE (N/A Bladder) CYSTOSCOPY WITH RETROGRADE PYELOGRAM (Bilateral Ureter)     Patient location during evaluation: PACU Anesthesia Type: General Level of consciousness: awake and alert Pain management: pain level controlled Vital Signs Assessment: post-procedure vital signs reviewed and stable Respiratory status: spontaneous breathing, nonlabored ventilation, respiratory function stable and patient connected to nasal cannula oxygen Cardiovascular status: blood pressure returned to baseline and stable Postop Assessment: no apparent nausea or vomiting Anesthetic complications: no   No complications documented.  Last Vitals:  Vitals:   02/04/20 1230 02/04/20 1309  BP: (!) 151/87 (!) 145/84  Pulse: 64 67  Resp: 10 16  Temp:  36.8 C  SpO2: 96% 95%    Last Pain:  Vitals:   02/04/20 1309  TempSrc: Oral  PainSc:                  Barnet [place]

## 2020-02-04 NOTE — Anesthesia Procedure Notes (Signed)
Procedure Name: LMA Insertion Date/Time: 02/04/2020 10:21 AM Performed by: Niel Hummer, CRNA Pre-anesthesia Checklist: Patient identified, Emergency Drugs available, Suction available and Patient being monitored Patient Re-evaluated:Patient Re-evaluated prior to induction Oxygen Delivery Method: Circle system utilized Preoxygenation: Pre-oxygenation with 100% oxygen Induction Type: IV induction LMA: LMA with gastric port inserted LMA Size: 4.0 Number of attempts: 1 Dental Injury: Teeth and Oropharynx as per pre-operative assessment

## 2020-02-04 NOTE — Discharge Instructions (Addendum)
Transurethral Resection of Bladder Tumor (TURBT) or Bladder Biopsy   Definition:  Transurethral Resection of the Bladder Tumor is a surgical procedure used to diagnose and remove tumors within the bladder. TURBT is the most common treatment for early stage bladder cancer.  General instructions:     Your recent bladder surgery requires very little post hospital care but some definite precautions.  Despite the fact that no skin incisions were used, the area around the bladder incisions are raw and covered with scabs to promote healing and prevent bleeding. Certain precautions are needed to insure that the scabs are not disturbed over the next 2-4 weeks while the healing proceeds.  Because the raw surface inside your bladder and the irritating effects of urine you may expect frequency of urination and/or urgency (a stronger desire to urinate) and perhaps even getting up at night more often. This will usually resolve or improve slowly over the healing period. You may see some blood in your urine over the first 6 weeks. Do not be alarmed, even if the urine was clear for a while. Get off your feet and drink lots of fluids until clearing occurs. If you start to pass clots or don't improve call us.  Diet:  You may return to your normal diet immediately. Because of the raw surface of your bladder, alcohol, spicy foods, foods high in acid and drinks with caffeine may cause irritation or frequency and should be used in moderation. To keep your urine flowing freely and avoid constipation, drink plenty of fluids during the day (8-10 glasses). Tip: Avoid cranberry juice because it is very acidic.  Activity:  Your physical activity doesn't need to be restricted. However, if you are very active, you may see some blood in the urine. We suggest that you reduce your activity under the circumstances until the bleeding has stopped.  Bowels:  It is important to keep your bowels regular during the postoperative  period. Straining with bowel movements can cause bleeding. A bowel movement every other day is reasonable. Use a mild laxative if needed, such as milk of magnesia 2-3 tablespoons, or 2 Dulcolax tablets. Call if you continue to have problems. If you had been taking narcotics for pain, before, during or after your surgery, you may be constipated. Take a laxative if necessary.    Medication:  You should resume your pre-surgery medications unless told not to. In addition you may be given an antibiotic to prevent or treat infection. Antibiotics are not always necessary. All medication should be taken as prescribed until the bottles are finished unless you are having an unusual reaction to one of the drugs.   Post Anesthesia Home Care Instructions  Activity: Get plenty of rest for the remainder of the day. A responsible individual must stay with you for 24 hours following the procedure.  For the next 24 hours, DO NOT: -Drive a car -Operate machinery -Drink alcoholic beverages -Take any medication unless instructed by your physician -Make any legal decisions or sign important papers.  Meals: Start with liquid foods such as gelatin or soup. Progress to regular foods as tolerated. Avoid greasy, spicy, heavy foods. If nausea and/or vomiting occur, drink only clear liquids until the nausea and/or vomiting subsides. Call your physician if vomiting continues.  Special Instructions/Symptoms: Your throat may feel dry or sore from the anesthesia or the breathing tube placed in your throat during surgery. If this causes discomfort, gargle with warm salt water. The discomfort should disappear within 24 hours.       

## 2020-02-04 NOTE — H&P (Signed)
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  She presents for transurethral resection of bladder tumor with bilateral retrograde pyelogram, instillation of gemcitabine.  Past Medical History:  Diagnosis Date  . Anxiety    aniety attacks  . Bladder tumor   . Bronchitis   . Colon polyps   . Depression   . Hay fever    allergies  . History of kidney stones   . Migraines    not in 40 years  . Rash    buttock x 1 year nystatin prn   Past Surgical History:  Procedure Laterality Date  . BREAST BIOPSY  1990  . caesarean secion  1974, 1977, 1984  . CESAREAN SECTION  [phone]  . COLONOSCOPY W/ POLYPECTOMY  last done 2016    x 3   . OPEN REDUCTION INTERNAL FIXATION (ORIF) DISTAL RADIAL FRACTURE Right 11/03/2014   Procedure: OPEN REDUCTION INTERNAL FIXATION (ORIF) RIGHT DISTAL RADIAL FRACTURE WITH REPAIR AND RECONSTRUCTION;  Surgeon: Roseanne Kaufman, MD;  Location: [facility];  Service: Orthopedics;  Laterality: Right;    Home Medications:  Medications Prior to Admission  Medication Sig Dispense Refill Last Dose  . chlorpheniramine (CHLOR-TRIMETON) 4 MG tablet Take 4 mg by mouth 2 (two) times daily as needed for allergies.   02/04/2020 at [place]  . estradiol (ESTRACE) 0.1 MG/GM vaginal cream INSERT 1 APPLICATORFUL VAGINALLY 3 TIMES A WEEK 42.5 g 5 Past Week at Unknown time  . FLUoxetine (PROZAC) 20 MG capsule TAKE 1 CAPSULE BY MOUTH EVERY DAY 90 capsule 3 02/04/2020 at 0715  . Multiple Vitamin (MULTIVITAMIN) capsule Take 1 capsule by mouth daily.   Past Month at Unknown time  . nystatin cream (MYCOSTATIN) Apply 1 application topically 2 (two) times daily. 30 g 2 Past Week at Unknown time   Allergies:  Allergies  Allergen Reactions  . Aspirin     REACTION: vasculitis    Family History  Problem Relation Age of Onset  . Arthritis Other   . Stroke Other   .  Hypertension Other   . Depression Other   . Migraines Other   . Colon polyps Other   . Breast cancer Neg Hx    Social History:  reports that she quit smoking about 9 years ago. Her smoking use included cigarettes. She has a 75.00 pack-year smoking history. She has never used smokeless tobacco. She reports current alcohol use of about 2.0 standard drinks of alcohol per week. She reports that she does not use drugs.  ROS: A complete review of systems was performed.  All systems are negative except for pertinent findings as noted. ROS   Physical Exam:  Vital signs in last 24 hours: Temp:  [97.8 F (36.6 C)] 97.8 F (36.6 C) (08/18 0919) Pulse Rate:  [72] 72 (08/18 0919) Resp:  [166] 166 (08/18 0919) BP: (147)/(87) 147/87 (08/18 0919) SpO2:  [96 %] 96 % (08/18 0919) Weight:  [75.8 kg] 75.8 kg (08/18 0919) General:  Alert and oriented, No acute distress HEENT: Normocephalic, atraumatic Neck: No JVD or lymphadenopathy Cardiovascular: Regular rate and rhythm Lungs: Regular rate and effort Abdomen: Soft, nontender, nondistended, no abdominal masses Back: No CVA tenderness Extremities: No edema Neurologic: Grossly intact  Laboratory Data:  No results found for this or any previous visit (from the past 24 hour(s)). Recent Results (from the past 240 hour(s))  SARS CORONAVIRUS 2 (TAT 6-24 HRS)  Nasopharyngeal Nasopharyngeal Swab     Status: None   Collection Time: 01/31/20  2:40 PM   Specimen: Nasopharyngeal Swab  Result Value Ref Range Status   SARS Coronavirus 2 NEGATIVE NEGATIVE Final    Comment: (NOTE) SARS-CoV-2 target nucleic acids are NOT DETECTED.  The SARS-CoV-2 RNA is generally detectable in upper and lower respiratory specimens during the acute phase of infection. Negative results do not preclude SARS-CoV-2 infection, do not rule out co-infections with other pathogens, and should not be used as the sole basis for treatment or other patient management decisions. Negative  results must be combined with clinical observations, patient history, and epidemiological information. The expected result is Negative.  Fact Sheet for Patients: SugarRoll.be  Fact Sheet for Healthcare Providers: https://www.woods-mathews.com/  This test is not yet approved or cleared by the [place] FDA and  has been authorized for detection and/or diagnosis of SARS-CoV-2 by FDA under an Emergency Use Authorization (EUA). This EUA will remain  in effect (meaning this test can be used) for the duration of the COVID-19 declaration under Se ction 564(b)(1) of the Act, 21 U.S.C. section 360bbb-3(b)(1), unless the authorization is terminated or revoked sooner.  Performed at [facility], [place] [address] [place], [place] [address]    Creatinine: No results for input(s): CREATININE in the last 168 hours.  Impression/Assessment:  Gross hematuria, bladder tumor  Plan:  Proceed with TURBT with instillation of gemcitabine and bilateral retrograde pyelogram.  Risk and benefits discussed.  Marton Redwood, III 02/04/2020, 10:12 AM

## 2020-02-04 NOTE — Op Note (Signed)
Operative Note  Preoperative diagnosis:  1.  Bladder tumor  Postoperative diagnosis: 1.  Bladder tumor--small  Procedure(s): 1.  Transurethral resection of bladder tumor--small 2.  Intravesical instillation of gemcitabine 3.  Bilateral retrograde pyelogram  Surgeon: Link Snuffer, MD  Assistants: None  Anesthesia: General  Complications: None immediate  EBL: Minimal  Specimens: 1.  Bladder tumor  Drains/Catheters: 1.  18 French Foley catheter  Intraoperative findings: 1.  Normal urethra 2.  Bilateral retrograde pyelogram revealed no evidence of any filling defect or hydronephrosis bilaterally.  They were normal. 3.  Bilateral ureteral orifices were normal.  Around the left ureteral orifice, there were 3 small superficial appearing bladder tumors that were papillary in nature.  All 3 were less than 1 cm.  Indication: 74 year old female evaluated for gross hematuria found to have small bladder tumors in the bladder.  She presents for the previously mentioned operation.  Description of procedure:  The patient was identified and consent was obtained.  The patient was taken to the operating room and placed in the supine position.  The patient was placed under general anesthesia.  Perioperative antibiotics were administered.  The patient was placed in dorsal lithotomy.  Patient was prepped and draped in a standard sterile fashion and a timeout was performed.  A 21 French rigid cystoscope was advanced into the urethra and into the bladder.  Complete cystoscopy was performed with the findings noted above.  I cannulated the left ureteral orifice with an open-ended ureteral catheter and shot a retrograde pyelogram with findings noted above.  There were no abnormalities.  I then cannulated the right ureteral orifice with an open-ended ureteral catheter and shot a retrograde pyelogram again with no abnormal findings.  I withdrew the scope and placed a 94 French resectoscope with a visual  obturator in place into the urethra and into the bladder.  I exchanged this for the bipolar working element.  On bipolar settings, I carefully resected the tumors of interest.  Resection and fulguration were close to the left ureteral orifice but the orifice itself was spared.  Bladder tumor was collected for permanent specimen.  I fulgurated the resection bed.  There was no active bleeding noted.  No other lesions or masses seen.  I therefore withdrew the scope and placed an 32 French Foley catheter.  Patient tolerated the procedure well and was stable postoperative.  In the PACU, I instilled intravesical gemcitabine.  This remained for approximately 1 hour prior to proper disposal.  Plan: Follow-up in 1 week for pathology review.

## 2020-02-04 NOTE — Interval H&P Note (Signed)
History and Physical Interval Note:  02/04/2020 10:13 AM  [name]  has presented today for surgery, with the diagnosis of BLADDER TUMOR.  The various methods of treatment have been discussed with the patient and family. After consideration of risks, benefits and other options for treatment, the patient has consented to  Procedure(s): TRANSURETHRAL RESECTION OF BLADDER TUMOR WITH GEMCITABINE (N/A) as a surgical intervention.  The patient's history has been reviewed, patient examined, no change in status, stable for surgery.  I have reviewed the patient's chart and labs.  Questions were answered to the patient's satisfaction.     Marton Redwood, III

## 2020-02-04 NOTE — Transfer of Care (Signed)
Immediate Anesthesia Transfer of Care Note  Patient: [name]  Procedure(s) Performed: TRANSURETHRAL RESECTION OF BLADDER TUMOR WITH POST OPERATIVE GEMCITABINE (N/A Bladder) CYSTOSCOPY WITH RETROGRADE PYELOGRAM (Bilateral Ureter)  Patient Location: PACU  Anesthesia Type:General  Level of Consciousness: awake, alert  and oriented  Airway & Oxygen Therapy: Patient Spontanous Breathing and Patient connected to face mask oxygen  Post-op Assessment: Report given to RN, Post -op Vital signs reviewed and stable and Patient moving all extremities X 4  Post vital signs: Reviewed and stable  Last Vitals:  Vitals Value Taken Time  BP    Temp    Pulse 81 02/04/20 1051  Resp 13 02/04/20 1051  SpO2 96 % 02/04/20 1051  Vitals shown include unvalidated device data.  Last Pain:  Vitals:   02/04/20 0919  TempSrc: Oral  PainSc: 0-No pain      Patients Stated Pain Goal: 5 (56/70/14 1030)  Complications: No complications documented.

## 2020-02-05 ENCOUNTER — Encounter (HOSPITAL_BASED_OUTPATIENT_CLINIC_OR_DEPARTMENT_OTHER): Payer: Self-pay | Admitting: Urology

## 2020-02-05 LAB — SURGICAL PATHOLOGY

## 2020-02-11 DIAGNOSIS — C678 Malignant neoplasm of overlapping sites of bladder: Secondary | ICD-10-CM | POA: Diagnosis not present

## 2020-02-14 DIAGNOSIS — Z23 Encounter for immunization: Secondary | ICD-10-CM | POA: Diagnosis not present

## 2020-02-16 DIAGNOSIS — Z23 Encounter for immunization: Secondary | ICD-10-CM | POA: Diagnosis not present

## 2020-02-18 ENCOUNTER — Telehealth: Payer: Self-pay | Admitting: Family Medicine

## 2020-02-18 NOTE — Telephone Encounter (Signed)
Pt called to inform Dr. Elease Hashimoto  That she received her 3rd COVID vaccine shot.  Is you have any questions to please call her

## 2020-02-18 NOTE — Telephone Encounter (Signed)
Spoke to pt and advised to upload the card or brig it to her next appt. Pt verbalized understanding. Pt wanted Dr.Burchette to know that she was dx with bladder cancer 02/04/2020. Pt stated she had the surgery already.

## 2020-02-19 DIAGNOSIS — H524 Presbyopia: Secondary | ICD-10-CM | POA: Diagnosis not present

## 2020-02-19 DIAGNOSIS — H43813 Vitreous degeneration, bilateral: Secondary | ICD-10-CM | POA: Diagnosis not present

## 2020-02-19 DIAGNOSIS — H25813 Combined forms of age-related cataract, bilateral: Secondary | ICD-10-CM | POA: Diagnosis not present

## 2020-02-19 DIAGNOSIS — H52203 Unspecified astigmatism, bilateral: Secondary | ICD-10-CM | POA: Diagnosis not present

## 2020-02-19 NOTE — Telephone Encounter (Signed)
Noted  

## 2020-03-01 DIAGNOSIS — C678 Malignant neoplasm of overlapping sites of bladder: Secondary | ICD-10-CM | POA: Diagnosis not present

## 2020-03-01 DIAGNOSIS — Z5111 Encounter for antineoplastic chemotherapy: Secondary | ICD-10-CM | POA: Diagnosis not present

## 2020-03-08 DIAGNOSIS — C678 Malignant neoplasm of overlapping sites of bladder: Secondary | ICD-10-CM | POA: Diagnosis not present

## 2020-03-08 DIAGNOSIS — Z5111 Encounter for antineoplastic chemotherapy: Secondary | ICD-10-CM | POA: Diagnosis not present

## 2020-03-15 DIAGNOSIS — C678 Malignant neoplasm of overlapping sites of bladder: Secondary | ICD-10-CM | POA: Diagnosis not present

## 2020-03-15 DIAGNOSIS — Z5111 Encounter for antineoplastic chemotherapy: Secondary | ICD-10-CM | POA: Diagnosis not present

## 2020-03-22 DIAGNOSIS — C678 Malignant neoplasm of overlapping sites of bladder: Secondary | ICD-10-CM | POA: Diagnosis not present

## 2020-03-22 DIAGNOSIS — Z5111 Encounter for antineoplastic chemotherapy: Secondary | ICD-10-CM | POA: Diagnosis not present

## 2020-03-29 DIAGNOSIS — Z5111 Encounter for antineoplastic chemotherapy: Secondary | ICD-10-CM | POA: Diagnosis not present

## 2020-03-29 DIAGNOSIS — C678 Malignant neoplasm of overlapping sites of bladder: Secondary | ICD-10-CM | POA: Diagnosis not present

## 2020-04-05 DIAGNOSIS — C678 Malignant neoplasm of overlapping sites of bladder: Secondary | ICD-10-CM | POA: Diagnosis not present

## 2020-04-05 DIAGNOSIS — Z5111 Encounter for antineoplastic chemotherapy: Secondary | ICD-10-CM | POA: Diagnosis not present

## 2020-04-06 DIAGNOSIS — Z20822 Contact with and (suspected) exposure to covid-19: Secondary | ICD-10-CM | POA: Diagnosis not present

## 2020-04-26 ENCOUNTER — Other Ambulatory Visit: Payer: Self-pay | Admitting: Family Medicine

## 2020-04-26 DIAGNOSIS — Z1231 Encounter for screening mammogram for malignant neoplasm of breast: Secondary | ICD-10-CM

## 2020-05-18 DIAGNOSIS — C678 Malignant neoplasm of overlapping sites of bladder: Secondary | ICD-10-CM | POA: Diagnosis not present

## 2020-06-14 ENCOUNTER — Ambulatory Visit
Admission: RE | Admit: 2020-06-14 | Discharge: 2020-06-14 | Disposition: A | Discharge status: Home / Self Care | Payer: Medicare Other | Source: Ambulatory Visit | Attending: Family Medicine | Admitting: Family Medicine

## 2020-06-14 ENCOUNTER — Other Ambulatory Visit: Payer: Self-pay

## 2020-06-14 DIAGNOSIS — Z1231 Encounter for screening mammogram for malignant neoplasm of breast: Secondary | ICD-10-CM | POA: Diagnosis not present

## 2020-06-28 ENCOUNTER — Ambulatory Visit (INDEPENDENT_AMBULATORY_CARE_PROVIDER_SITE_OTHER): Payer: Medicare Other

## 2020-06-28 ENCOUNTER — Other Ambulatory Visit: Payer: Self-pay

## 2020-06-28 DIAGNOSIS — Z0000 Encounter for general adult medical examination without abnormal findings: Secondary | ICD-10-CM

## 2020-06-28 DIAGNOSIS — Z780 Asymptomatic menopausal state: Secondary | ICD-10-CM

## 2020-06-28 NOTE — Progress Notes (Addendum)
Subjective:   [name] is a 75 y.o. female who presents for Medicare Annual (Subsequent) preventive examination.  I connected with [name] today by telephone and verified that I am speaking with the correct person using two identifiers. Location patient: home Location provider: work Persons participating in the virtual visit: patient, provider.   I discussed the limitations, risks, security and privacy concerns of performing an evaluation and management service by telephone and the availability of in person appointments. I also discussed with the patient that there may be a patient responsible charge related to this service. The patient expressed understanding and verbally consented to this telephonic visit.    Interactive audio and video telecommunications were attempted between this provider and patient, however failed, due to patient having technical difficulties OR patient did not have access to video capability.  We continued and completed visit with audio only.      Review of Systems    N/A  Cardiac Risk Factors include: advanced age (>56men, >30 women)     Objective:    Today's Vitals   There is no height or weight on file to calculate BMI.  Advanced Directives 06/28/2020 02/04/2020 06/05/2019  Does Patient Have a Medical Advance Directive? Yes Yes Yes  Type of Paramedic of [place];Living will [place];Living will [place];Living will  Does patient want to make changes to medical advance directive? No - Patient declined No - Patient declined No - Patient declined  Copy of [place] in Chart? No - copy requested No - copy requested No - copy requested    Current Medications (verified) Outpatient Encounter Medications as of 06/28/2020  Medication Sig  . chlorpheniramine (CHLOR-TRIMETON) 4 MG tablet Take 4 mg by mouth 2 (two) times daily as needed for allergies.  Marland Kitchen  FLUoxetine (PROZAC) 20 MG capsule TAKE 1 CAPSULE BY MOUTH EVERY DAY  . Multiple Vitamin (MULTIVITAMIN) capsule Take 1 capsule by mouth daily.  . clotrimazole-betamethasone (LOTRISONE) cream Apply topically 2 (two) times daily.  Marland Kitchen estradiol (ESTRACE) 0.1 MG/GM vaginal cream INSERT 1 APPLICATORFUL VAGINALLY 3 TIMES A WEEK (Patient not taking: Reported on 06/28/2020)  . [DISCONTINUED] HYDROcodone-acetaminophen (NORCO) 5-325 MG tablet Take 1 tablet by mouth every 4 (four) hours as needed for moderate pain.  . [DISCONTINUED] nystatin cream (MYCOSTATIN) Apply 1 application topically 2 (two) times daily. (Patient not taking: Reported on 06/28/2020)   No facility-administered encounter medications on file as of 06/28/2020.    Allergies (verified) Aspirin   History: Past Medical History:  Diagnosis Date  . Anxiety    aniety attacks  . Bladder tumor   . Bronchitis   . Colon polyps   . Depression   . Hay fever    allergies  . History of kidney stones   . Migraines    not in 40 years  . Rash    buttock x 1 year nystatin prn   Past Surgical History:  Procedure Laterality Date  . BREAST BIOPSY  1990  . caesarean secion  1974, 1977, 1984  . CESAREAN SECTION  [phone]  . COLONOSCOPY W/ POLYPECTOMY  last done 2016    x 3   . CYSTOSCOPY W/ RETROGRADES Bilateral 02/04/2020   Procedure: CYSTOSCOPY WITH RETROGRADE PYELOGRAM;  Surgeon: Lucas Mallow, MD;  Location: [facility];  Service: Urology;  Laterality: Bilateral;  . OPEN REDUCTION INTERNAL FIXATION (ORIF) DISTAL RADIAL FRACTURE Right 11/03/2014   Procedure: OPEN REDUCTION INTERNAL  FIXATION (ORIF) RIGHT DISTAL RADIAL FRACTURE WITH REPAIR AND RECONSTRUCTION;  Surgeon: Roseanne Kaufman, MD;  Location: [facility];  Service: Orthopedics;  Laterality: Right;  . TRANSURETHRAL RESECTION OF BLADDER TUMOR WITH MITOMYCIN-C N/A 02/04/2020   Procedure: TRANSURETHRAL RESECTION OF BLADDER TUMOR WITH POST OPERATIVE GEMCITABINE;  Surgeon:  Lucas Mallow, MD;  Location: [facility];  Service: Urology;  Laterality: N/A;   Family History  Problem Relation Age of Onset  . Arthritis Other   . Stroke Other   . Hypertension Other   . Depression Other   . Migraines Other   . Colon polyps Other   . Breast cancer Neg Hx    Social History   Socioeconomic History  . Marital status: Married    Spouse name: Not on file  . Number of children: 3  . Years of education: 50  . Highest education level: Some college, no degree  Occupational History    Comment: Retired   Tobacco Use  . Smoking status: Former Smoker    Packs/day: 1.50    Years: 50.00    Pack years: 75.00    Types: Cigarettes    Quit date: 2012    Years since quitting: 10.0  . Smokeless tobacco: Never Used  . Tobacco comment: Former smoker  Media planner  . Vaping Use: Never used  Substance and Sexual Activity  . Alcohol use: Yes    Alcohol/week: 2.0 standard drinks    Types: 2 Glasses of wine per week    Comment: 2 glasses of wine daily  . Drug use: No  . Sexual activity: Not on file  Other Topics Concern  . Not on file  Social History Narrative   Married   3 children   Social Determinants of Health   Financial Resource Strain: Low Risk   . Difficulty of Paying Living Expenses: Not hard at all  Food Insecurity: No Food Insecurity  . Worried About Charity fundraiser in the Last Year: Never true  . Ran Out of Food in the Last Year: Never true  Transportation Needs: No Transportation Needs  . Lack of Transportation (Medical): No  . Lack of Transportation (Non-Medical): No  Physical Activity: Insufficiently Active  . Days of Exercise per Week: 3 days  . Minutes of Exercise per Session: 40 min  Stress: No Stress Concern Present  . Feeling of Stress : Not at all  Social Connections: Moderately Isolated  . Frequency of Communication with Friends and Family: More than three times a week  . Frequency of Social Gatherings with Friends  and Family: More than three times a week  . Attends Religious Services: Never  . Active Member of Clubs or Organizations: No  . Attends Archivist Meetings: Never  . Marital Status: Married    Tobacco Counseling Counseling given: Not Answered Comment: Former smoker   Clinical Intake:  Pre-visit preparation completed: Yes  Pain : No/denies pain     Nutritional Risks: None Diabetes: No  How often do you need to have someone help you when you read instructions, pamphlets, or other written materials from your doctor or pharmacy?: 1 - Never What is the last grade level you completed in school?: Some College  Diabetic?No  Interpreter Needed?: No  Information entered by :: [place] of Daily Living In your present state of health, do you have any difficulty performing the following activities: 06/28/2020 02/04/2020  Hearing? N N  Vision? N N  Difficulty concentrating  or making decisions? - N  Walking or climbing stairs? N N  Dressing or bathing? N N  Doing errands, shopping? N -  Preparing Food and eating ? N -  Using the Toilet? N -  In the past six months, have you accidently leaked urine? Y -  Comment only during bcg treatment -  Do you have problems with loss of bowel control? N -  Managing your Medications? N -  Managing your Finances? N -  Housekeeping or managing your Housekeeping? N -  Some recent data might be hidden    Patient Care Team: Eulas Post, MD as PCP - General  Indicate any recent Medical Services you may have received from other than [facility] providers in the past year (date may be approximate).     Assessment:   This is a routine wellness examination for Nguyen.  Hearing/Vision screen  Hearing Screening   125Hz  250Hz  500Hz  1000Hz  2000Hz  3000Hz  4000Hz  6000Hz  8000Hz   Right ear:           Left ear:           Vision Screening Comments: Patient stated she gets her eye examined once per year   Dietary issues and  exercise activities discussed: Current Exercise Habits: Home exercise routine, Type of exercise: walking, Time (Minutes): 40, Intensity: Mild  Goals    . Patient Stated     I will continue to walk 3 days per week for 45-50 minutes      Depression Screen PHQ 2/9 Scores 06/28/2020 06/05/2019 01/22/2018 09/02/2014  PHQ - 2 Score 0 1 0 0  PHQ- 9 Score 0 - - -    Fall Risk Fall Risk  06/28/2020 06/05/2019 05/12/2019 01/22/2018 01/17/2018  Falls in the past year? 0 0 0 No No  Comment - - Emmi Telephone Survey: data to providers prior to load - Emmi Telephone Survey: data to providers prior to load  Number falls in past yr: 0 - - - -  Injury with Fall? 0 - - - -  Risk for fall due to : No Fall Risks - - - -  Follow up Falls evaluation completed;Falls prevention discussed - - - -    FALL RISK PREVENTION PERTAINING TO THE HOME:  Any stairs in or around the home? Yes  If so, are there any without handrails? No  Home free of loose throw rugs in walkways, pet beds, electrical cords, etc? Yes  Adequate lighting in your home to reduce risk of falls? Yes   ASSISTIVE DEVICES UTILIZED TO PREVENT FALLS:  Life alert? No  Use of a cane, walker or w/c? No  Grab bars in the bathroom? No  Shower chair or bench in shower? No  Elevated toilet seat or a handicapped toilet? No     Cognitive Function: Normal cognitive status assessed by direct observation by this Nurse Health Advisor. No abnormalities found.       6CIT Screen 06/05/2019  What Year? 0 points  What month? 0 points  What time? 0 points  Count back from 20 0 points  Months in reverse 0 points  Repeat phrase 0 points  Total Score 0    Immunizations Immunization History  Administered Date(s) Administered  . Hepatitis A, Adult 05/03/2015  . Influenza Split 03/14/2011, 04/29/2012, 02/20/2013  . Influenza Whole 03/19/2010  . Influenza, High Dose Seasonal PF 03/26/2014, 03/11/2017, 02/04/2018, 02/11/2019  . Influenza-Unspecified  03/16/2015, 03/06/2016, 03/11/2017, 02/06/2020  . PFIZER SARS-COV-2 Vaccination 07/14/2019, 08/04/2019  . Pneumococcal  Conjugate-13 01/02/2017, 09/01/2019  . Pneumococcal Polysaccharide-23 11/03/2009  . Td 06/19/2006, 08/27/2007  . Tdap 06/06/2019  . Zoster Recombinat (Shingrix) 03/11/2017, 06/30/2017    TDAP status: Up to date  Flu Vaccine status: Up to date  Pneumococcal vaccine status: Up to date  Covid-19 vaccine status: Completed vaccines  Qualifies for Shingles Vaccine? Yes   Zostavax completed No   Shingrix Completed?: Yes  Screening Tests Health Maintenance  Topic Date Due  . COVID-19 Vaccine (3 - Booster for Pfizer series) 02/01/2020  . PNA vac Low Risk Adult (2 of 2 - PPSV23) 08/31/2020  . COLONOSCOPY (Pts 45-78yrs Insurance coverage will need to be confirmed)  07/06/2027  . TETANUS/TDAP  06/05/2029  . INFLUENZA VACCINE  Completed  . DEXA SCAN  Completed  . Hepatitis C Screening  Completed    Health Maintenance  Health Maintenance Due  Topic Date Due  . COVID-19 Vaccine (3 - Booster for Pfizer series) 02/01/2020    Colorectal cancer screening: Type of screening: Colonoscopy. Completed 07/05/2017. Repeat every 5 years  Mammogram status: Completed 06/14/2020. Repeat every year  Bone Density status: Ordered 06/28/2020. Pt provided with contact info and advised to call to schedule appt.  Lung Cancer Screening: (Low Dose CT Chest recommended if Age 49-80 years, 30 pack-year currently smoking OR have quit w/in 15years.) does not qualify.   Lung Cancer Screening Referral: N/A   Additional Screening:  Hepatitis C Screening: does qualify; Completed 01/02/2017  Vision Screening: Recommended annual ophthalmology exams for early detection of glaucoma and other disorders of the eye. Is the patient up to date with their annual eye exam?  Yes  Who is the provider or what is the name of the office in which the patient attends annual eye exams? Dr. Delman Cheadle  If pt is  not established with a provider, would they like to be referred to a provider to establish care? No .   Dental Screening: Recommended annual dental exams for proper oral hygiene  Community Resource Referral / Chronic Care Management: CRR required this visit?  No   CCM required this visit?  No      Plan:     I have personally reviewed and noted the following in the patient's chart:   . Medical and social history . Use of alcohol, tobacco or illicit drugs  . Current medications and supplements . Functional ability and status . Nutritional status . Physical activity . Advanced directives . List of other physicians . Hospitalizations, surgeries, and ER visits in previous 12 months . Vitals . Screenings to include cognitive, depression, and falls . Referrals and appointments  In addition, I have reviewed and discussed with patient certain preventive protocols, quality metrics, and best practice recommendations. A written personalized care plan for preventive services as well as general preventive health recommendations were provided to patient.     Ofilia Neas, LPN   [id number]   Nurse Notes: None

## 2020-06-28 NOTE — Patient Instructions (Signed)
[name] , Thank you for taking time to come for your Medicare Wellness Visit. I appreciate your ongoing commitment to your health goals. Please review the following plan we discussed and let me know if I can assist you in the future.   Screening recommendations/referrals: Colonoscopy: Up to date, next due 07/05/2022 Mammogram: Up to date, next due 06/14/2021 Bone Density: Currently due, orders placed this visit Recommended yearly ophthalmology/optometry visit for glaucoma screening and checkup Recommended yearly dental visit for hygiene and checkup  Vaccinations: Influenza vaccine: Up to date, next due fall 2022  Pneumococcal vaccine: Completed series  Tdap vaccine: Up to date, next due 06/06/2019 Shingles vaccine: Completed series     Advanced directives: Copies on file   Conditions/risks identified: None   Next appointment: None    Preventive Care 76 Years and Older, Female Preventive care refers to lifestyle choices and visits with your health care provider that can promote health and wellness. What does preventive care include?  A yearly physical exam. This is also called an annual well check.  Dental exams once or twice a year.  Routine eye exams. Ask your health care provider how often you should have your eyes checked.  Personal lifestyle choices, including:  Daily care of your teeth and gums.  Regular physical activity.  Eating a healthy diet.  Avoiding tobacco and drug use.  Limiting alcohol use.  Practicing safe sex.  Taking low-dose aspirin every day.  Taking vitamin and mineral supplements as recommended by your health care provider. What happens during an annual well check? The services and screenings done by your health care provider during your annual well check will depend on your age, overall health, lifestyle risk factors, and family history of disease. Counseling  Your health care provider may ask you questions about your:  Alcohol  use.  Tobacco use.  Drug use.  Emotional well-being.  Home and relationship well-being.  Sexual activity.  Eating habits.  History of falls.  Memory and ability to understand (cognition).  Work and work Statistician.  Reproductive health. Screening  You may have the following tests or measurements:  Height, weight, and BMI.  Blood pressure.  Lipid and cholesterol levels. These may be checked every 5 years, or more frequently if you are over 63 years old.  Skin check.  Lung cancer screening. You may have this screening every year starting at age 22 if you have a 30-pack-year history of smoking and currently smoke or have quit within the past 15 years.  Fecal occult blood test (FOBT) of the stool. You may have this test every year starting at age 51.  Flexible sigmoidoscopy or colonoscopy. You may have a sigmoidoscopy every 5 years or a colonoscopy every 10 years starting at age 15.  Hepatitis C blood test.  Hepatitis B blood test.  Sexually transmitted disease (STD) testing.  Diabetes screening. This is done by checking your blood sugar (glucose) after you have not eaten for a while (fasting). You may have this done every 1-3 years.  Bone density scan. This is done to screen for osteoporosis. You may have this done starting at age 13.  Mammogram. This may be done every 1-2 years. Talk to your health care provider about how often you should have regular mammograms. Talk with your health care provider about your test results, treatment options, and if necessary, the need for more tests. Vaccines  Your health care provider may recommend certain vaccines, such as:  Influenza vaccine. This is recommended every  year.  Tetanus, diphtheria, and acellular pertussis (Tdap, Td) vaccine. You may need a Td booster every 10 years.  Zoster vaccine. You may need this after age 61.  Pneumococcal 13-valent conjugate (PCV13) vaccine. One dose is recommended after age  11.  Pneumococcal polysaccharide (PPSV23) vaccine. One dose is recommended after age 51. Talk to your health care provider about which screenings and vaccines you need and how often you need them. This information is not intended to replace advice given to you by your health care provider. Make sure you discuss any questions you have with your health care provider. Document Released: 07/02/2015 Document Revised: 02/23/2016 Document Reviewed: 04/06/2015 Elsevier Interactive Patient Education  2017 [place] Prevention in the Home Falls can cause injuries. They can happen to people of all ages. There are many things you can do to make your home safe and to help prevent falls. What can I do on the outside of my home?  Regularly fix the edges of walkways and driveways and fix any cracks.  Remove anything that might make you trip as you walk through a door, such as a raised step or threshold.  Trim any bushes or trees on the path to your home.  Use bright outdoor lighting.  Clear any walking paths of anything that might make someone trip, such as rocks or tools.  Regularly check to see if handrails are loose or broken. Make sure that both sides of any steps have handrails.  Any raised decks and porches should have guardrails on the edges.  Have any leaves, snow, or ice cleared regularly.  Use sand or salt on walking paths during winter.  Clean up any spills in your garage right away. This includes oil or grease spills. What can I do in the bathroom?  Use night lights.  Install grab bars by the toilet and in the tub and shower. Do not use towel bars as grab bars.  Use non-skid mats or decals in the tub or shower.  If you need to sit down in the shower, use a plastic, non-slip stool.  Keep the floor dry. Clean up any water that spills on the floor as soon as it happens.  Remove soap buildup in the tub or shower regularly.  Attach bath mats securely with double-sided  non-slip rug tape.  Do not have throw rugs and other things on the floor that can make you trip. What can I do in the bedroom?  Use night lights.  Make sure that you have a light by your bed that is easy to reach.  Do not use any sheets or blankets that are too big for your bed. They should not hang down onto the floor.  Have a firm chair that has side arms. You can use this for support while you get dressed.  Do not have throw rugs and other things on the floor that can make you trip. What can I do in the kitchen?  Clean up any spills right away.  Avoid walking on wet floors.  Keep items that you use a lot in easy-to-reach places.  If you need to reach something above you, use a strong step stool that has a grab bar.  Keep electrical cords out of the way.  Do not use floor polish or wax that makes floors slippery. If you must use wax, use non-skid floor wax.  Do not have throw rugs and other things on the floor that can make you trip. What can  I do with my stairs?  Do not leave any items on the stairs.  Make sure that there are handrails on both sides of the stairs and use them. Fix handrails that are broken or loose. Make sure that handrails are as long as the stairways.  Check any carpeting to make sure that it is firmly attached to the stairs. Fix any carpet that is loose or worn.  Avoid having throw rugs at the top or bottom of the stairs. If you do have throw rugs, attach them to the floor with carpet tape.  Make sure that you have a light switch at the top of the stairs and the bottom of the stairs. If you do not have them, ask someone to add them for you. What else can I do to help prevent falls?  Wear shoes that:  Do not have high heels.  Have rubber bottoms.  Are comfortable and fit you well.  Are closed at the toe. Do not wear sandals.  If you use a stepladder:  Make sure that it is fully opened. Do not climb a closed stepladder.  Make sure that both  sides of the stepladder are locked into place.  Ask someone to hold it for you, if possible.  Clearly mark and make sure that you can see:  Any grab bars or handrails.  First and last steps.  Where the edge of each step is.  Use tools that help you move around (mobility aids) if they are needed. These include:  Canes.  Walkers.  Scooters.  Crutches.  Turn on the lights when you go into a dark area. Replace any light bulbs as soon as they burn out.  Set up your furniture so you have a clear path. Avoid moving your furniture around.  If any of your floors are uneven, fix them.  If there are any pets around you, be aware of where they are.  Review your medicines with your doctor. Some medicines can make you feel dizzy. This can increase your chance of falling. Ask your doctor what other things that you can do to help prevent falls. This information is not intended to replace advice given to you by your health care provider. Make sure you discuss any questions you have with your health care provider. Document Released: 04/01/2009 Document Revised: 11/11/2015 Document Reviewed: 07/10/2014 Elsevier Interactive Patient Education  2017 [employer].

## 2020-08-20 ENCOUNTER — Other Ambulatory Visit: Payer: Self-pay

## 2020-08-20 ENCOUNTER — Ambulatory Visit (INDEPENDENT_AMBULATORY_CARE_PROVIDER_SITE_OTHER): Payer: Medicare Other | Admitting: Family Medicine

## 2020-08-20 ENCOUNTER — Encounter: Payer: Self-pay | Admitting: Family Medicine

## 2020-08-20 VITALS — BP 110/64 | HR 72 | Temp 98.6°F | Wt 173.4 lb

## 2020-08-20 DIAGNOSIS — M67442 Ganglion, left hand: Secondary | ICD-10-CM | POA: Diagnosis not present

## 2020-08-20 NOTE — Progress Notes (Signed)
   Subjective:    Patient ID: [name], female    DOB: 1945/08/27, 75 y.o.   MRN: [id number]  HPI Here for 2 weeks of a tender lump on the left third finger. No recent trauma.    Review of Systems  Constitutional: Negative.   Respiratory: Negative.   Cardiovascular: Negative.        Objective:   Physical Exam Cardiovascular:     Rate and Rhythm: Normal rate and regular rhythm.     Pulses: Normal pulses.     Heart sounds: Normal heart sounds.  Pulmonary:     Effort: Pulmonary effort is normal.     Breath sounds: Normal breath sounds.  Skin:    Comments: There is a tender cyst over the extensor surface of the left third finger over the nail bed. This has caused a groove to form along the nail   Neurological:     Mental Status: She is alert.           Assessment & Plan:  Ganglion cyst. Refer to Hand Surgery to remove this.  Alysia Penna, MD

## 2020-08-25 ENCOUNTER — Telehealth: Payer: Self-pay | Admitting: Family Medicine

## 2020-08-25 NOTE — Telephone Encounter (Signed)
Pt is calling in needing a refill on Rx fluoxetine (PROZAC) 20 MG.   Pharm:  [facility] in [place]

## 2020-08-25 NOTE — Telephone Encounter (Signed)
Called and spoke w/ pt  regarding  refill she as hasn't this fill this in long time  since 2020, she said that she was getting a 3 months supply , she said that she has recurrently follow up  Dr. Sarajane Jews. I told her that I will have to put this up for provider to review. Please advise

## 2020-08-26 ENCOUNTER — Other Ambulatory Visit: Payer: Self-pay

## 2020-08-26 MED ORDER — FLUOXETINE HCL 20 MG PO CAPS: ORAL_CAPSULE | ORAL | 0 refills | Status: DC

## 2020-08-26 NOTE — Telephone Encounter (Signed)
Refill for one month and set up office follow up.

## 2020-08-26 NOTE — Telephone Encounter (Signed)
Appointment scheduled for 08-30-2020.  Thirty day supply of fluoxetine 20 mg sent to the pharmacy.

## 2020-08-30 ENCOUNTER — Other Ambulatory Visit: Payer: Self-pay

## 2020-08-30 ENCOUNTER — Ambulatory Visit (INDEPENDENT_AMBULATORY_CARE_PROVIDER_SITE_OTHER): Payer: Medicare Other | Admitting: Family Medicine

## 2020-08-30 ENCOUNTER — Encounter: Payer: Self-pay | Admitting: Family Medicine

## 2020-08-30 VITALS — BP 104/70 | HR 85 | Temp 98.1°F | Ht 67.0 in | Wt 174.4 lb

## 2020-08-30 DIAGNOSIS — R2230 Localized swelling, mass and lump, unspecified upper limb: Secondary | ICD-10-CM | POA: Diagnosis not present

## 2020-08-30 DIAGNOSIS — C679 Malignant neoplasm of bladder, unspecified: Secondary | ICD-10-CM

## 2020-08-30 DIAGNOSIS — R635 Abnormal weight gain: Secondary | ICD-10-CM

## 2020-08-30 DIAGNOSIS — F339 Major depressive disorder, recurrent, unspecified: Secondary | ICD-10-CM | POA: Diagnosis not present

## 2020-08-30 DIAGNOSIS — M67442 Ganglion, left hand: Secondary | ICD-10-CM | POA: Diagnosis not present

## 2020-08-30 DIAGNOSIS — M79642 Pain in left hand: Secondary | ICD-10-CM | POA: Insufficient documentation

## 2020-08-30 DIAGNOSIS — C678 Malignant neoplasm of overlapping sites of bladder: Secondary | ICD-10-CM | POA: Diagnosis not present

## 2020-08-30 LAB — BASIC METABOLIC PANEL
BUN: 22 mg/dL (ref 6–23)
CO2: 29 mEq/L (ref 19–32)
Calcium: 9.4 mg/dL (ref 8.4–10.5)
Chloride: 105 mEq/L (ref 96–112)
Creatinine, Ser: 0.99 mg/dL (ref 0.40–1.20)
GFR: 55.74 mL/min — ABNORMAL LOW (ref >60.00)
Glucose, Bld: 94 mg/dL (ref 70–99)
Potassium: 3.7 mEq/L (ref 3.5–5.1)
Sodium: 141 mEq/L (ref 135–145)

## 2020-08-30 LAB — TSH: TSH: 0.89 u[IU]/mL (ref 0.35–4.50)

## 2020-08-30 MED ORDER — FLUOXETINE HCL 20 MG PO CAPS: ORAL_CAPSULE | ORAL | 3 refills | Status: DC

## 2020-08-30 NOTE — Progress Notes (Signed)
Established Patient Office Visit  Subjective:  Patient ID: [name], female    DOB: 1946/01/22  Age: 75 y.o. MRN: [id number]  CC:  Chief Complaint  Patient presents with  . Medication Refill    HPI [name] presents for medication refill and medical follow-up.  She has history of recurrent depression and has been on Prozac 20 mg daily for several years.  She is reluctant to come off and needs refills.  She feels her depression symptoms are relatively stable.  She developed some gross hematuria last summer.  She was referred to urology and diagnosed with bladder cancer.  She had recent cystoscopy with evidence for recurrence.  She has been treated with BCG instillation into her bladder.  She does have past history of kidney stones as well but none recently.  She quit smoking several years ago.  She is concerned about some recent weight gain.  Denies any constipation.  No hair loss symptoms.  She states her diet has not changed.  Does stay fairly active.  She notices her distribution of weight is mostly around the middle.  Appetite is good. Past Medical History:  Diagnosis Date  . Anxiety    aniety attacks  . Bladder tumor   . Bronchitis   . Colon polyps   . Depression   . Hay fever    allergies  . History of kidney stones   . Migraines    not in 40 years  . Rash    buttock x 1 year nystatin prn    Past Surgical History:  Procedure Laterality Date  . BREAST BIOPSY  1990  . caesarean secion  1974, 1977, 1984  . CESAREAN SECTION  [phone]  . COLONOSCOPY W/ POLYPECTOMY  last done 2016    x 3   . CYSTOSCOPY W/ RETROGRADES Bilateral 02/04/2020   Procedure: CYSTOSCOPY WITH RETROGRADE PYELOGRAM;  Surgeon: Lucas Mallow, MD;  Location: [facility];  Service: Urology;  Laterality: Bilateral;  . OPEN REDUCTION INTERNAL FIXATION (ORIF) DISTAL RADIAL FRACTURE Right 11/03/2014   Procedure: OPEN REDUCTION INTERNAL FIXATION (ORIF) RIGHT DISTAL  RADIAL FRACTURE WITH REPAIR AND RECONSTRUCTION;  Surgeon: Roseanne Kaufman, MD;  Location: [facility];  Service: Orthopedics;  Laterality: Right;  . TRANSURETHRAL RESECTION OF BLADDER TUMOR WITH MITOMYCIN-C N/A 02/04/2020   Procedure: TRANSURETHRAL RESECTION OF BLADDER TUMOR WITH POST OPERATIVE GEMCITABINE;  Surgeon: Lucas Mallow, MD;  Location: [facility];  Service: Urology;  Laterality: N/A;    Family History  Problem Relation Age of Onset  . Arthritis Other   . Stroke Other   . Hypertension Other   . Depression Other   . Migraines Other   . Colon polyps Other   . Breast cancer Neg Hx     Social History   Socioeconomic History  . Marital status: Married    Spouse name: Not on file  . Number of children: 3  . Years of education: 31  . Highest education level: Some college, no degree  Occupational History    Comment: Retired   Tobacco Use  . Smoking status: Former Smoker    Packs/day: 1.50    Years: 50.00    Pack years: 75.00    Types: Cigarettes    Quit date: 2012    Years since quitting: 10.2  . Smokeless tobacco: Never Used  . Tobacco comment: Former smoker  Media planner  . Vaping Use: Never used  Substance and Sexual Activity  .  Alcohol use: Yes    Alcohol/week: 2.0 standard drinks    Types: 2 Glasses of wine per week    Comment: 2 glasses of wine daily  . Drug use: No  . Sexual activity: Not on file  Other Topics Concern  . Not on file  Social History Narrative   Married   3 children   Social Determinants of Health   Financial Resource Strain: Low Risk   . Difficulty of Paying Living Expenses: Not hard at all  Food Insecurity: No Food Insecurity  . Worried About Charity fundraiser in the Last Year: Never true  . Ran Out of Food in the Last Year: Never true  Transportation Needs: No Transportation Needs  . Lack of Transportation (Medical): No  . Lack of Transportation (Non-Medical): No  Physical Activity: Insufficiently Active  . Days of  Exercise per Week: 3 days  . Minutes of Exercise per Session: 40 min  Stress: No Stress Concern Present  . Feeling of Stress : Not at all  Social Connections: Moderately Isolated  . Frequency of Communication with Friends and Family: More than three times a week  . Frequency of Social Gatherings with Friends and Family: More than three times a week  . Attends Religious Services: Never  . Active Member of Clubs or Organizations: No  . Attends Archivist Meetings: Never  . Marital Status: Married  Human resources officer Violence: Not At Risk  . Fear of Current or Ex-Partner: No  . Emotionally Abused: No  . Physically Abused: No  . Sexually Abused: No    Outpatient Medications Prior to Visit  Medication Sig Dispense Refill  . chlorpheniramine (CHLOR-TRIMETON) 4 MG tablet Take 4 mg by mouth 2 (two) times daily as needed for allergies.    . Multiple Vitamin (MULTIVITAMIN) capsule Take 1 capsule by mouth daily.    Marland Kitchen FLUoxetine (PROZAC) 20 MG capsule TAKE 1 CAPSULE BY MOUTH EVERY DAY 30 capsule 0  . clotrimazole-betamethasone (LOTRISONE) cream Apply topically 2 (two) times daily.    Marland Kitchen estradiol (ESTRACE) 0.1 MG/GM vaginal cream INSERT 1 APPLICATORFUL VAGINALLY 3 TIMES A WEEK (Patient not taking: No sig reported) 42.5 g 5   No facility-administered medications prior to visit.    Allergies  Allergen Reactions  . Aspirin     REACTION: vasculitis    ROS Review of Systems  Constitutional: Positive for unexpected weight change. Negative for chills and fever.  Respiratory: Negative for cough and shortness of breath.   Cardiovascular: Negative for chest pain.  Gastrointestinal: Negative for abdominal pain.  Endocrine: Negative for cold intolerance.      Objective:    Physical Exam Vitals reviewed.  Constitutional:      Appearance: Normal appearance.  Cardiovascular:     Rate and Rhythm: Normal rate and regular rhythm.  Pulmonary:     Effort: Pulmonary effort is normal.      Breath sounds: Normal breath sounds.  Musculoskeletal:     Right lower leg: No edema.     Left lower leg: No edema.  Neurological:     Mental Status: She is alert.     BP 104/70 (BP Location: Left Arm, Patient Position: Sitting, Cuff Size: Large)   Pulse 85   Temp 98.1 F (36.7 C) (Oral)   Ht 5\' 7"  (1.702 m)   Wt 174 lb 6.4 oz (79.1 kg)   BMI 27.31 kg/m  Wt Readings from Last 3 Encounters:  08/30/20 174 lb 6.4 oz (79.1 kg)  08/20/20 173 lb 6.4 oz (78.7 kg)  02/04/20 167 lb 1.6 oz (75.8 kg)     There are no preventive care reminders to display for this patient.  There are no preventive care reminders to display for this patient.  Lab Results  Component Value Date   TSH 0.89 08/30/2020   Lab Results  Component Value Date   WBC 5.2 11/03/2014   HGB 13.4 11/03/2014   HCT 41.0 11/03/2014   MCV 94.3 11/03/2014   PLT 268 11/03/2014   Lab Results  Component Value Date   NA 141 08/30/2020   K 3.7 08/30/2020   CO2 29 08/30/2020   GLUCOSE 94 08/30/2020   BUN 22 08/30/2020   CREATININE 0.99 08/30/2020   BILITOT 0.8 06/06/2012   ALKPHOS 62 06/06/2012   AST 18 06/06/2012   ALT 16 06/06/2012   PROT 7.0 06/06/2012   ALBUMIN 4.0 06/06/2012   CALCIUM 9.4 08/30/2020   GFR 55.74 (L) 08/30/2020   Lab Results  Component Value Date   CHOL 218 (H) 06/06/2012   Lab Results  Component Value Date   HDL 70.30 06/06/2012   Lab Results  Component Value Date   LDLCALC 91 05/04/2010   Lab Results  Component Value Date   TRIG 64.0 06/06/2012   Lab Results  Component Value Date   CHOLHDL 3 06/06/2012   No results found for: HGBA1C    Assessment & Plan:   #1 recurrent depression stable on Prozac 20 mg daily -Refill Prozac for 1 year  #2 weight gain.  This has been gradual over the past year.  No significant edema. -Check TSH -Continue restriction of high glycemic foods and regular exercise habits  #3 bladder cancer which was diagnosed during the past  year. -Continue close follow-up with urology  Meds ordered this encounter  Medications  . FLUoxetine (PROZAC) 20 MG capsule    Sig: TAKE 1 CAPSULE BY MOUTH EVERY DAY    Dispense:  90 capsule    Refill:  3    Follow-up: No follow-ups on file.    Carolann Littler, MD

## 2020-08-30 NOTE — Patient Instructions (Signed)
Preventing Unhealthy Weight Gain, Adult Staying at a healthy weight is important to your overall health. When fat builds up in your body, you may become overweight or obese. Being overweight or obese increases your risk of developing certain health problems, such as heart disease, diabetes, sleeping problems, joint problems, and some types of cancer. Unhealthy weight gain is often the result of making unhealthy food choices or not getting enough exercise. You can make changes to your lifestyle to prevent obesity and stay as healthy as possible. What nutrition changes can be made?  Eat only as much as your body needs. To do this: ? Pay attention to signs that you are hungry or full. Stop eating as soon as you feel full. ? If you feel hungry, try drinking water first before eating. Drink enough water so your urine is clear or pale yellow. ? Eat smaller portions. Pay attention to portion sizes when eating out. ? Look at serving sizes on food labels. Most foods contain more than one serving per container. ? Eat the recommended number of calories for your gender and activity level. For most active people, a daily total of 2,000 calories is appropriate. If you are trying to lose weight or are not very active, you may need to eat fewer calories. Talk with your health care provider or a diet and nutrition specialist (dietitian) about how many calories you need each day.  Choose healthy foods, such as: ? Fruits and vegetables. At each meal, try to fill at least half of your plate with fruits and vegetables. ? Whole grains, such as whole-wheat bread, [place]quejo rice, and quinoa. ? Lean meats, such as chicken or fish. ? Other healthy proteins, such as beans, eggs, or tofu. ? Healthy fats, such as nuts, seeds, fatty fish, and olive oil. ? Low-fat or fat-free dairy products.  Check food labels, and avoid food and drinks that: ? Are high in calories. ? Have added sugar. ? Are high in sodium. ? Have saturated  fats or trans fats.  Cook foods in healthier ways, such as by baking, broiling, or grilling.  Make a meal plan for the week, and shop with a grocery list to help you stay on track with your purchases. Try to avoid going to the grocery store when you are hungry.  When grocery shopping, try to shop around the outside of the store first, where the fresh foods are. Doing this helps you to avoid prepackaged foods, which can be high in sugar, salt (sodium), and fat.   What lifestyle changes can be made?  Exercise for 30 or more minutes on 5 or more days each week. Exercising may include brisk walking, yard work, biking, running, swimming, and team sports like basketball and soccer. Ask your health care provider which exercises are safe for you.  Do muscle-strengthening activities, such as lifting weights or using resistance bands, on 2 or more days a week.  Do not use any products that contain nicotine or tobacco, such as cigarettes and e-cigarettes. If you need help quitting, ask your health care provider.  Limit alcohol intake to no more than 1 drink a day for nonpregnant women and 2 drinks a day for men. One drink equals 12 oz of beer, 5 oz of wine, or 1 oz of hard liquor.  Try to get 7-9 hours of sleep each night.   What other changes can be made?  Keep a food and activity journal to keep track of: ? What you ate and how  many calories you had. Remember to count the calories in sauces, dressings, and side dishes. ? Whether you were active, and what exercises you did. ? Your calorie, weight, and activity goals.  Check your weight regularly. Track any changes. If you notice you have gained weight, make changes to your diet or activity routine.  Avoid taking weight-loss medicines or supplements. Talk to your health care provider before starting any new medicine or supplement.  Talk to your health care provider before trying any new diet or exercise plan. Why are these changes  important? Eating healthy, staying active, and having healthy habits can help you to prevent obesity. Those changes also:  Help you manage stress and emotions.  Help you connect with friends and family.  Improve your self-esteem.  Improve your sleep.  Prevent long-term health problems. What can happen if changes are not made? Being obese or overweight can cause you to develop joint or bone problems, which can make it hard for you to stay active or do activities you enjoy. Being obese or overweight also puts stress on your heart and lungs and can lead to health problems like diabetes, heart disease, and some cancers. Where to find more information Talk with your health care provider or a dietitian about healthy eating and healthy lifestyle choices. You may also find information from:  U.S. Department of Agriculture, MyPlate: FormerBoss.no  American Heart Association: www.heart.org  Centers for Disease Control and Prevention: http://www.wolf.info/ Summary  Staying at a healthy weight is important to your overall health. It helps you to prevent certain diseases and health problems, such as heart disease, diabetes, joint problems, sleep disorders, and some types of cancer.  Being obese or overweight can cause you to develop joint or bone problems, which can make it hard for you to stay active or do activities you enjoy.  You can prevent unhealthy weight gain by eating a healthy diet, exercising regularly, not smoking, limiting alcohol, and getting enough sleep.  Talk with your health care provider or a dietitian for guidance about healthy eating and healthy lifestyle choices. This information is not intended to replace advice given to you by your health care provider. Make sure you discuss any questions you have with your health care provider. Document Revised: 10/02/2019 Document Reviewed: 10/02/2019 Elsevier Patient Education  2021 [employer].

## 2020-09-02 ENCOUNTER — Telehealth (INDEPENDENT_AMBULATORY_CARE_PROVIDER_SITE_OTHER): Payer: Medicare Other | Admitting: Family Medicine

## 2020-09-02 ENCOUNTER — Telehealth: Payer: Self-pay | Admitting: Family Medicine

## 2020-09-02 ENCOUNTER — Other Ambulatory Visit: Payer: Self-pay | Admitting: Urology

## 2020-09-02 DIAGNOSIS — R059 Cough, unspecified: Secondary | ICD-10-CM | POA: Diagnosis not present

## 2020-09-02 DIAGNOSIS — J040 Acute laryngitis: Secondary | ICD-10-CM | POA: Diagnosis not present

## 2020-09-02 MED ORDER — BENZONATATE 100 MG PO CAPS: 100.0000 mg | ORAL_CAPSULE | Freq: Three times a day (TID) | ORAL | 0 refills | Status: DC | PRN

## 2020-09-02 NOTE — Patient Instructions (Signed)
  HOME CARE TIPS:  -Panacea testing information: https://www.rivera-powers.org/ OR [phone] Most pharmacies also offer testing and home test kits.  If you get a positive Covid test, please schedule prompt follow-up visit with your primary care office or with [facility].  -I sent the medication(s) we discussed to your pharmacy: Meds ordered this encounter  Medications  . benzonatate (TESSALON PERLES) 100 MG capsule    Sig: Take 1 capsule (100 mg total) by mouth 3 (three) times daily as needed.    Dispense:  20 capsule    Refill:  0    -Take Zyrtec or Allegra once daily (if taking any other antihistamines or decongestants, please hold while taking these medicines.)  -can use tylenol if needed for fevers, aches and pains per instructions  -can use nasal saline a few times per day if you have nasal congestion  -stay hydrated, drink plenty of fluids and eat small healthy meals - avoid dairy  -can take 1000 IU (23mcg) Vit D3 and 100-500 mg of Vit C daily per instructions  -follow up with your doctor in 2-3 days unless improving and feeling better  -stay home while sick, except to seek medical care, and if you have COVID19 ideally it would be best to stay home for a full 10 days since the onset of symptoms PLUS one day of no fever and feeling better.   It was nice to meet you today, and I really hope you are feeling better soon. I help [facility] out with telemedicine visits on Tuesdays and Thursdays and am available for visits on those days. If you have any concerns or questions following this visit please schedule a follow up visit with your Primary Care doctor or seek care at a local urgent care clinic to avoid delays in care.    Seek in person care or schedule a follow up video visit promptly if your symptoms worsen, new concerns arise or you are not improving with treatment. Call 911 and/or seek emergency care if your symptoms are severe or  life threatening.

## 2020-09-02 NOTE — Telephone Encounter (Signed)
Selita w/ [facility] is calling to get surgical clearance for the pt to have a cyst removed from her bladder and Dr.Pace would like to have it before doing surgery.  They will be faxing over a surgical clearance today (09/02/2020).

## 2020-09-02 NOTE — Progress Notes (Signed)
Virtual Visit via Video Note  I connected with [name]  on 09/02/20 at  4:20 PM EDT by a video enabled telemedicine application and verified that I am speaking with the correct person using two identifiers.  Location patient: home, [place] Location provider:work or home office Persons participating in the virtual visit: patient, provider  I discussed the limitations of evaluation and management by telemedicine and the availability of in person appointments. The patient expressed understanding and agreed to proceed.   HPI:  Acute telemedicine visit for : -Onset: 3 days -Symptoms include: cough, laryngitis, PND, decreased appetite -Denies: fevers, body aches, CP, SOB, NVD, inability to eat/drinl/get out of bed -Temp 99.1 -Has tried: warm tea -Pertinent past medical history: has history of seasonal allergies -Pertinent medication allergies:  asa -COVID-19 vaccine status: has had covid vaccines and booster and had flu shot  ROS: See pertinent positives and negatives per HPI.  Past Medical History:  Diagnosis Date  . Anxiety    aniety attacks  . Bladder tumor   . Bronchitis   . Colon polyps   . Depression   . Hay fever    allergies  . History of kidney stones   . Migraines    not in 40 years  . Rash    buttock x 1 year nystatin prn    Past Surgical History:  Procedure Laterality Date  . BREAST BIOPSY  1990  . caesarean secion  1974, 1977, 1984  . CESAREAN SECTION  [phone]  . COLONOSCOPY W/ POLYPECTOMY  last done 2016    x 3   . CYSTOSCOPY W/ RETROGRADES Bilateral 02/04/2020   Procedure: CYSTOSCOPY WITH RETROGRADE PYELOGRAM;  Surgeon: Lucas Mallow, MD;  [facility];  Service: Urology;  Laterality: Bilateral;  . OPEN REDUCTION INTERNAL FIXATION (ORIF) DISTAL RADIAL FRACTURE Right 11/03/2014   Procedure: OPEN REDUCTION INTERNAL FIXATION (ORIF) RIGHT DISTAL RADIAL FRACTURE WITH REPAIR AND RECONSTRUCTION;  Surgeon: Roseanne Kaufman, MD;  Location:  [facility];  Service: Orthopedics;  Laterality: Right;  . TRANSURETHRAL RESECTION OF BLADDER TUMOR WITH MITOMYCIN-C N/A 02/04/2020   Procedure: TRANSURETHRAL RESECTION OF BLADDER TUMOR WITH POST OPERATIVE GEMCITABINE;  Surgeon: Lucas Mallow, MD;  Location: [facility];  Service: Urology;  Laterality: N/A;     Current Outpatient Medications:  .  benzonatate (TESSALON PERLES) 100 MG capsule, Take 1 capsule (100 mg total) by mouth 3 (three) times daily as needed., Disp: 20 capsule, Rfl: 0 .  chlorpheniramine (CHLOR-TRIMETON) 4 MG tablet, Take 4 mg by mouth 2 (two) times daily as needed for allergies., Disp: , Rfl:  .  FLUoxetine (PROZAC) 20 MG capsule, TAKE 1 CAPSULE BY MOUTH EVERY DAY, Disp: 90 capsule, Rfl: 3 .  Multiple Vitamin (MULTIVITAMIN) capsule, Take 1 capsule by mouth daily., Disp: , Rfl:   EXAM:  VITALS per patient if applicable:  GENERAL: alert, oriented, appears well and in no acute distress  HEENT: atraumatic, conjunttiva clear, no obvious abnormalities on inspection of external nose and ears  NECK: normal movements of the head and neck  LUNGS: on inspection no signs of respiratory distress, breathing rate appears normal, no obvious gross SOB, gasping or wheezing  CV: no obvious cyanosis  MS: moves all visible extremities without noticeable abnormality  PSYCH/NEURO: pleasant and cooperative, no obvious depression or anxiety, speech and thought processing grossly intact  ASSESSMENT AND PLAN:  Discussed the following assessment and plan:  Cough  Laryngitis  -we discussed possible serious and likely etiologies, options  for evaluation and workup, limitations of telemedicine visit vs in person visit, treatment, treatment risks and precautions. Pt prefers to treat via telemedicine empirically rather than in person at this moment.  Suspect viral upper respiratory illness most likely, possible seasonal allergies.  Also discussed the possibility of Covid.  She  will do a Covid test at home.  Opted for treatment with Tessalon for cough, nasal saline, and antihistamine and other home care measures summarized in patient instructions. Scheduled follow up with PCP offered: She agrees to schedule follow-up with her primary care office if needed. Advised to seek prompt follow-up video visit or in person care if positive COVID test, worsening, new symptoms arise, or if is not improving with treatment. Discussed options for inperson care if PCP office not available. Did let this patient know that I only do telemedicine on Tuesdays and Thursdays for [place]. Advised to schedule follow up visit with PCP or [facility] if any further questions or concerns to avoid delays in care.   I discussed the assessment and treatment plan with the patient. The patient was provided an opportunity to ask questions and all were answered. The patient agreed with the plan and demonstrated an understanding of the instructions.     Wanda Kern, DO

## 2020-09-03 MED ORDER — GEMCITABINE CHEMO FOR BLADDER INSTILLATION 2000 MG: 2000.0000 mg | Freq: Once | INTRAVENOUS | Status: DC

## 2020-09-03 NOTE — Telephone Encounter (Signed)
In your red folder - you last saw pt 08/30/20

## 2020-09-03 NOTE — Telephone Encounter (Signed)
I will get this completed this weekend

## 2020-09-03 NOTE — Telephone Encounter (Signed)
Noted  

## 2020-09-06 ENCOUNTER — Telehealth: Payer: Self-pay | Admitting: Family Medicine

## 2020-09-06 NOTE — Telephone Encounter (Signed)
Patient is calling back and stated that she was seen on the 3/17 and still is not feeling any better. Per patient she has a cough/ congestion and now has no voice. Pt wanted to see if she can come in and has had 2 negative covid test, please advise. CB is [phone]

## 2020-09-06 NOTE — Telephone Encounter (Signed)
Okay to be seen in office?

## 2020-09-06 NOTE — Telephone Encounter (Signed)
I am OK with seeing her.

## 2020-09-06 NOTE — Telephone Encounter (Signed)
I spoke with pt. Appt scheduled for tomorrow at 11:45.

## 2020-09-07 ENCOUNTER — Ambulatory Visit (INDEPENDENT_AMBULATORY_CARE_PROVIDER_SITE_OTHER): Payer: Medicare Other

## 2020-09-07 ENCOUNTER — Ambulatory Visit (INDEPENDENT_AMBULATORY_CARE_PROVIDER_SITE_OTHER): Payer: Medicare Other | Admitting: Family Medicine

## 2020-09-07 ENCOUNTER — Encounter: Payer: Self-pay | Admitting: Family Medicine

## 2020-09-07 ENCOUNTER — Other Ambulatory Visit: Payer: Self-pay

## 2020-09-07 VITALS — BP 130/80 | HR 89 | Temp 98.3°F | Ht 67.0 in | Wt 168.1 lb

## 2020-09-07 DIAGNOSIS — J441 Chronic obstructive pulmonary disease with (acute) exacerbation: Secondary | ICD-10-CM

## 2020-09-07 DIAGNOSIS — R059 Cough, unspecified: Secondary | ICD-10-CM | POA: Diagnosis not present

## 2020-09-07 DIAGNOSIS — J9811 Atelectasis: Secondary | ICD-10-CM | POA: Diagnosis not present

## 2020-09-07 MED ORDER — AMOXICILLIN-POT CLAVULANATE 875-125 MG PO TABS: 1.0000 | ORAL_TABLET | Freq: Two times a day (BID) | ORAL | 0 refills | Status: DC

## 2020-09-07 MED ORDER — ALBUTEROL SULFATE HFA 108 (90 BASE) MCG/ACT IN AERS: 2.0000 | INHALATION_SPRAY | Freq: Four times a day (QID) | RESPIRATORY_TRACT | 1 refills | Status: DC | PRN

## 2020-09-07 MED ORDER — PREDNISONE 20 MG PO TABS
ORAL_TABLET | ORAL | 0 refills | Status: DC
Start: 2020-09-07 — End: 2020-09-13

## 2020-09-07 NOTE — Progress Notes (Signed)
Established Patient Office Visit  Subjective:  Patient ID: [name], female    DOB: 04-01-1946  Age: 75 y.o. MRN: [id number]  CC:  Chief Complaint  Patient presents with  . Cough    Congestion, losing voice    HPI [name] presents for follow-up for some cough.  She has longstanding history of smoking.  She quit about 10 years ago.  She states that she started coughing March 14.  This was particularly full-blown by the 15th.  She did virtual visit on the 17th and was prescribed Tessalon Perles.  She had some laryngitis symptoms.  She did to Covid test at home which were both negative.  Cough continue to worsen.  She called for appointment and was initially told she could not be seen but then after further consideration with her negative Covid test was told that she could be seen today  Coughing has been especially bothersome at night.  Cough past few days productive of yellow sputum.  She is concerned because she has surgery scheduled for bladder tumor excision April 5.  She has had previous low-dose CT lung cancer screening up until little over a year ago.  Which is diagnosed with bladder cancer last summer.  No sick contacts.  She does have emphysema changes on prior CT scans.  Does not take any regular inhalers.  Past Medical History:  Diagnosis Date  . Anxiety    aniety attacks  . Bladder tumor   . Bronchitis   . Colon polyps   . Depression   . Hay fever    allergies  . History of kidney stones   . Migraines    not in 40 years  . Rash    buttock x 1 year nystatin prn    Past Surgical History:  Procedure Laterality Date  . BREAST BIOPSY  1990  . caesarean secion  1974, 1977, 1984  . CESAREAN SECTION  [phone]  . COLONOSCOPY W/ POLYPECTOMY  last done 2016    x 3   . CYSTOSCOPY W/ RETROGRADES Bilateral 02/04/2020   Procedure: CYSTOSCOPY WITH RETROGRADE PYELOGRAM;  Surgeon: Lucas Mallow, MD;  Location: [facility];  Service:  Urology;  Laterality: Bilateral;  . OPEN REDUCTION INTERNAL FIXATION (ORIF) DISTAL RADIAL FRACTURE Right 11/03/2014   Procedure: OPEN REDUCTION INTERNAL FIXATION (ORIF) RIGHT DISTAL RADIAL FRACTURE WITH REPAIR AND RECONSTRUCTION;  Surgeon: Roseanne Kaufman, MD;  Location: [facility];  Service: Orthopedics;  Laterality: Right;  . TRANSURETHRAL RESECTION OF BLADDER TUMOR WITH MITOMYCIN-C N/A 02/04/2020   Procedure: TRANSURETHRAL RESECTION OF BLADDER TUMOR WITH POST OPERATIVE GEMCITABINE;  Surgeon: Lucas Mallow, MD;  Location: [facility];  Service: Urology;  Laterality: N/A;    Family History  Problem Relation Age of Onset  . Arthritis Other   . Stroke Other   . Hypertension Other   . Depression Other   . Migraines Other   . Colon polyps Other   . Breast cancer Neg Hx     Social History   Socioeconomic History  . Marital status: Married    Spouse name: Not on file  . Number of children: 3  . Years of education: 78  . Highest education level: Some college, no degree  Occupational History    Comment: Retired   Tobacco Use  . Smoking status: Former Smoker    Packs/day: 1.50    Years: 50.00    Pack years: 75.00    Types: Cigarettes  Quit date: 2012    Years since quitting: 10.2  . Smokeless tobacco: Never Used  . Tobacco comment: Former smoker  Media planner  . Vaping Use: Never used  Substance and Sexual Activity  . Alcohol use: Yes    Alcohol/week: 2.0 standard drinks    Types: 2 Glasses of wine per week    Comment: 2 glasses of wine daily  . Drug use: No  . Sexual activity: Not on file  Other Topics Concern  . Not on file  Social History Narrative   Married   3 children   Social Determinants of Health   Financial Resource Strain: Low Risk   . Difficulty of Paying Living Expenses: Not hard at all  Food Insecurity: No Food Insecurity  . Worried About Charity fundraiser in the Last Year: Never true  . Ran Out of Food in the Last Year: Never true   Transportation Needs: No Transportation Needs  . Lack of Transportation (Medical): No  . Lack of Transportation (Non-Medical): No  Physical Activity: Insufficiently Active  . Days of Exercise per Week: 3 days  . Minutes of Exercise per Session: 40 min  Stress: No Stress Concern Present  . Feeling of Stress : Not at all  Social Connections: Moderately Isolated  . Frequency of Communication with Friends and Family: More than three times a week  . Frequency of Social Gatherings with Friends and Family: More than three times a week  . Attends Religious Services: Never  . Active Member of Clubs or Organizations: No  . Attends Archivist Meetings: Never  . Marital Status: Married  Human resources officer Violence: Not At Risk  . Fear of Current or Ex-Partner: No  . Emotionally Abused: No  . Physically Abused: No  . Sexually Abused: No    Outpatient Medications Prior to Visit  Medication Sig Dispense Refill  . benzonatate (TESSALON PERLES) 100 MG capsule Take 1 capsule (100 mg total) by mouth 3 (three) times daily as needed. 20 capsule 0  . chlorpheniramine (CHLOR-TRIMETON) 4 MG tablet Take 4 mg by mouth 2 (two) times daily as needed for allergies.    Marland Kitchen FLUoxetine (PROZAC) 20 MG capsule TAKE 1 CAPSULE BY MOUTH EVERY DAY 90 capsule 3  . Multiple Vitamin (MULTIVITAMIN) capsule Take 1 capsule by mouth daily.     Facility-Administered Medications Prior to Visit  Medication Dose Route Frequency Provider Last Rate Last Admin  . gemcitabine (GEMZAR) chemo syringe for bladder instillation 2,000 mg  2,000 mg Bladder Instillation Once Robley Fries, MD        Allergies  Allergen Reactions  . Aspirin     REACTION: vasculitis    ROS Review of Systems  Constitutional: Negative for chills and fever.  Respiratory: Positive for cough and wheezing.   Cardiovascular: Negative for chest pain and leg swelling.  Gastrointestinal: Negative for abdominal pain, nausea and vomiting.       Objective:    Physical Exam Vitals reviewed.  Constitutional:      Appearance: Normal appearance.  Cardiovascular:     Rate and Rhythm: Normal rate and regular rhythm.  Pulmonary:     Comments: She has some diffuse expiratory wheezes.  Pulse oximeter at rest 93%.  No rales.  No increased work of breathing at rest. Musculoskeletal:     Cervical back: Neck supple.  Neurological:     Mental Status: She is alert.     BP 130/80   Pulse 89   Temp 98.3  F (36.8 C) (Oral)   Ht 5\' 7"  (1.702 m)   Wt 168 lb 2 oz (76.3 kg)   SpO2 93%   BMI 26.33 kg/m  Wt Readings from Last 3 Encounters:  09/07/20 168 lb 2 oz (76.3 kg)  08/30/20 174 lb 6.4 oz (79.1 kg)  08/20/20 173 lb 6.4 oz (78.7 kg)     Health Maintenance Due  Topic Date Due  . COVID-19 Vaccine (4 - Booster for [place] series) 08/16/2020  . PNA vac Low Risk Adult (2 of 2 - PPSV23) 08/31/2020    There are no preventive care reminders to display for this patient.  Lab Results  Component Value Date   TSH 0.89 08/30/2020   Lab Results  Component Value Date   WBC 5.2 11/03/2014   HGB 13.4 11/03/2014   HCT 41.0 11/03/2014   MCV 94.3 11/03/2014   PLT 268 11/03/2014   Lab Results  Component Value Date   NA 141 08/30/2020   K 3.7 08/30/2020   CO2 29 08/30/2020   GLUCOSE 94 08/30/2020   BUN 22 08/30/2020   CREATININE 0.99 08/30/2020   BILITOT 0.8 06/06/2012   ALKPHOS 62 06/06/2012   AST 18 06/06/2012   ALT 16 06/06/2012   PROT 7.0 06/06/2012   ALBUMIN 4.0 06/06/2012   CALCIUM 9.4 08/30/2020   GFR 55.74 (L) 08/30/2020   Lab Results  Component Value Date   CHOL 218 (H) 06/06/2012   Lab Results  Component Value Date   HDL 70.30 06/06/2012   Lab Results  Component Value Date   LDLCALC 91 05/04/2010   Lab Results  Component Value Date   TRIG 64.0 06/06/2012   Lab Results  Component Value Date   CHOLHDL 3 06/06/2012   No results found for: HGBA1C    Assessment & Plan:   Problem List Items  Addressed This Visit   None   Visit Diagnoses    Cough    -  Primary   Relevant Orders   DG Chest 2 View    She has history of emphysema changes on chest CT.  Given her productive coughing for several days and increased wheezing recommend the following  -Obtain PA and lateral chest x-ray -Start Augmentin 875 mg twice daily for 7 days -Prednisone 20 mg 2 tablets daily for 5 days -Albuterol inhaler 2 puffs every 4-6 hours as needed for wheezing -Follow-up immediately for any fever or increased shortness of breath.  Meds ordered this encounter  Medications  . predniSONE (DELTASONE) 20 MG tablet    Sig: Take two tablets by mouth once daily for 5 days    Dispense:  10 tablet    Refill:  0  . amoxicillin-clavulanate (AUGMENTIN) 875-125 MG tablet    Sig: Take 1 tablet by mouth 2 (two) times daily.    Dispense:  14 tablet    Refill:  0  . albuterol (PROVENTIL HFA) 108 (90 Base) MCG/ACT inhaler    Sig: Inhale 2 puffs into the lungs every 6 (six) hours as needed for wheezing or shortness of breath.    Dispense:  6.7 g    Refill:  1    Follow-up: No follow-ups on file.    Carolann Littler, MD

## 2020-09-07 NOTE — Patient Instructions (Signed)
Please call or follow up for any fever or increased shortness of breath.

## 2020-09-08 MED ORDER — ALBUTEROL SULFATE HFA 108 (90 BASE) MCG/ACT IN AERS: 2.0000 | INHALATION_SPRAY | Freq: Four times a day (QID) | RESPIRATORY_TRACT | 2 refills | Status: DC | PRN

## 2020-09-08 NOTE — Addendum Note (Signed)
Addended by: Rodrigo Ran on: 09/08/2020 08:03 AM   Modules accepted: Orders

## 2020-09-09 ENCOUNTER — Encounter: Payer: Self-pay | Admitting: Family Medicine

## 2020-09-10 ENCOUNTER — Other Ambulatory Visit: Payer: Self-pay

## 2020-09-10 NOTE — Telephone Encounter (Signed)
Spoke with patient.  She slept better last night and is feeling some better.  She will finish out the prednisone and antibiotic and let us know if any further concerns.

## 2020-09-10 NOTE — Telephone Encounter (Signed)
Pt is calling in stating that Dr. Elease Hashimoto was wanting her to come in the office to be seen is it okay to schedule the pt for a face to face visit.

## 2020-09-13 ENCOUNTER — Ambulatory Visit (INDEPENDENT_AMBULATORY_CARE_PROVIDER_SITE_OTHER): Payer: Medicare Other | Admitting: Family Medicine

## 2020-09-13 ENCOUNTER — Encounter: Payer: Self-pay | Admitting: Family Medicine

## 2020-09-13 ENCOUNTER — Other Ambulatory Visit: Payer: Self-pay

## 2020-09-13 VITALS — BP 120/62 | HR 79 | Temp 98.7°F | Wt 171.0 lb

## 2020-09-13 DIAGNOSIS — J441 Chronic obstructive pulmonary disease with (acute) exacerbation: Secondary | ICD-10-CM | POA: Diagnosis not present

## 2020-09-13 NOTE — Progress Notes (Signed)
Established Patient Office Visit  Subjective:  Patient ID: [name], female    DOB: February 01, 1946  Age: 75 y.o. MRN: [id number]  CC:  Chief Complaint  Patient presents with  . surgical clearance    Complains of SOB, just started today, pt states she thinks this is anxiety related    HPI [name] presents for follow-up.  She has long past history of smoking and was seen last week with some wheezing and cough.  She had 2 negative Covid tests.  We prescribed prednisone and Augmentin.  Her chest x-ray showed some atelectasis but no pneumonia.  We also started an inhaler which she thinks has helped.  She had some bad news earlier today that her sister  (who has dementia) has not been doing well.  She feels that has created some increased anxiety symptoms.  She had what she describes as a "panic attack "when coming here today.  She feels somewhat more settled now.    She was diagnosed with panic disorder years ago and has been on Prozac for many years.  This generally works well for her.  Overall, her cough is some better.  She has scheduled surgical procedure for bladder tumor later this week and was concerned because of that.  Denies any fevers or chills.  No hemoptysis.  Past Medical History:  Diagnosis Date  . Anxiety    aniety attacks  . Bladder tumor   . Bronchitis   . Colon polyps   . Depression   . Hay fever    allergies  . History of kidney stones   . Migraines    not in 40 years  . Rash    buttock x 1 year nystatin prn    Past Surgical History:  Procedure Laterality Date  . BREAST BIOPSY  1990  . caesarean secion  1974, 1977, 1984  . CESAREAN SECTION  [phone]  . COLONOSCOPY W/ POLYPECTOMY  last done 2016    x 3   . CYSTOSCOPY W/ RETROGRADES Bilateral 02/04/2020   Procedure: CYSTOSCOPY WITH RETROGRADE PYELOGRAM;  Surgeon: Lucas Mallow, MD;  Location: [facility];  Service: Urology;  Laterality: Bilateral;  . OPEN REDUCTION  INTERNAL FIXATION (ORIF) DISTAL RADIAL FRACTURE Right 11/03/2014   Procedure: OPEN REDUCTION INTERNAL FIXATION (ORIF) RIGHT DISTAL RADIAL FRACTURE WITH REPAIR AND RECONSTRUCTION;  Surgeon: Roseanne Kaufman, MD;  Location: [place];  Service: Orthopedics;  Laterality: Right;  . TRANSURETHRAL RESECTION OF BLADDER TUMOR WITH MITOMYCIN-C N/A 02/04/2020   Procedure: TRANSURETHRAL RESECTION OF BLADDER TUMOR WITH POST OPERATIVE GEMCITABINE;  Surgeon: Lucas Mallow, MD;  Location: [facility];  Service: Urology;  Laterality: N/A;    Family History  Problem Relation Age of Onset  . Arthritis Other   . Stroke Other   . Hypertension Other   . Depression Other   . Migraines Other   . Colon polyps Other   . Breast cancer Neg Hx     Social History   Socioeconomic History  . Marital status: Married    Spouse name: Not on file  . Number of children: 3  . Years of education: 78  . Highest education level: Some college, no degree  Occupational History    Comment: Retired   Tobacco Use  . Smoking status: Former Smoker    Packs/day: 1.50    Years: 50.00    Pack years: 75.00    Types: Cigarettes    Quit date: 2012  Years since quitting: 10.2  . Smokeless tobacco: Never Used  . Tobacco comment: Former smoker  Media planner  . Vaping Use: Never used  Substance and Sexual Activity  . Alcohol use: Yes    Alcohol/week: 2.0 standard drinks    Types: 2 Glasses of wine per week    Comment: 2 glasses of wine daily  . Drug use: No  . Sexual activity: Not on file  Other Topics Concern  . Not on file  Social History Narrative   Married   3 children   Social Determinants of Health   Financial Resource Strain: Low Risk   . Difficulty of Paying Living Expenses: Not hard at all  Food Insecurity: No Food Insecurity  . Worried About Charity fundraiser in the Last Year: Never true  . Ran Out of Food in the Last Year: Never true  Transportation Needs: No Transportation Needs  . Lack  of Transportation (Medical): No  . Lack of Transportation (Non-Medical): No  Physical Activity: Insufficiently Active  . Days of Exercise per Week: 3 days  . Minutes of Exercise per Session: 40 min  Stress: No Stress Concern Present  . Feeling of Stress : Not at all  Social Connections: Moderately Isolated  . Frequency of Communication with Friends and Family: More than three times a week  . Frequency of Social Gatherings with Friends and Family: More than three times a week  . Attends Religious Services: Never  . Active Member of Clubs or Organizations: No  . Attends Archivist Meetings: Never  . Marital Status: Married  Human resources officer Violence: Not At Risk  . Fear of Current or Ex-Partner: No  . Emotionally Abused: No  . Physically Abused: No  . Sexually Abused: No    Outpatient Medications Prior to Visit  Medication Sig Dispense Refill  . albuterol (VENTOLIN HFA) 108 (90 Base) MCG/ACT inhaler Inhale 2 puffs into the lungs every 6 (six) hours as needed for wheezing or shortness of breath. 8 g 2  . chlorpheniramine (CHLOR-TRIMETON) 4 MG tablet Take 4 mg by mouth 2 (two) times daily as needed for allergies.    Marland Kitchen FLUoxetine (PROZAC) 20 MG capsule TAKE 1 CAPSULE BY MOUTH EVERY DAY 90 capsule 3  . Multiple Vitamin (MULTIVITAMIN) capsule Take 1 capsule by mouth daily.    Marland Kitchen amoxicillin-clavulanate (AUGMENTIN) 875-125 MG tablet Take 1 tablet by mouth 2 (two) times daily. 14 tablet 0  . benzonatate (TESSALON PERLES) 100 MG capsule Take 1 capsule (100 mg total) by mouth 3 (three) times daily as needed. 20 capsule 0  . predniSONE (DELTASONE) 20 MG tablet Take two tablets by mouth once daily for 5 days 10 tablet 0   Facility-Administered Medications Prior to Visit  Medication Dose Route Frequency Provider Last Rate Last Admin  . gemcitabine (GEMZAR) chemo syringe for bladder instillation 2,000 mg  2,000 mg Bladder Instillation Once Robley Fries, MD        Allergies   Allergen Reactions  . Aspirin     REACTION: vasculitis    ROS Review of Systems  Constitutional: Negative for chills and fever.  Cardiovascular: Negative for palpitations and leg swelling.      Objective:    Physical Exam Vitals reviewed.  Constitutional:      Appearance: Normal appearance.  Cardiovascular:     Rate and Rhythm: Normal rate and regular rhythm.  Pulmonary:     Comments: Lungs are much clear to auscultation the last week.  No  wheezing.  Good breath sounds upper airways.  Slightly diminished in both bases Neurological:     Mental Status: She is alert.     BP 120/62 (BP Location: Left Arm, Patient Position: Sitting, Cuff Size: Normal)   Pulse 79   Temp 98.7 F (37.1 C) (Oral)   Wt 171 lb (77.6 kg)   SpO2 96%   BMI 26.78 kg/m  Wt Readings from Last 3 Encounters:  09/13/20 171 lb (77.6 kg)  09/07/20 168 lb 2 oz (76.3 kg)  08/30/20 174 lb 6.4 oz (79.1 kg)     Health Maintenance Due  Topic Date Due  . COVID-19 Vaccine (4 - Booster for [place] series) 08/16/2020  . PNA vac Low Risk Adult (2 of 2 - PPSV23) 08/31/2020    There are no preventive care reminders to display for this patient.  Lab Results  Component Value Date   TSH 0.89 08/30/2020   Lab Results  Component Value Date   WBC 5.2 11/03/2014   HGB 13.4 11/03/2014   HCT 41.0 11/03/2014   MCV 94.3 11/03/2014   PLT 268 11/03/2014   Lab Results  Component Value Date   NA 141 08/30/2020   K 3.7 08/30/2020   CO2 29 08/30/2020   GLUCOSE 94 08/30/2020   BUN 22 08/30/2020   CREATININE 0.99 08/30/2020   BILITOT 0.8 06/06/2012   ALKPHOS 62 06/06/2012   AST 18 06/06/2012   ALT 16 06/06/2012   PROT 7.0 06/06/2012   ALBUMIN 4.0 06/06/2012   CALCIUM 9.4 08/30/2020   GFR 55.74 (L) 08/30/2020   Lab Results  Component Value Date   CHOL 218 (H) 06/06/2012   Lab Results  Component Value Date   HDL 70.30 06/06/2012   Lab Results  Component Value Date   LDLCALC 91 05/04/2010   Lab  Results  Component Value Date   TRIG 64.0 06/06/2012   Lab Results  Component Value Date   CHOLHDL 3 06/06/2012   No results found for: HGBA1C    Assessment & Plan:   Recent acute exacerbation of COPD improved with antibiotics and steroids.  Her lung exam is improved today and repeat pulse oximetry 96%  -We think she is okay to go ahead with her surgery at this time.  She seems much improved today.  Continue with prn use of Albuterol.    No orders of the defined types were placed in this encounter.   Follow-up: No follow-ups on file.    Carolann Littler, MD

## 2020-09-15 ENCOUNTER — Encounter (HOSPITAL_BASED_OUTPATIENT_CLINIC_OR_DEPARTMENT_OTHER): Payer: Self-pay | Admitting: Urology

## 2020-09-15 ENCOUNTER — Other Ambulatory Visit: Payer: Self-pay

## 2020-09-15 NOTE — Progress Notes (Signed)
Spoke w/ via phone for pre-op interview---pt Lab needs dos----   none            Lab results------chest xray 09-08-2020 epic COVID test ------09-17-2020 1345 Arrive at -------800 am 09-21-2020 NPO after MN NO Solid Food.  Clear liquids from MN until---700 am then npo Med rec completed Medications to take morning of surgery -----albuterol inhaler prn/bring inhaler, fluoxetine Diabetic medication -----n/a Patient instructed to bring photo id and insurance card day of surgery Patient aware to have Driver (ride ) / caregiver spouse Herbie Baltimore will stay    for 24 hours after surgery  Patient Special Instructions -----none Pre-Op special Istructions -----none Patient verbalized understanding of instructions that were given at this phone interview. Patient denies shortness of breath, chest pain, fever, cough at this phone interview.  Medical clearance note dr Darnell Level burchette 08-16-2020 epic for 09-21-2020 surgery, pt had acute exacerbation of cod cough resolved finished augmentin 09-13-2020 and finished prednisone 09-11-2020 per patient.

## 2020-09-17 ENCOUNTER — Other Ambulatory Visit (HOSPITAL_COMMUNITY)
Admission: RE | Admit: 2020-09-17 | Discharge: 2020-09-17 | Disposition: A | Discharge status: Home / Self Care | Payer: Medicare Other | Source: Ambulatory Visit | Attending: Urology | Admitting: Urology

## 2020-09-17 DIAGNOSIS — Z01812 Encounter for preprocedural laboratory examination: Secondary | ICD-10-CM | POA: Insufficient documentation

## 2020-09-17 DIAGNOSIS — Z20822 Contact with and (suspected) exposure to covid-19: Secondary | ICD-10-CM | POA: Insufficient documentation

## 2020-09-18 LAB — SARS CORONAVIRUS 2 (TAT 6-24 HRS): SARS Coronavirus 2: NEGATIVE

## 2020-09-20 NOTE — H&P (Signed)
CC/HPI: cc: HG Ta bladder cancer   02/11/2020  Patient is status post TURBT. This revealed high-grade TA urothelial cell carcinoma. She had some weakness the 1st day or 2 but is feeling better now.   05/18/20: 75 year old woman with high-grade TA bladder cancer diagnosed on TURBT August 2021. She completed 6 week induction BCG. She is here for surveillance cystoscopy. She denies any gross hematuria or changes in urinary symptoms.    08/30/20: 75 year old woman with high-grade TA bladder cancer diagnosed on TURBT August 2021. She completed 6 week induction BCG. She is here for surveillance cystoscopy. She denies any gross hematuria or changes in urinary symptoms.     ALLERGIES: Aspirin TABS    MEDICATIONS: Chlorpheniramine Maleate 4 mg tablet  Estradiol  FLUoxetine HCl - 20 MG Oral Tablet Oral  Multivitamin  Nystatin-Triamcinolone     GU PSH: Bladder Instill AntiCA Agent - 04/05/2020, 03/29/2020, 03/22/2020, 03/15/2020, 03/08/2020, 03/01/2020, 02/04/2020 Cystoscopy - 05/18/2020, 01/06/2020 Cystoscopy Insert Stent - 2011 Cystoscopy TURBT <2 cm - 02/04/2020 ESWL - 2011       PSH Notes: Lithotripsy, Cystoscopy With Insertion Of Ureteral Stent Right, Biopsy Breast Percutaneous Needle Core, Cesarean Section, Wrist 2016   NON-GU PSH: Bx Breast Percut W/o Image - 2011 Cesarean Delivery Only - 2011     GU PMH: Bladder Cancer overlapping sites, Will send urine cytology today. If negative will repeat cystoscopy in 3 months. We briefly discussed maintenance and will follow up by phone after cytology has resulted. - 05/18/2020, - 04/05/2020, - 03/29/2020, - 03/22/2020, - 03/15/2020, - 03/08/2020, - 03/01/2020, - 02/11/2020 Acute Cystitis/UTI - 04/05/2020, Acute cystitis without hematuria, - 2014 Bladder tumor/neoplasm - 04/05/2020, - 03/01/2020, - 01/21/2020 Gross hematuria - 04/05/2020, - 01/06/2020 Other microscopic hematuria - 04/05/2020, Microscopic hematuria, - 2014 Renal calculus - 04/05/2020,  Kidney stone on right side, - 2014 Urge incontinence - 04/05/2020, - 01/06/2020      PMH Notes:  1898-06-19 00:00:00 - Note: Normal Routine History And Physical Adult   NON-GU PMH: Anxiety - 04/05/2020, Anxiety (Symptom), - 2014 Arthritis - 04/05/2020 Depression - 04/05/2020 Personal history of other endocrine, nutritional and metabolic disease - 16/03/9603, History of hypercholesterolemia, - 2014 Personal history of other mental and behavioral disorders - 04/05/2020, History of depression, - 2014    FAMILY HISTORY: Acute Myocardial Infarction - Runs In Family Congestive Heart Failure - Runs In Family Family Health Status Number - Runs In Family Heart Block - Runs In Family Murmurs - Runs In Family   SOCIAL HISTORY: None    Notes: Tobacco Use, Alcohol Use, Occupation:, Caffeine Use, Marital History - Currently Married   REVIEW OF SYSTEMS:    GU Review Female:   Patient denies frequent urination, hard to postpone urination, burning /pain with urination, get up at night to urinate, leakage of urine, stream starts and stops, trouble starting your stream, have to strain to urinate, and being pregnant.  Gastrointestinal (Upper):   Patient denies nausea, vomiting, and indigestion/ heartburn.  Gastrointestinal (Lower):   Patient denies diarrhea and constipation.  Constitutional:   Patient denies fever, night sweats, weight loss, and fatigue.  Skin:   Patient denies skin rash/ lesion and itching.  Eyes:   Patient denies blurred vision and double vision.  Ears/ Nose/ Throat:   Patient denies sore throat and sinus problems.  Hematologic/Lymphatic:   Patient denies swollen glands and easy bruising.  Cardiovascular:   Patient denies leg swelling and chest pains.  Respiratory:   Patient denies cough and shortness of  breath.  Endocrine:   Patient denies excessive thirst.  Musculoskeletal:   Patient denies back pain and joint pain.  Neurological:   Patient denies headaches and dizziness.   Psychologic:   Patient denies depression and anxiety.   VITAL SIGNS: None   MULTI-SYSTEM PHYSICAL EXAMINATION:    Constitutional: Well-nourished. No physical deformities. Normally developed. Good grooming.  Neck: Neck symmetrical, not swollen. Normal tracheal position.  Respiratory: No labored breathing, no use of accessory muscles.   Skin: No paleness, no jaundice, no cyanosis. No lesion, no ulcer, no rash.  Neurologic / Psychiatric: Oriented to time, oriented to place, oriented to person. No depression, no anxiety, no agitation.  Gastrointestinal: No rigidity, non obese abdomen.   Eyes: Normal conjunctivae. Normal eyelids.  Ears, Nose, Mouth, and Throat: Left ear no scars, no lesions, no masses. Right ear no scars, no lesions, no masses. Nose no scars, no lesions, no masses. Normal hearing. Normal lips.  Musculoskeletal: Normal gait and station of head and neck.     Complexity of Data:  Records Review:   Previous Patient Records  Urine Test Review:   Urinalysis   PROCEDURES:         Flexible Cystoscopy - 52000  Risks, benefits, and some of the potential complications of the procedure were discussed at length with the patient including infection, bleeding, voiding discomfort, urinary retention, fever, chills, sepsis, and others. All questions were answered. Informed consent was obtained. Sterile technique and intraurethral analgesia were used.  Meatus:  Normal size. Normal location. Normal condition.  Urethra:  Normal  Ureteral Orifices:  Normal location. Normal size. Normal shape. Effluxed clear urine.  Bladder:  Small papillary bladder tumor 0.5 cm adjacent to left ureteral orifice, early papillary growth adjacent to right ureteral orifice      The lower urinary tract was carefully examined. The procedure was well-tolerated and without complications. Instructions were given to call the office immediately for bloody urine, difficulty urinating, urinary retention, painful or frequent  urination, fever, chills, nausea, vomiting or other illness. The patient stated that she understood these instructions and would comply with them.         Urinalysis Dipstick Dipstick Cont'd  Color: Yellow Bilirubin: Neg mg/dL  Appearance: Clear Ketones: Neg mg/dL  Specific Gravity: 1.025 Blood: Neg ery/uL  pH: 5.5 Protein: Neg mg/dL  Glucose: Neg mg/dL Urobilinogen: 0.2 mg/dL    Nitrites: Neg    Leukocyte Esterase: Neg leu/uL    ASSESSMENT:      ICD-10 Details  1 GU:   Bladder Cancer overlapping sites - C67.8 Chronic, Exacerbation - Patient with bladder cancer recurrence on surveillance cystoscopy today. We discussed the risks and benefits of cystoscopy with trans urethral resection of bladder tumor and intravesical gemcitabine. Risks include but are not limited to bleeding, pain, infection, damage to surrounding structures including the bladder, urethra and ureters, need for Foley catheter, bladder perforation, pain. Patient understands and wishes to proceed with surgery. This will be scheduled the next available date.   PLAN:

## 2020-09-21 ENCOUNTER — Other Ambulatory Visit: Payer: Self-pay

## 2020-09-21 ENCOUNTER — Ambulatory Visit (HOSPITAL_BASED_OUTPATIENT_CLINIC_OR_DEPARTMENT_OTHER)
Admission: RE | Admit: 2020-09-21 | Discharge: 2020-09-21 | Disposition: A | Discharge status: Home / Self Care | Payer: Medicare Other | Attending: Urology | Admitting: Urology

## 2020-09-21 ENCOUNTER — Ambulatory Visit (HOSPITAL_BASED_OUTPATIENT_CLINIC_OR_DEPARTMENT_OTHER): Payer: Medicare Other | Admitting: Anesthesiology

## 2020-09-21 ENCOUNTER — Encounter (HOSPITAL_BASED_OUTPATIENT_CLINIC_OR_DEPARTMENT_OTHER)
Admission: RE | Disposition: A | Discharge status: Home / Self Care | Payer: Self-pay | Source: Home / Self Care | Attending: Urology

## 2020-09-21 ENCOUNTER — Encounter (HOSPITAL_BASED_OUTPATIENT_CLINIC_OR_DEPARTMENT_OTHER): Payer: Self-pay | Admitting: Urology

## 2020-09-21 DIAGNOSIS — C679 Malignant neoplasm of bladder, unspecified: Secondary | ICD-10-CM | POA: Diagnosis not present

## 2020-09-21 DIAGNOSIS — J449 Chronic obstructive pulmonary disease, unspecified: Secondary | ICD-10-CM | POA: Diagnosis not present

## 2020-09-21 DIAGNOSIS — C678 Malignant neoplasm of overlapping sites of bladder: Secondary | ICD-10-CM | POA: Diagnosis not present

## 2020-09-21 DIAGNOSIS — Z8249 Family history of ischemic heart disease and other diseases of the circulatory system: Secondary | ICD-10-CM | POA: Diagnosis not present

## 2020-09-21 DIAGNOSIS — M8580 Other specified disorders of bone density and structure, unspecified site: Secondary | ICD-10-CM | POA: Diagnosis not present

## 2020-09-21 DIAGNOSIS — Z886 Allergy status to analgesic agent status: Secondary | ICD-10-CM | POA: Insufficient documentation

## 2020-09-21 DIAGNOSIS — E785 Hyperlipidemia, unspecified: Secondary | ICD-10-CM | POA: Diagnosis not present

## 2020-09-21 HISTORY — PX: CYSTOSCOPY: SHX5120

## 2020-09-21 HISTORY — DX: Presence of spectacles and contact lenses: Z97.3

## 2020-09-21 HISTORY — PX: TRANSURETHRAL RESECTION OF BLADDER TUMOR WITH MITOMYCIN-C: SHX6459

## 2020-09-21 HISTORY — DX: Chronic obstructive pulmonary disease, unspecified: J44.9

## 2020-09-21 SURGERY — TRANSURETHRAL RESECTION OF BLADDER TUMOR WITH MITOMYCIN-C
Anesthesia: General | Site: Bladder

## 2020-09-21 MED ORDER — ONDANSETRON HCL 4 MG/2ML IJ SOLN
INTRAMUSCULAR | Status: DC | PRN
  Administered 2020-09-21: 4 mg via INTRAVENOUS

## 2020-09-21 MED ORDER — ONDANSETRON HCL 4 MG/2ML IJ SOLN
INTRAMUSCULAR | Status: AC
  Filled 2020-09-21: qty 2

## 2020-09-21 MED ORDER — AMISULPRIDE (ANTIEMETIC) 5 MG/2ML IV SOLN
INTRAVENOUS | Status: AC
  Filled 2020-09-21: qty 2

## 2020-09-21 MED ORDER — LIDOCAINE 2% (20 MG/ML) 5 ML SYRINGE
INTRAMUSCULAR | Status: AC
  Filled 2020-09-21: qty 5

## 2020-09-21 MED ORDER — LIDOCAINE HCL (CARDIAC) PF 100 MG/5ML IV SOSY
PREFILLED_SYRINGE | INTRAVENOUS | Status: DC | PRN
  Administered 2020-09-21: 60 mg via INTRAVENOUS

## 2020-09-21 MED ORDER — CEFAZOLIN SODIUM-DEXTROSE 2-4 GM/100ML-% IV SOLN
INTRAVENOUS | Status: AC
  Filled 2020-09-21: qty 100

## 2020-09-21 MED ORDER — ACETAMINOPHEN 500 MG PO TABS
1000.0000 mg | ORAL_TABLET | Freq: Once | ORAL | Status: AC
  Administered 2020-09-21: 1000 mg via ORAL

## 2020-09-21 MED ORDER — PROPOFOL 10 MG/ML IV BOLUS
INTRAVENOUS | Status: DC | PRN
  Administered 2020-09-21: 150 mg via INTRAVENOUS

## 2020-09-21 MED ORDER — DEXAMETHASONE SODIUM PHOSPHATE 4 MG/ML IJ SOLN
INTRAMUSCULAR | Status: DC | PRN
  Administered 2020-09-21: 5 mg via INTRAVENOUS

## 2020-09-21 MED ORDER — SODIUM CHLORIDE 0.9 % IR SOLN
Status: DC | PRN
  Administered 2020-09-21: 3000 mL via INTRAVESICAL

## 2020-09-21 MED ORDER — PHENYLEPHRINE HCL (PRESSORS) 10 MG/ML IV SOLN
INTRAVENOUS | Status: DC | PRN
  Administered 2020-09-21 (×2): 80 ug via INTRAVENOUS

## 2020-09-21 MED ORDER — ACETAMINOPHEN 500 MG PO TABS
ORAL_TABLET | ORAL | Status: AC
  Filled 2020-09-21: qty 2

## 2020-09-21 MED ORDER — BELLADONNA ALKALOIDS-OPIUM 16.2-30 MG RE SUPP
RECTAL | Status: AC
  Filled 2020-09-21: qty 1

## 2020-09-21 MED ORDER — LACTATED RINGERS IV SOLN: INTRAVENOUS | Status: DC

## 2020-09-21 MED ORDER — CLOBETASOL PROPIONATE 0.05 % EX OINT
TOPICAL_OINTMENT | Freq: Two times a day (BID) | CUTANEOUS | Status: DC
  Filled 2020-09-21 (×2): qty 15

## 2020-09-21 MED ORDER — FENTANYL CITRATE (PF) 100 MCG/2ML IJ SOLN
INTRAMUSCULAR | Status: AC
  Filled 2020-09-21: qty 2

## 2020-09-21 MED ORDER — AMISULPRIDE (ANTIEMETIC) 5 MG/2ML IV SOLN: 5.0000 mg | Freq: Once | INTRAVENOUS | Status: DC | PRN

## 2020-09-21 MED ORDER — AMISULPRIDE (ANTIEMETIC) 5 MG/2ML IV SOLN
INTRAVENOUS | Status: DC | PRN
  Administered 2020-09-21: 5 mg via INTRAVENOUS

## 2020-09-21 MED ORDER — ROCURONIUM BROMIDE 100 MG/10ML IV SOLN
INTRAVENOUS | Status: DC | PRN
  Administered 2020-09-21: 40 mg via INTRAVENOUS

## 2020-09-21 MED ORDER — FENTANYL CITRATE (PF) 100 MCG/2ML IJ SOLN
INTRAMUSCULAR | Status: DC | PRN
  Administered 2020-09-21: 100 ug via INTRAVENOUS

## 2020-09-21 MED ORDER — PROPOFOL 10 MG/ML IV BOLUS
INTRAVENOUS | Status: AC
  Filled 2020-09-21: qty 20

## 2020-09-21 MED ORDER — ARTIFICIAL TEARS OPHTHALMIC OINT
TOPICAL_OINTMENT | OPHTHALMIC | Status: AC
  Filled 2020-09-21: qty 3.5

## 2020-09-21 MED ORDER — SUGAMMADEX SODIUM 200 MG/2ML IV SOLN
INTRAVENOUS | Status: DC | PRN
  Administered 2020-09-21: 200 mg via INTRAVENOUS

## 2020-09-21 MED ORDER — BELLADONNA ALKALOIDS-OPIUM 16.2-30 MG RE SUPP
1.0000 | Freq: Once | RECTAL | Status: AC
  Administered 2020-09-21: 1 via RECTAL

## 2020-09-21 MED ORDER — TRAMADOL HCL 50 MG PO TABS: 50.0000 mg | ORAL_TABLET | Freq: Four times a day (QID) | ORAL | 0 refills | Status: DC | PRN

## 2020-09-21 MED ORDER — ONDANSETRON HCL 4 MG/2ML IJ SOLN: 4.0000 mg | Freq: Once | INTRAMUSCULAR | Status: DC | PRN

## 2020-09-21 MED ORDER — FENTANYL CITRATE (PF) 100 MCG/2ML IJ SOLN
25.0000 ug | INTRAMUSCULAR | Status: DC | PRN
  Administered 2020-09-21 (×4): 25 ug via INTRAVENOUS

## 2020-09-21 MED ORDER — PHENYLEPHRINE 40 MCG/ML (10ML) SYRINGE FOR IV PUSH (FOR BLOOD PRESSURE SUPPORT)
PREFILLED_SYRINGE | INTRAVENOUS | Status: AC
  Filled 2020-09-21: qty 10

## 2020-09-21 MED ORDER — CEFAZOLIN SODIUM-DEXTROSE 2-4 GM/100ML-% IV SOLN
2.0000 g | INTRAVENOUS | Status: AC
  Administered 2020-09-21: 2 g via INTRAVENOUS

## 2020-09-21 MED ORDER — GEMCITABINE CHEMO FOR BLADDER INSTILLATION 2000 MG
2000.0000 mg | Freq: Once | INTRAVENOUS | Status: AC
  Administered 2020-09-21: 2000 mg via INTRAVESICAL
  Filled 2020-09-21: qty 2000

## 2020-09-21 SURGICAL SUPPLY — 23 items
BAG DRAIN URO-CYSTO SKYTR STRL (DRAIN) ×2 IMPLANT
BAG DRN RND TRDRP ANRFLXCHMBR (UROLOGICAL SUPPLIES) ×1
BAG DRN UROCATH (DRAIN) ×1
BAG URINE DRAIN 2000ML AR STRL (UROLOGICAL SUPPLIES) ×1 IMPLANT
CATH FOLEY 2WAY SLVR  5CC 18FR (CATHETERS) ×2
CATH FOLEY 2WAY SLVR 5CC 18FR (CATHETERS) IMPLANT
CLOTH BEACON ORANGE TIMEOUT ST (SAFETY) ×2 IMPLANT
ELECT REM PT RETURN 9FT ADLT (ELECTROSURGICAL) ×2
ELECTRODE REM PT RTRN 9FT ADLT (ELECTROSURGICAL) ×1 IMPLANT
GLOVE SURG ENC MOIS LTX SZ6.5 (GLOVE) ×3 IMPLANT
GLOVE SURG UNDER POLY LF SZ6.5 (GLOVE) ×1 IMPLANT
GOWN STRL REUS W/TWL LRG LVL3 (GOWN DISPOSABLE) ×3 IMPLANT
IV NS IRRIG 3000ML ARTHROMATIC (IV SOLUTION) ×1 IMPLANT
KIT TURNOVER CYSTO (KITS) ×2 IMPLANT
LOOP CUT BIPOLAR 24F LRG (ELECTROSURGICAL) ×1 IMPLANT
MANIFOLD NEPTUNE II (INSTRUMENTS) ×2 IMPLANT
PACK CYSTO (CUSTOM PROCEDURE TRAY) ×2 IMPLANT
PLUG CATH AND CAP STER (CATHETERS) ×1 IMPLANT
SYR TOOMEY IRRIG 70ML (MISCELLANEOUS) ×2
SYRINGE TOOMEY IRRIG 70ML (MISCELLANEOUS) IMPLANT
TUBE CONNECTING 12X1/4 (SUCTIONS) ×2 IMPLANT
TUBING UROLOGY SET (TUBING) ×2 IMPLANT
WATER STERILE IRR 500ML POUR (IV SOLUTION) ×1 IMPLANT

## 2020-09-21 NOTE — Op Note (Signed)
PATIENT:  [name]  PRE-OPERATIVE DIAGNOSIS: Bladder cancer recurrence  POST-OPERATIVE DIAGNOSIS: Same  PROCEDURE:  Procedure(s): 1. Cystoscopy with fulguration 2. Instillation of intravesical chemotherapy (Gemcitabine)  SURGEON:  Jacalyn Lefevre, MD  ANESTHESIA:   General  EBL:  Minimal  DRAINS: Urethral catheter (18 Fr. Foley)   SPECIMEN:  none  DISPOSITION OF SPECIMEN:  None  FINDINGS: 1.  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  Normal urethra 3.  Orthotopic right ureteral orifice, previously resected left ureteral orifice 4.   Papillary bladder mass adjacent to left ureteral orifice and early papillary growth adjacent to right ureteral orifice  Indication: 75 year old woman with a history of high-grade Ta bladder cancer with evidence of recurrence on last surveillance cystoscopy.  Description of operation: The patient was taken to the operating room and administered general anesthesia. They were then placed on the table and moved to the dorsal lithotomy position after which the genitalia was sterilely prepped and draped. An official timeout was then performed.  The 8 French resectoscope with the 30 lens and visual obturator were then passed into the bladder under direct visualization. Urethra appeared normal. The visual obturator was then removed and the Gyrus resectoscope element with 30  lens was then inserted and the bladder was fully and systematically inspected.   There was a small subcentimeter papillary bladder mass consistent with recurrence just lateral to left ureteral orifice.  This gently scraped off the surface and was fulgurated.  There was also evidence of early papillary regrowth adjacent to right ureteral orifice and this was also fulgurated.  Reinspection of the bladder revealed all abnormal bladder mucosa was fulgurated and there was no evidence of perforation. The Microvasive evacuator was then used to  irrigate the bladder and remove all of the portions of bladder tumor which were sent to pathology. I then removed the resectoscope.  A 20 French Foley catheter was then inserted in the bladder and irrigated. The irrigant returned slightly pink with no clots. The patient was awakened and taken to the recovery room.  While in the recovery room 2 g of gemcitabine in 52.6 cc of sterile water was instilled in the bladder through the catheter and the catheter was plugged. This will remain indwelling for approximately one hour. It will then be drained from the bladder and the catheter will be removed and the patient discharged home.  PLAN OF CARE: Discharge to home after PACU  PATIENT DISPOSITION:  PACU - hemodynamically stable.

## 2020-09-21 NOTE — Anesthesia Procedure Notes (Signed)
Procedure Name: Intubation Date/Time: 09/21/2020 9:39 AM Performed by: Justice Rocher, CRNA Pre-anesthesia Checklist: Patient identified, Emergency Drugs available, Suction available, Patient being monitored and Timeout performed Patient Re-evaluated:Patient Re-evaluated prior to induction Oxygen Delivery Method: Circle system utilized Preoxygenation: Pre-oxygenation with 100% oxygen Induction Type: IV induction Ventilation: Mask ventilation without difficulty Laryngoscope Size: Mac and 3 Grade View: Grade II Tube type: Oral Tube size: 7.0 mm Number of attempts: 1 Airway Equipment and Method: Stylet and Oral airway Placement Confirmation: ETT inserted through vocal cords under direct vision,  positive ETCO2,  breath sounds checked- equal and bilateral and CO2 detector Secured at: 22 cm Tube secured with: Tape Dental Injury: Teeth and Oropharynx as per pre-operative assessment

## 2020-09-21 NOTE — Anesthesia Postprocedure Evaluation (Signed)
Anesthesia Post Note  Patient: [name]  Procedure(s) Performed: CYSTOSCOPY WITH BLADDER FULGARATION WITH POST OP GEMCITABINE (N/A Bladder) CYSTOSCOPY (N/A Bladder)     Patient location during evaluation: PACU Anesthesia Type: General Level of consciousness: awake Pain management: pain level controlled Vital Signs Assessment: post-procedure vital signs reviewed and stable Respiratory status: spontaneous breathing, nonlabored ventilation, respiratory function stable and patient connected to nasal cannula oxygen Cardiovascular status: blood pressure returned to baseline and stable Postop Assessment: no apparent nausea or vomiting Anesthetic complications: no   No complications documented.  Last Vitals:  Vitals:   09/21/20 1200 09/21/20 1230  BP:  125/72  Pulse: 72 75  Resp: (!) 9 19  Temp: 36.5 C 36.5 C  SpO2: 95% 93%    Last Pain:  Vitals:   09/21/20 1230  TempSrc:   PainSc: 0-No pain                 Bunyan Brier P Katia Hannen

## 2020-09-21 NOTE — Discharge Instructions (Signed)
Transurethral Resection of Bladder Tumor (TURBT)   Definition:  Transurethral Resection of the Bladder Tumor is a surgical procedure used to diagnose and remove tumors within the bladder. TURBT is the most common treatment for early stage bladder cancer.  General instructions:     Your recent bladder surgery requires very little post hospital care but some definite precautions.  Despite the fact that no skin incisions were used, the area around the bladder incisions are raw and covered with scabs to promote healing and prevent bleeding. Certain precautions are needed to insure that the scabs are not disturbed over the next 2-4 weeks while the healing proceeds.  Because the raw surface inside your bladder and the irritating effects of urine you may expect frequency of urination and/or urgency (a stronger desire to urinate) and perhaps even getting up at night more often. This will usually resolve or improve slowly over the healing period. You may see some blood in your urine over the first 6 weeks. Do not be alarmed, even if the urine was clear for a while. Get off your feet and drink lots of fluids until clearing occurs. If you start to pass clots or don't improve call us.  Catheter: (If you are discharged with a catheter.)  1. Keep your catheter secured to your leg at all times with tape or the supplied strap. 2. You may experience leakage of urine around your catheter- as long as the  catheter continues to drain, this is normal.  If your catheter stops draining  go to the ER. 3. You may also have blood in your urine, even after it has been clear for  several days; you may even pass some small blood clots or other material.  This  is normal as well.  If this happens, sit down and drink plenty of water to help  make urine to flush out your bladder.  If the blood in your urine becomes worse  after doing this, contact our office or return to the ER. 4. You may use the leg bag (small bag)  during the day, but use the large bag at  night.  Diet:  You may return to your normal diet immediately. Because of the raw surface of your bladder, alcohol, spicy foods, foods high in acid and drinks with caffeine may cause irritation or frequency and should be used in moderation. To keep your urine flowing freely and avoid constipation, drink plenty of fluids during the day (8-10 glasses). Tip: Avoid cranberry juice because it is very acidic.  Activity:  Your physical activity doesn't need to be restricted. However, if you are very active, you may see some blood in the urine. We suggest that you reduce your activity under the circumstances until the bleeding has stopped.  Bowels:  It is important to keep your bowels regular during the postoperative period. Straining with bowel movements can cause bleeding. A bowel movement every other day is reasonable. Use a mild laxative if needed, such as milk of magnesia 2-3 tablespoons, or 2 Dulcolax tablets. Call if you continue to have problems. If you had been taking narcotics for pain, before, during or after your surgery, you may be constipated. Take a laxative if necessary.    Medication:  You should resume your pre-surgery medications unless told not to. In addition you may be given an antibiotic to prevent or treat infection. Antibiotics are not always necessary. All medication should be taken as prescribed until the bottles are finished unless you are having   an unusual reaction to one of the drugs.   Post Anesthesia Home Care Instructions  Activity: Get plenty of rest for the remainder of the day. A responsible individual must stay with you for 24 hours following the procedure.  For the next 24 hours, DO NOT: -Drive a car -Paediatric nurse -Drink alcoholic beverages -Take any medication unless instructed by your physician -Make any legal decisions or sign important papers.  Meals: Start with liquid foods such as gelatin or soup.  Progress to regular foods as tolerated. Avoid greasy, spicy, heavy foods. If nausea and/or vomiting occur, drink only clear liquids until the nausea and/or vomiting subsides. Call your physician if vomiting continues.  Special Instructions/Symptoms: Your throat may feel dry or sore from the anesthesia or the breathing tube placed in your throat during surgery. If this causes discomfort, gargle with warm salt water. The discomfort should disappear within 24 hours.  If you had a scopolamine patch placed behind your ear for the management of post- operative nausea and/or vomiting:  1. The medication in the patch is effective for 72 hours, after which it should be removed.  Wrap patch in a tissue and discard in the trash. Wash hands thoroughly with soap and water. 2. You may remove the patch earlier than 72 hours if you experience unpleasant side effects which may include dry mouth, dizziness or visual disturbances. 3. Avoid touching the patch. Wash your hands with soap and water after contact with the patch.

## 2020-09-21 NOTE — Interval H&P Note (Signed)
History and Physical Interval Note:  09/21/2020 9:04 AM  [name]  has presented today for surgery, with the diagnosis of BLADDER CANCER.  The various methods of treatment have been discussed with the patient and family. After consideration of risks, benefits and other options for treatment, the patient has consented to  Procedure(s) with comments: TRANSURETHRAL RESECTION OF BLADDER TUMOR WITH GEMCITABINE (N/A) - 1 HR CYSTOSCOPY (N/A) as a surgical intervention.  The patient's history has been reviewed, patient examined, no change in status, stable for surgery.  I have reviewed the patient's chart and labs.  Questions were answered to the patient's satisfaction.     Patrena Santalucia D Cochise Dinneen

## 2020-09-21 NOTE — Transfer of Care (Signed)
Immediate Anesthesia Transfer of Care Note  Patient: [name]  Procedure(s) Performed: Procedure(s) (LRB): CYSTOSCOPY WITH BLADDER FULGARATION WITH POST OP GEMCITABINE (N/A) CYSTOSCOPY (N/A)  Patient Location: PACU  Anesthesia Type: General  Level of Consciousness: awake, sedated, patient cooperative and responds to stimulation  Airway & Oxygen Therapy: Patient Spontanous Breathing and Patient connected to  02 and soft FM   Post-op Assessment: Report given to PACU RN, Post -op Vital signs reviewed and stable and Patient moving all extremities  Post vital signs: Reviewed and stable  Complications: No apparent anesthesia complications

## 2020-09-21 NOTE — Anesthesia Preprocedure Evaluation (Addendum)
Anesthesia Evaluation  Patient identified by MRN, date of birth, ID band Patient awake    Reviewed: Allergy & Precautions, NPO status , Patient's Chart, lab work & pertinent test results  Airway Mallampati: III  TM Distance: >3 FB Neck ROM: Full    Dental  (+) Loose, Dental Advisory Given,    Pulmonary COPD,  COPD inhaler, former smoker,    Pulmonary exam normal breath sounds clear to auscultation       Cardiovascular negative cardio ROS Normal cardiovascular exam Rhythm:Regular Rate:Normal     Neuro/Psych  Headaches, PSYCHIATRIC DISORDERS Anxiety Depression    GI/Hepatic negative GI ROS, Neg liver ROS,   Endo/Other  negative endocrine ROS  Renal/GU Renal disease     Musculoskeletal negative musculoskeletal ROS (+)   Abdominal   Peds  Hematology negative hematology ROS (+)   Anesthesia Other Findings BLADDER CANCER  Reproductive/Obstetrics                           Anesthesia Physical Anesthesia Plan  ASA: III  Anesthesia Plan: General   Post-op Pain Management:    Induction: Intravenous  PONV Risk Score and Plan: 4 or greater and Ondansetron, Dexamethasone, Treatment may vary due to age or medical condition and Amisulpride  Airway Management Planned: Oral ETT  Additional Equipment:   Intra-op Plan:   Post-operative Plan: Extubation in OR  Informed Consent: I have reviewed the patients History and Physical, chart, labs and discussed the procedure including the risks, benefits and alternatives for the proposed anesthesia with the patient or authorized representative who has indicated his/her understanding and acceptance.       Plan Discussed with: CRNA  Anesthesia Plan Comments:        Anesthesia Quick Evaluation

## 2020-09-22 ENCOUNTER — Encounter (HOSPITAL_BASED_OUTPATIENT_CLINIC_OR_DEPARTMENT_OTHER): Payer: Self-pay | Admitting: Urology

## 2020-09-22 ENCOUNTER — Other Ambulatory Visit: Payer: Self-pay | Admitting: Family Medicine

## 2020-10-11 DIAGNOSIS — C678 Malignant neoplasm of overlapping sites of bladder: Secondary | ICD-10-CM | POA: Diagnosis not present

## 2020-11-26 DIAGNOSIS — S80811A Abrasion, right lower leg, initial encounter: Secondary | ICD-10-CM | POA: Diagnosis not present

## 2020-11-26 DIAGNOSIS — L820 Inflamed seborrheic keratosis: Secondary | ICD-10-CM | POA: Diagnosis not present

## 2020-11-26 DIAGNOSIS — D1801 Hemangioma of skin and subcutaneous tissue: Secondary | ICD-10-CM | POA: Diagnosis not present

## 2020-12-03 ENCOUNTER — Other Ambulatory Visit: Payer: Self-pay | Admitting: Family Medicine

## 2020-12-03 DIAGNOSIS — E2839 Other primary ovarian failure: Secondary | ICD-10-CM

## 2020-12-08 ENCOUNTER — Other Ambulatory Visit: Payer: Self-pay

## 2020-12-08 ENCOUNTER — Ambulatory Visit
Admission: RE | Admit: 2020-12-08 | Discharge: 2020-12-08 | Disposition: A | Discharge status: Home / Self Care | Payer: Medicare Other | Source: Ambulatory Visit | Attending: Family Medicine | Admitting: Family Medicine

## 2020-12-08 DIAGNOSIS — M8588 Other specified disorders of bone density and structure, other site: Secondary | ICD-10-CM | POA: Diagnosis not present

## 2020-12-08 DIAGNOSIS — Z780 Asymptomatic menopausal state: Secondary | ICD-10-CM | POA: Diagnosis not present

## 2020-12-08 DIAGNOSIS — E2839 Other primary ovarian failure: Secondary | ICD-10-CM

## 2020-12-08 DIAGNOSIS — M810 Age-related osteoporosis without current pathological fracture: Secondary | ICD-10-CM | POA: Diagnosis not present

## 2020-12-14 ENCOUNTER — Encounter: Payer: Self-pay | Admitting: Family Medicine

## 2020-12-14 ENCOUNTER — Ambulatory Visit (INDEPENDENT_AMBULATORY_CARE_PROVIDER_SITE_OTHER): Payer: Medicare Other | Admitting: Family Medicine

## 2020-12-14 ENCOUNTER — Other Ambulatory Visit: Payer: Self-pay

## 2020-12-14 VITALS — BP 110/62 | HR 55 | Temp 97.5°F | Wt 166.2 lb

## 2020-12-14 DIAGNOSIS — J439 Emphysema, unspecified: Secondary | ICD-10-CM

## 2020-12-14 DIAGNOSIS — M810 Age-related osteoporosis without current pathological fracture: Secondary | ICD-10-CM

## 2020-12-14 NOTE — Patient Instructions (Signed)
We do need to consider repeat DEXA scan in 2 years.   Recommend daily Vit D 800 to 1,000 IU and Calcium 1,200 mg

## 2020-12-14 NOTE — Progress Notes (Signed)
Established Patient Office Visit  Subjective:  Patient ID: [name], female    DOB: 02/16/1946  Age: 75 y.o. MRN: [id number]  CC:  Chief Complaint  Patient presents with   Follow-up    Discuss dexa results.    HPI [name] presents for discussion regarding recent DEXA scan.  She actually was diagnosed with osteopenia years ago and had DEXA scan 2011 which showed borderline osteoporosis.  She states that she had taken Fosamax she estimates at least 10 years up until 2011 when this was discontinued.  She takes some type of multivitamin but does not consistently take calcium or vitamin D.  Recent DEXA scan showed T score of -2.7 right femur with AP spine -2.0.  She did have a fall back in 2016 with comminuted fracture right distal metaphysis.  No other fractures reported..  She quit smoking several years ago.  She also had questions regarding the fact that she states that some people have mentioned or question whether she has COPD.  Previous low-dose CT lung cancer screens are shown evidence for emphysema.  She does not recall having any prior pulmonary function testing.  She does not have any chronic cough or significant dyspnea with activities.  She has used rescue inhaler occasionally in the past but not consistently.  Currently being treated for bladder cancer.  She has follow-up in July with urologist.    Past Medical History:  Diagnosis Date   Anxiety    aniety attacks   Bladder tumor    Colon polyps    COPD (chronic obstructive pulmonary disease) ([place])    09-08-2020 acute flare up with cough finished antibiotics and steroids cough resolved now   Depression    Hay fever    allergies   History of kidney stones    Migraines    not in 40 years   Wears glasses     Past Surgical History:  Procedure Laterality Date   Magazine   caesarean secion  1974, 1977, 1984   [place]  [phone]   COLONOSCOPY W/ POLYPECTOMY  last done 2016    x  3    CYSTOSCOPY N/A 09/21/2020   Procedure: CYSTOSCOPY;  Surgeon: Robley Fries, MD;  Location: [facility];  Service: Urology;  Laterality: N/A;   CYSTOSCOPY W/ RETROGRADES Bilateral 02/04/2020   Procedure: CYSTOSCOPY WITH RETROGRADE PYELOGRAM;  Surgeon: Lucas Mallow, MD;  Location[facility];  Service: Urology;  Laterality: Bilateral;   OPEN REDUCTION INTERNAL FIXATION (ORIF) DISTAL RADIAL FRACTURE Right 11/03/2014   Procedure: OPEN REDUCTION INTERNAL FIXATION (ORIF) RIGHT DISTAL RADIAL FRACTURE WITH REPAIR AND RECONSTRUCTION;  Surgeon: Roseanne Kaufman, MD;  Location: [place];  Service: Orthopedics;  Laterality: Right;   TRANSURETHRAL RESECTION OF BLADDER TUMOR WITH MITOMYCIN-C N/A 02/04/2020   Procedure: TRANSURETHRAL RESECTION OF BLADDER TUMOR WITH POST OPERATIVE GEMCITABINE;  Surgeon: Lucas Mallow, MD;  Location: [facility];  Service: Urology;  Laterality: N/A;   TRANSURETHRAL RESECTION OF BLADDER TUMOR WITH MITOMYCIN-C N/A 09/21/2020   Procedure: CYSTOSCOPY WITH BLADDER FULGARATION WITH POST OP GEMCITABINE;  Surgeon: Robley Fries, MD;  Location: [place];  Service: Urology;  Laterality: N/A;  1 HR    Family History  Problem Relation Age of Onset   Arthritis Other    Stroke Other    Hypertension Other    Depression Other    Migraines Other    Colon polyps Other  Breast cancer Neg Hx     Social History   Socioeconomic History   Marital status: Married    Spouse name: Not on file   Number of children: 3   Years of education: 13   Highest education level: Some college, no degree  Occupational History    Comment: Retired   Tobacco Use   Smoking status: Former    Packs/day: 1.50    Years: 50.00    Pack years: 75.00    Types: Cigarettes    Quit date: 2012    Years since quitting: 10.4   Smokeless tobacco: Never   Tobacco comments:    Former smoker  Scientific laboratory technician Use: Never used   Substance and Sexual Activity   Alcohol use: Yes    Alcohol/week: 2.0 standard drinks    Types: 2 Glasses of wine per week    Comment: 2 glasses of wine daily   Drug use: No   Sexual activity: Not on file  Other Topics Concern   Not on file  Social History Narrative   Married   3 children   Social Determinants of Health   Financial Resource Strain: Low Risk    Difficulty of Paying Living Expenses: Not hard at all  Food Insecurity: No Food Insecurity   Worried About Charity fundraiser in the Last Year: Never true   Arboriculturist in the Last Year: Never true  Transportation Needs: No Transportation Needs   Lack of Transportation (Medical): No   Lack of Transportation (Non-Medical): No  Physical Activity: Insufficiently Active   Days of Exercise per Week: 3 days   Minutes of Exercise per Session: 40 min  Stress: No Stress Concern Present   Feeling of Stress : Not at all  Social Connections: Moderately Isolated   Frequency of Communication with Friends and Family: More than three times a week   Frequency of Social Gatherings with Friends and Family: More than three times a week   Attends Religious Services: Never   Marine scientist or Organizations: No   Attends Music therapist: Never   Marital Status: Married  Human resources officer Violence: Not At Risk   Fear of Current or Ex-Partner: No   Emotionally Abused: No   Physically Abused: No   Sexually Abused: No    Outpatient Medications Prior to Visit  Medication Sig Dispense Refill   albuterol (VENTOLIN HFA) 108 (90 Base) MCG/ACT inhaler Inhale 2 puffs into the lungs every 6 (six) hours as needed for wheezing or shortness of breath. 8 g 2   chlorpheniramine (CHLOR-TRIMETON) 4 MG tablet Take 4 mg by mouth 2 (two) times daily as needed for allergies.     FLUoxetine (PROZAC) 20 MG capsule TAKE 1 CAPSULE BY MOUTH EVERY DAY 90 capsule 3   Multiple Vitamin (MULTIVITAMIN) capsule Take 1 capsule by mouth daily.      amoxicillin-clavulanate (AUGMENTIN) 875-125 MG tablet Take 1 tablet by mouth 2 (two) times daily. For copd exacerbation     predniSONE (DELTASONE) 20 MG tablet Take 20 mg by mouth daily with breakfast. 20 mg 2 tabs dailyX 5 days started 09-07-2020     traMADol (ULTRAM) 50 MG tablet Take 1 tablet (50 mg total) by mouth every 6 (six) hours as needed. 20 tablet 0   No facility-administered medications prior to visit.    Allergies  Allergen Reactions   Aspirin     REACTION: vasculitis    ROS Review of Systems  Constitutional:  Negative for fatigue.  Eyes:  Negative for visual disturbance.  Respiratory:  Negative for cough, chest tightness, shortness of breath and wheezing.   Cardiovascular:  Negative for chest pain, palpitations and leg swelling.  Genitourinary:  Negative for dysuria and hematuria.  Neurological:  Negative for dizziness, seizures, syncope, weakness, light-headedness and headaches.     Objective:    Physical Exam Constitutional:      Appearance: She is well-developed.  Eyes:     Pupils: Pupils are equal, round, and reactive to light.  Neck:     Thyroid: No thyromegaly.     Vascular: No JVD.  Cardiovascular:     Rate and Rhythm: Normal rate and regular rhythm.     Heart sounds:    No gallop.  Pulmonary:     Effort: Pulmonary effort is normal. No respiratory distress.     Breath sounds: Normal breath sounds. No wheezing or rales.  Neurological:     Mental Status: She is alert.    BP 110/62 (BP Location: Left Arm, Patient Position: Sitting, Cuff Size: Normal)   Pulse (!) 55   Temp (!) 97.5 F (36.4 C) (Oral)   Wt 166 lb 3.2 oz (75.4 kg)   SpO2 95%   BMI 26.03 kg/m  Wt Readings from Last 3 Encounters:  12/14/20 166 lb 3.2 oz (75.4 kg)  09/21/20 168 lb 4.8 oz (76.3 kg)  09/13/20 171 lb (77.6 kg)     Health Maintenance Due  Topic Date Due   COVID-19 Vaccine (4 - Booster for Pfizer series) 05/18/2020   PNA vac Low Risk Adult (2 of 2 - PPSV23)  08/31/2020    There are no preventive care reminders to display for this patient.  Lab Results  Component Value Date   TSH 0.89 08/30/2020   Lab Results  Component Value Date   WBC 5.2 11/03/2014   HGB 13.4 11/03/2014   HCT 41.0 11/03/2014   MCV 94.3 11/03/2014   PLT 268 11/03/2014   Lab Results  Component Value Date   NA 141 08/30/2020   K 3.7 08/30/2020   CO2 29 08/30/2020   GLUCOSE 94 08/30/2020   BUN 22 08/30/2020   CREATININE 0.99 08/30/2020   BILITOT 0.8 06/06/2012   ALKPHOS 62 06/06/2012   AST 18 06/06/2012   ALT 16 06/06/2012   PROT 7.0 06/06/2012   ALBUMIN 4.0 06/06/2012   CALCIUM 9.4 08/30/2020   GFR 55.74 (L) 08/30/2020   Lab Results  Component Value Date   CHOL 218 (H) 06/06/2012   Lab Results  Component Value Date   HDL 70.30 06/06/2012   Lab Results  Component Value Date   LDLCALC 91 05/04/2010   Lab Results  Component Value Date   TRIG 64.0 06/06/2012   Lab Results  Component Value Date   CHOLHDL 3 06/06/2012   No results found for: HGBA1C    Assessment & Plan:   #1 osteoporosis.  Recent DEXA scan results reviewed with patient.  She was treated for over 10 years with Fosamax and this was discontinued 2011.  We went back and compared her DEXA scan results with 2011 and her bone density at the hip is only declined from -2.5 to -2.7 over that time.  -We had a long discussion regarding various osteoporosis therapies.  We gave her the option of going back on Fosamax since she has been off this for 11 years now but at this point she is not sure she wants to do that.  She would like to consider getting through her bladder cancer treatments first. -We also discussed other therapies such as Prolia but would not see any clear benefit of starting those since she tolerated Fosamax well -Did stress importance of regular weightbearing along with daily calcium 1200 mg and vitamin D 800 to 1000 international units  #2 probable COPD based on prior  imaging.  She has not had pulmonary function test.  We discussed pros and cons of PFTs that she declines at this time.  She is not symptomatic in terms of dyspnea or chronic cough.  We did discuss gold guidelines for managing COPD and how severity can help guide therapy but at this point she declines. -Strongly encourage annual flu vaccine.  Her pneumonia vaccines have been completed.  We spent 30 minutes reviewing recent DEXA scan, previous lung imaging, reviewing medication options, discussing nonpharmacologic measures to reduce risk of fracture   No orders of the defined types were placed in this encounter.   Follow-up: No follow-ups on file.    Carolann Littler, MD

## 2021-01-03 DIAGNOSIS — Z23 Encounter for immunization: Secondary | ICD-10-CM | POA: Diagnosis not present

## 2021-01-05 ENCOUNTER — Encounter: Payer: Self-pay | Admitting: Family Medicine

## 2021-01-05 MED ORDER — ZOLPIDEM TARTRATE 5 MG PO TABS: 5.0000 mg | ORAL_TABLET | Freq: Every evening | ORAL | 0 refills | Status: DC | PRN

## 2021-01-05 NOTE — Telephone Encounter (Signed)
Looks like she took Ambien in the past.  Confirmed that she took this without difficulty.  I will send in low-dose Ambien 5 mg to take 1 nightly as needed short-term only

## 2021-01-13 DIAGNOSIS — C678 Malignant neoplasm of overlapping sites of bladder: Secondary | ICD-10-CM | POA: Diagnosis not present

## 2021-02-24 DIAGNOSIS — Z23 Encounter for immunization: Secondary | ICD-10-CM | POA: Diagnosis not present

## 2021-03-01 DIAGNOSIS — H524 Presbyopia: Secondary | ICD-10-CM | POA: Diagnosis not present

## 2021-03-01 DIAGNOSIS — H5213 Myopia, bilateral: Secondary | ICD-10-CM | POA: Diagnosis not present

## 2021-03-01 DIAGNOSIS — H43813 Vitreous degeneration, bilateral: Secondary | ICD-10-CM | POA: Diagnosis not present

## 2021-03-01 DIAGNOSIS — H25813 Combined forms of age-related cataract, bilateral: Secondary | ICD-10-CM | POA: Diagnosis not present

## 2021-03-04 ENCOUNTER — Other Ambulatory Visit: Payer: Self-pay

## 2021-03-04 ENCOUNTER — Ambulatory Visit (INDEPENDENT_AMBULATORY_CARE_PROVIDER_SITE_OTHER): Payer: Medicare Other | Admitting: Family Medicine

## 2021-03-04 VITALS — BP 120/70 | HR 65 | Temp 98.2°F | Wt 165.8 lb

## 2021-03-04 DIAGNOSIS — R059 Cough, unspecified: Secondary | ICD-10-CM | POA: Diagnosis not present

## 2021-03-04 MED ORDER — PREDNISONE 20 MG PO TABS: ORAL_TABLET | ORAL | 0 refills | Status: DC

## 2021-03-04 MED ORDER — DOXYCYCLINE HYCLATE 100 MG PO CAPS: 100.0000 mg | ORAL_CAPSULE | Freq: Two times a day (BID) | ORAL | 0 refills | Status: DC

## 2021-03-04 NOTE — Progress Notes (Signed)
Established Patient Office Visit  Subjective:  Patient ID: [name], female    DOB: [birth year]  Age: 75 y.o. MRN: [id number]  CC:  Chief Complaint  Patient presents with   Cough    X 10 days, chest congestion, drainage, feels like mucus is caught in her throat but wont come up    HPI [name] presents for dry cough for the past 10 days.  Occasional loose mucus but mostly dry.  She does not have any sore throat, headaches, fever, postnasal drainage, hemoptysis, or increased dyspnea.  She does have long history of smoking.  She quit smoking 2012.  She does have evidence for COPD from prior imaging.  She has albuterol inhaler which she has used couple times with minimal improvement.  No significant dyspnea.  She has had difficulty shaking bronchial infections in the past.  Appetite and weight stable.  No recent sick contacts.  Past Medical History:  Diagnosis Date   Anxiety    aniety attacks   Bladder tumor    Colon polyps    COPD (chronic obstructive pulmonary disease) ([place])    09-08-2020 acute flare up with cough finished antibiotics and steroids cough resolved now   Depression    Hay fever    allergies   History of kidney stones    Migraines    not in 40 years   Wears glasses     Past Surgical History:  Procedure Laterality Date   [place]   caesarean secion  1974, 1977, 1984   [place]  [phone]   COLONOSCOPY W/ POLYPECTOMY  last done 2016    x 3    CYSTOSCOPY N/A 09/21/2020   Procedure: CYSTOSCOPY;  Surgeon: Robley Fries, MD;  Location: [facility];  Service: Urology;  Laterality: N/A;   CYSTOSCOPY W/ RETROGRADES Bilateral 02/04/2020   Procedure: CYSTOSCOPY WITH RETROGRADE PYELOGRAM;  Surgeon: Lucas Mallow, MD;  Location: [facility];  Service: Urology;  Laterality: Bilateral;   OPEN REDUCTION INTERNAL FIXATION (ORIF) DISTAL RADIAL FRACTURE Right 11/03/2014   Procedure: OPEN REDUCTION  INTERNAL FIXATION (ORIF) RIGHT DISTAL RADIAL FRACTURE WITH REPAIR AND RECONSTRUCTION;  Surgeon: Roseanne Kaufman, MD;  Location: [place];  Service: Orthopedics;  Laterality: Right;   TRANSURETHRAL RESECTION OF BLADDER TUMOR WITH MITOMYCIN-C N/A 02/04/2020   Procedure: TRANSURETHRAL RESECTION OF BLADDER TUMOR WITH POST OPERATIVE GEMCITABINE;  Surgeon: Lucas Mallow, MD;  Location: [facility];  Service: Urology;  Laterality: N/A;   TRANSURETHRAL RESECTION OF BLADDER TUMOR WITH MITOMYCIN-C N/A 09/21/2020   Procedure: CYSTOSCOPY WITH BLADDER FULGARATION WITH POST OP GEMCITABINE;  Surgeon: Robley Fries, MD;  Location: [place];  Service: Urology;  Laterality: N/A;  1 HR    Family History  Problem Relation Age of Onset   Arthritis Other    Stroke Other    Hypertension Other    Depression Other    Migraines Other    Colon polyps Other    Breast cancer Neg Hx     Social History   Socioeconomic History   Marital status: Married    Spouse name: Not on file   Number of children: 3   Years of education: 13   Highest education level: Some college, no degree  Occupational History    Comment: Retired   Tobacco Use   Smoking status: Former    Packs/day: 1.50    Years: 50.00    Pack years:  75.00    Types: Cigarettes    Quit date: 2012    Years since quitting: 10.7   Smokeless tobacco: Never   Tobacco comments:    Former smoker  Scientific laboratory technician Use: Never used  Substance and Sexual Activity   Alcohol use: Yes    Alcohol/week: 2.0 standard drinks    Types: 2 Glasses of wine per week    Comment: 2 glasses of wine daily   Drug use: No   Sexual activity: Not on file  Other Topics Concern   Not on file  Social History Narrative   Married   3 children   Social Determinants of Health   Financial Resource Strain: Low Risk    Difficulty of Paying Living Expenses: Not hard at all  Food Insecurity: No Food Insecurity   Worried About Ship broker in the Last Year: Never true   Arboriculturist in the Last Year: Never true  Transportation Needs: No Transportation Needs   Lack of Transportation (Medical): No   Lack of Transportation (Non-Medical): No  Physical Activity: Insufficiently Active   Days of Exercise per Week: 3 days   Minutes of Exercise per Session: 40 min  Stress: No Stress Concern Present   Feeling of Stress : Not at all  Social Connections: Moderately Isolated   Frequency of Communication with Friends and Family: More than three times a week   Frequency of Social Gatherings with Friends and Family: More than three times a week   Attends Religious Services: Never   Marine scientist or Organizations: No   Attends Music therapist: Never   Marital Status: Married  Human resources officer Violence: Not At Risk   Fear of Current or Ex-Partner: No   Emotionally Abused: No   Physically Abused: No   Sexually Abused: No    Outpatient Medications Prior to Visit  Medication Sig Dispense Refill   albuterol (VENTOLIN HFA) 108 (90 Base) MCG/ACT inhaler Inhale 2 puffs into the lungs every 6 (six) hours as needed for wheezing or shortness of breath. 8 g 2   chlorpheniramine (CHLOR-TRIMETON) 4 MG tablet Take 4 mg by mouth 2 (two) times daily as needed for allergies.     FLUoxetine (PROZAC) 20 MG capsule TAKE 1 CAPSULE BY MOUTH EVERY DAY 90 capsule 3   Multiple Vitamin (MULTIVITAMIN) capsule Take 1 capsule by mouth daily.     zolpidem (AMBIEN) 5 MG tablet Take 1 tablet (5 mg total) by mouth at bedtime as needed for sleep. 15 tablet 0   No facility-administered medications prior to visit.    Allergies  Allergen Reactions   Aspirin     REACTION: vasculitis    ROS Review of Systems  Constitutional:  Negative for chills, fatigue and fever.  HENT:  Negative for sinus pressure.   Respiratory:  Positive for cough. Negative for shortness of breath.   Cardiovascular:  Negative for chest pain.  Neurological:   Negative for headaches.     Objective:    Physical Exam Vitals reviewed.  Constitutional:      Appearance: Normal appearance.  Cardiovascular:     Rate and Rhythm: Normal rate and regular rhythm.  Pulmonary:     Effort: Pulmonary effort is normal.     Comments: No rales.  Few faint wheezes heard right lung field and this seemed to clear somewhat with deep breathing. Musculoskeletal:     Cervical back: Neck supple.  Lymphadenopathy:  Cervical: No cervical adenopathy.  Neurological:     Mental Status: She is alert.    BP 120/70 (BP Location: Left Arm, Patient Position: Sitting, Cuff Size: Normal)   Pulse 65   Temp 98.2 F (36.8 C) (Oral)   Wt 165 lb 12.8 oz (75.2 kg)   SpO2 93%   BMI 25.97 kg/m  Wt Readings from Last 3 Encounters:  03/04/21 165 lb 12.8 oz (75.2 kg)  12/14/20 166 lb 3.2 oz (75.4 kg)  09/21/20 168 lb 4.8 oz (76.3 kg)     Health Maintenance Due  Topic Date Due   COVID-19 Vaccine (4 - Booster for Pfizer series) 05/10/2020   PNA vac Low Risk Adult (2 of 2 - PPSV23) 08/31/2020   INFLUENZA VACCINE  01/17/2021    There are no preventive care reminders to display for this patient.  Lab Results  Component Value Date   TSH 0.89 08/30/2020   Lab Results  Component Value Date   WBC 5.2 11/03/2014   HGB 13.4 11/03/2014   HCT 41.0 11/03/2014   MCV 94.3 11/03/2014   PLT 268 11/03/2014   Lab Results  Component Value Date   NA 141 08/30/2020   K 3.7 08/30/2020   CO2 29 08/30/2020   GLUCOSE 94 08/30/2020   BUN 22 08/30/2020   CREATININE 0.99 08/30/2020   BILITOT 0.8 06/06/2012   ALKPHOS 62 06/06/2012   AST 18 06/06/2012   ALT 16 06/06/2012   PROT 7.0 06/06/2012   ALBUMIN 4.0 06/06/2012   CALCIUM 9.4 08/30/2020   GFR 55.74 (L) 08/30/2020   Lab Results  Component Value Date   CHOL 218 (H) 06/06/2012   Lab Results  Component Value Date   HDL 70.30 06/06/2012   Lab Results  Component Value Date   LDLCALC 91 05/04/2010   Lab Results   Component Value Date   TRIG 64.0 06/06/2012   Lab Results  Component Value Date   CHOLHDL 3 06/06/2012   No results found for: HGBA1C    Assessment & Plan:   Patient presents with 10-day history of cough which is mostly dry and occasionally productive.  History of COPD  -Consider over-the-counter Mucinex -Follow-up immediately for any fever or increased shortness of breath -We elected to go ahead and cover with prednisone 20 mg 2 tablets daily for 5 days and doxycycline 100 mg twice daily for 7 days -We again discussed possible PFTs to help further classify her COPD but she declines at this time. -Continue albuterol inhaler as needed  Meds ordered this encounter  Medications   doxycycline (VIBRAMYCIN) 100 MG capsule    Sig: Take 1 capsule (100 mg total) by mouth 2 (two) times daily.    Dispense:  20 capsule    Refill:  0   predniSONE (DELTASONE) 20 MG tablet    Sig: Take two tablets once daily for 5 days.    Dispense:  10 tablet    Refill:  0    Follow-up: No follow-ups on file.    Carolann Littler, MD

## 2021-03-14 ENCOUNTER — Encounter: Payer: Self-pay | Admitting: Family Medicine

## 2021-03-15 ENCOUNTER — Encounter: Payer: Self-pay | Admitting: Family Medicine

## 2021-03-15 ENCOUNTER — Telehealth (INDEPENDENT_AMBULATORY_CARE_PROVIDER_SITE_OTHER): Payer: Medicare Other | Admitting: Family Medicine

## 2021-03-15 VITALS — Temp 98.1°F

## 2021-03-15 DIAGNOSIS — U071 COVID-19: Secondary | ICD-10-CM

## 2021-03-15 MED ORDER — BENZONATATE 100 MG PO CAPS: 100.0000 mg | ORAL_CAPSULE | Freq: Three times a day (TID) | ORAL | 0 refills | Status: DC | PRN

## 2021-03-15 MED ORDER — MOLNUPIRAVIR EUA 200MG CAPSULE: 4.0000 | ORAL_CAPSULE | Freq: Two times a day (BID) | ORAL | 0 refills | Status: AC

## 2021-03-15 NOTE — Progress Notes (Signed)
Virtual Visit via Video Note  I connected with [name]  on 03/15/21 at  4:40 PM EDT by a video enabled telemedicine application and verified that I am speaking with the correct person using two identifiers.  Location patient: home, [place] Location provider:work or home office Persons participating in the virtual visit: patient, provider  I discussed the limitations of evaluation and management by telemedicine and the availability of in person appointments. The patient expressed understanding and agreed to proceed.   HPI:  Acute telemedicine visit for Covid19: -Onset: 2 days ago -Symptoms include: nasal congestion, chills, cough -Denies: fever, CP, SOB, NVD, inability to eat/drink/get out bed -Pertinent past medical history: see below -Pertinent medication allergies:  Allergies  Allergen Reactions   Aspirin     REACTION: vasculitis  -COVID-19 vaccine status:2 doses and 2 booster -GFR had dropped to 55 last lab check over 6 months ago  ROS: See pertinent positives and negatives per HPI.  Past Medical History:  Diagnosis Date   Anxiety    aniety attacks   Bladder tumor    Colon polyps    COPD (chronic obstructive pulmonary disease) ([place])    09-08-2020 acute flare up with cough finished antibiotics and steroids cough resolved now   Depression    Hay fever    allergies   History of kidney stones    Migraines    not in 40 years   Wears glasses     Past Surgical History:  Procedure Laterality Date   [place]   caesarean secion  1974, 1977, 1984   [place]  [phone]   COLONOSCOPY W/ POLYPECTOMY  last done 2016    x 3    CYSTOSCOPY N/A 09/21/2020   Procedure: CYSTOSCOPY;  Surgeon: Robley Fries, MD;  Location: [facility];  Service: Urology;  Laterality: N/A;   CYSTOSCOPY W/ RETROGRADES Bilateral 02/04/2020   Procedure: CYSTOSCOPY WITH RETROGRADE PYELOGRAM;  Surgeon: Lucas Mallow, MD;  Location: [facility];  Service:  Urology;  Laterality: Bilateral;   OPEN REDUCTION INTERNAL FIXATION (ORIF) DISTAL RADIAL FRACTURE Right 11/03/2014   Procedure: OPEN REDUCTION INTERNAL FIXATION (ORIF) RIGHT DISTAL RADIAL FRACTURE WITH REPAIR AND RECONSTRUCTION;  Surgeon: Roseanne Kaufman, MD;  Location: [place];  Service: Orthopedics;  Laterality: Right;   TRANSURETHRAL RESECTION OF BLADDER TUMOR WITH MITOMYCIN-C N/A 02/04/2020   Procedure: TRANSURETHRAL RESECTION OF BLADDER TUMOR WITH POST OPERATIVE GEMCITABINE;  Surgeon: Lucas Mallow, MD;  Location: [facility];  Service: Urology;  Laterality: N/A;   TRANSURETHRAL RESECTION OF BLADDER TUMOR WITH MITOMYCIN-C N/A 09/21/2020   Procedure: CYSTOSCOPY WITH BLADDER FULGARATION WITH POST OP GEMCITABINE;  Surgeon: Robley Fries, MD;  Location: [place];  Service: Urology;  Laterality: N/A;  1 HR     Current Outpatient Medications:    albuterol (VENTOLIN HFA) 108 (90 Base) MCG/ACT inhaler, Inhale 2 puffs into the lungs every 6 (six) hours as needed for wheezing or shortness of breath., Disp: 8 g, Rfl: 2   benzonatate (TESSALON PERLES) 100 MG capsule, Take 1 capsule (100 mg total) by mouth 3 (three) times daily as needed., Disp: 20 capsule, Rfl: 0   FLUoxetine (PROZAC) 20 MG capsule, TAKE 1 CAPSULE BY MOUTH EVERY DAY, Disp: 90 capsule, Rfl: 3   molnupiravir EUA (LAGEVRIO) 200 mg CAPS capsule, Take 4 capsules (800 mg total) by mouth 2 (two) times daily for 5 days., Disp: 40 capsule, Rfl: 0   Multiple Vitamin (MULTIVITAMIN)  capsule, Take 1 capsule by mouth daily., Disp: , Rfl:   EXAM:  VITALS per patient if applicable:  GENERAL: alert, oriented, appears well and in no acute distress  HEENT: atraumatic, conjunttiva clear, no obvious abnormalities on inspection of external nose and ears  NECK: normal movements of the head and neck  LUNGS: on inspection no signs of respiratory distress, breathing rate appears normal, no obvious gross SOB, gasping or  wheezing  CV: no obvious cyanosis  MS: moves all visible extremities without noticeable abnormality  PSYCH/NEURO: pleasant and cooperative, no obvious depression or anxiety, speech and thought processing grossly intact  ASSESSMENT AND PLAN:  Discussed the following assessment and plan:  COVID-19   Discussed treatment options (infusions and oral options and risk of drug interactions), ideal treatment window, potential complications, isolation and precautions for COVID-19.  Discussed possibility of rebound with antivirals and the need to reisolate if it should occur for 5 days. Checked for/reviewed any labs done in the last 90 days with GFR listed in HPI if available. After lengthy discussion, the patient opted for treatment with monupiravir due to being higher risk for complications of covid or severe disease and other factors. Discussed EUA status of this drug and the fact that there is preliminary limited knowledge of risks/interactions/side effects per EUA document vs possible benefits and precautions. This information was shared with patient during the visit and also was provided in patient instructions. Also, advised that patient discuss risks/interactions and use with pharmacist/treatment team as well.  The patient did want a prescription for cough, Tessalon Rx sent. She asked about vitamins as well - see pt instructions.  Other symptomatic care measures summarized in patient instructions.  Advised to seek prompt in person care if worsening, new symptoms arise, or if is not improving with treatment. Discussed options for inperson care if PCP office not available. Did let this patient know that I only do telemedicine on Tuesdays and Thursdays for [place]. Advised to schedule follow up visit with PCP or [facility] if any further questions or concerns to avoid delays in care.   I discussed the assessment and treatment plan with the patient. The patient was provided an opportunity to ask questions and  all were answered. The patient agreed with the plan and demonstrated an understanding of the instructions.     [name], DO

## 2021-03-15 NOTE — Patient Instructions (Addendum)
HOME CARE TIPS:  -I sent the medication(s) we discussed to your pharmacy: No orders of the defined types were placed in this encounter.    -I sent in the [place] treatment or referral you requested per our discussion. Please see the information provided below and discuss further with the pharmacist/treatment team.   -there is a chance of rebound illness after finishing your treatment. If you become sick again please isolate for an additional 5 days.    -can use tylenol if needed for fevers, aches and pains per instructions  -can use nasal saline a few times per day if you have nasal congestion  -stay hydrated, drink plenty of fluids and eat small healthy meals - avoid dairy  -can take 1000 IU (41mg) Vit D3 and 100-500 mg of Vit C daily per instructions  -follow up with your doctor in 2-3 days unless improving and feeling better  -stay home while sick, except to seek medical care. If you have COVID19, ideally it would be best to stay home for a full 10 days since the onset of symptoms PLUS one day of no fever and feeling better. Wear a good mask that fits snugly (such as N95 or KN95) if around others to reduce the risk of transmission.  It was nice to meet you today, and I really hope you are feeling better soon. I help [facility] out with telemedicine visits on Tuesdays and Thursdays and am available for visits on those days. If you have any concerns or questions following this visit please schedule a follow up visit with your Primary Care doctor or seek care at a local urgent care clinic to avoid delays in care.    Seek in person care or schedule a follow up video visit promptly if your symptoms worsen, new concerns arise or you are not improving with treatment. Call 911 and/or seek emergency care if your symptoms are severe or life threatening.    Fact Sheet for Patients And Caregivers Emergency Use Authorization (EUA) Of LAGEVRIOT (molnupiravir) capsules For Coronavirus Disease  2019 (COVID-19)  What is the most important information I should know about LAGEVRIO? LAGEVRIO may cause serious side effects, including: ? LAGEVRIO may cause harm to your unborn baby. It is not known if LAGEVRIO will harm your baby if you take LAGEVRIO during pregnancy. o LAGEVRIO is not recommended for use in pregnancy. o LAGEVRIO has not been studied in pregnancy. LAGEVRIO was studied in pregnant animals only. When LAGEVRIO was given to pregnant animals, LAGEVRIO caused harm to their unborn babies. o You and your healthcare provider may decide that you should take LAGEVRIO during pregnancy if there are no other COVID-19 treatment options approved or authorized by the FDA that are accessible or clinically appropriate for you. o If you and your healthcare provider decide that you should take LAGEVRIO during pregnancy, you and your healthcare provider should discuss the known and potential benefits and the potential risks of taking LAGEVRIO during pregnancy. For individuals who are able to become pregnant: ? You should use a reliable method of birth control (contraception) consistently and correctly during treatment with LAGEVRIO and for 4 days after the last dose of LAGEVRIO. Talk to your healthcare provider about reliable birth control methods. ? [facility] may do a pregnancy test to see if you are pregnant before starting treatment with LAGEVRIO. ? Tell your healthcare provider right away if you become pregnant or think you may be pregnant during treatment with LAGEVRIO.  Pregnancy Surveillance Program: ? There is a pregnancy surveillance program for individuals who take LAGEVRIO during pregnancy. The purpose of this program is to collect information about the health of you and your baby. Talk to your healthcare provider about how to take part in this program. ? If you take LAGEVRIO during pregnancy and you agree to participate in  the pregnancy surveillance program and allow your healthcare provider to share your information with [place], then your healthcare provider will report your use of [place] during pregnancy to [place]. by calling [phone] or PeacefulBlog.es. For individuals who are sexually active with partners who are able to become pregnant: ? It is not known if LAGEVRIO can affect sperm. While the risk is regarded as low, animal studies to fully assess the potential for LAGEVRIO to affect the babies of males treated with LAGEVRIO have not been completed. A reliable method of birth control (contraception) should be used consistently and correctly during treatment with LAGEVRIO and for at least 3 months after the last dose. The risk to sperm beyond 3 months is not known. Studies to understand the risk to sperm beyond 3 months are ongoing. Talk to your healthcare provider about reliable birth control methods. Talk to your healthcare provider if you have questions or concerns about how LAGEVRIO may affect sperm. You are being given this fact sheet because your healthcare provider believes it is necessary to provide you with LAGEVRIO for the treatment of adults with mild-to-moderate coronavirus disease 2019 (COVID-19) with positive results of direct SARS-CoV-2 viral testing, and who are at high risk for progression to severe COVID-19 including hospitalization or death, and for whom other COVID-19 treatment options approved or authorized by the FDA are not accessible or clinically appropriate. The U.S. Food and Drug Administration (FDA) has issued an Emergency Use Authorization (EUA) to make LAGEVRIO available during the COVID-19 pandemic (for more details about an EUA please see "What is an Emergency Use Authorization?" at the end of this document). LAGEVRIO is not an FDA-approved medicine in the [place]. Read this Fact Sheet for information about LAGEVRIO.  Talk to your healthcare provider about your options if you have any questions. It is your choice to take LAGEVRIO.  What is COVID-19? COVID-19 is caused by a virus called a coronavirus. You can get COVID-19 through close contact with another person who has the virus. COVID-19 illnesses have ranged from very mild-to-severe, including illness resulting in death. While information so far suggests that most COVID-19 illness is mild, serious illness can happen and may cause some of your other medical conditions to become worse. Older people and people of all ages with severe, long lasting (chronic) medical conditions like heart disease, lung disease and diabetes, for example seem to be at higher risk of being hospitalized for COVID-19.  What is LAGEVRIO? LAGEVRIO is an investigational medicine used to treat mild-to-moderate COVID-19 in adults: ? with positive results of direct SARS-CoV-2 viral testing, and ? who are at high risk for progression to severe COVID-19 including hospitalization or death, and for whom other COVID-19 treatment options approved or authorized by the FDA are not accessible or clinically appropriate. The FDA has authorized the emergency use of LAGEVRIO for the treatment of mild-tomoderate COVID-19 in adults under an EUA. For more information on EUA, see the "What is an Emergency Use Authorization (EUA)?" section at the end of this Fact Sheet. LAGEVRIO is not authorized: ? for use in people less than 18 years  of age. ? for prevention of COVID-19. ? for people needing hospitalization for COVID-19. ? for use for longer than 5 consecutive days.  What should I tell my healthcare provider before I take LAGEVRIO? Tell your healthcare provider if you: ? Have any allergies ? Are breastfeeding or plan to breastfeed ? Have any serious illnesses ? Are taking any medicines (prescription, over-the-counter, vitamins, or herbal products).  How do I take LAGEVRIO? ? Take LAGEVRIO  exactly as your healthcare provider tells you to take it. ? Take 4 capsules of LAGEVRIO every 12 hours (for example, at 8 am and at 8 pm) ? Take LAGEVRIO for 5 days. It is important that you complete the full 5 days of treatment with LAGEVRIO. Do not stop taking LAGEVRIO before you complete the full 5 days of treatment, even if you feel better. ? Take LAGEVRIO with or without food. ? You should stay in isolation for as long as your healthcare provider tells you to. Talk to your healthcare provider if you are not sure about how to properly isolate while you have COVID-19. ? Swallow LAGEVRIO capsules whole. Do not open, break, or crush the capsules. If you cannot swallow capsules whole, tell your healthcare provider. ? What to do if you miss a dose: o If it has been less than 10 hours since the missed dose, take it as soon as you remember o If it has been more than 10 hours since the missed dose, skip the missed dose and take your dose at the next scheduled time. ? Do not double the dose of LAGEVRIO to make up for a missed dose.  What are the important possible side effects of LAGEVRIO? ? See, "What is the most important information I should know about LAGEVRIO?" ? Allergic Reactions. Allergic reactions can happen in people taking LAGEVRIO, even after only 1 dose. Stop taking LAGEVRIO and call your healthcare provider right away if you get any of the following symptoms of an allergic reaction: o hives o rapid heartbeat o trouble swallowing or breathing o swelling of the mouth, lips, or face o throat tightness o hoarseness o skin rash The most common side effects of LAGEVRIO are: ? diarrhea ? nausea ? dizziness These are not all the possible side effects of LAGEVRIO. Not many people have taken LAGEVRIO. Serious and unexpected side effects may happen. This medicine is still being studied, so it is possible that all of the risks are not known at this time.  What other treatment  choices are there?  Veklury (remdesivir) is FDA-approved as an intravenous (IV) infusion for the treatment of mildto-moderate ZLDJT-70 in certain adults and children. Talk with your doctor to see if Marijean Heath is appropriate for you. Like LAGEVRIO, FDA may also allow for the emergency use of other medicines to treat people with COVID-19. Go to LacrosseProperties.si for more information. It is your choice to be treated or not to be treated with LAGEVRIO. Should you decide not to take it, it will not change your standard medical care.  What if I am breastfeeding? Breastfeeding is not recommended during treatment with LAGEVRIO and for 4 days after the last dose of LAGEVRIO. If you are breastfeeding or plan to breastfeed, talk to your healthcare provider about your options and specific situation before taking LAGEVRIO.  How do I report side effects with LAGEVRIO? Contact your healthcare provider if you have any side effects that bother you or do not go away. Report side effects to FDA MedWatch at SmoothHits.hu  or call 1-800-FDA-1088 (1- (541)599-8835).  How should I store [place]? ? Store LAGEVRIO capsules at room temperature between 43F to 72F (20C to 25C). ? Keep LAGEVRIO and all medicines out of the reach of children and pets. How can I learn more about COVID-19? ? Ask your healthcare provider. ? Visit SeekRooms.co.uk ? Contact your local or state public health department. ? Call [place] at [phone] (toll free in the [place] ? Visit www.molnupiravir.com  What Is an Emergency Use Authorization (EUA)? The [place] FDA has made [place] available under an emergency access mechanism called an Emergency Use Authorization (EUA) The EUA is supported by a Presenter, broadcasting Health and Human Service (HHS) declaration that circumstances exist to justify emergency use  of drugs and biological products during the COVID-19 pandemic. LAGEVRIO for the treatment of mild-to-moderate COVID-19 in adults with positive results of direct SARS-CoV-2 viral testing, who are at high risk for progression to severe COVID-19, including hospitalization or death, and for whom alternative COVID-19 treatment options approved or authorized by FDA are not accessible or clinically appropriate, has not undergone the same type of review as an FDA-approved product. In issuing an EUA under the OMVEH-20 public health emergency, the FDA has determined, among other things, that based on the total amount of scientific evidence available including data from adequate and well-controlled clinical trials, if available, it is reasonable to believe that the product may be effective for diagnosing, treating, or preventing COVID-19, or a serious or life-threatening disease or condition caused by COVID-19; that the known and potential benefits of the product, when used to diagnose, treat, or prevent such disease or condition, outweigh the known and potential risks of such product; and that there are no adequate, approved, and available alternatives.  All of these criteria must be met to allow for the product to be used in the treatment of patients during the COVID-19 pandemic. The EUA for LAGEVRIO is in effect for the duration of the COVID-19 declaration justifying emergency use of LAGEVRIO, unless terminated or revoked (after which LAGEVRIO may no longer be used under the EUA). For patent information: http://rogers.info/ Copyright  2021-2022 [employer] [place], [place] and its affiliates. All rights reserved. [id number] Revised: March 2022

## 2021-04-18 DIAGNOSIS — C678 Malignant neoplasm of overlapping sites of bladder: Secondary | ICD-10-CM | POA: Diagnosis not present

## 2021-04-28 DIAGNOSIS — Z20822 Contact with and (suspected) exposure to covid-19: Secondary | ICD-10-CM | POA: Diagnosis not present

## 2021-05-01 ENCOUNTER — Encounter: Payer: Self-pay | Admitting: Family Medicine

## 2021-05-02 ENCOUNTER — Other Ambulatory Visit: Payer: Self-pay

## 2021-05-02 ENCOUNTER — Emergency Department (INDEPENDENT_AMBULATORY_CARE_PROVIDER_SITE_OTHER)
Admission: EM | Admit: 2021-05-02 | Discharge: 2021-05-02 | Disposition: A | Discharge status: Home / Self Care | Payer: Medicare Other | Source: Home / Self Care

## 2021-05-02 ENCOUNTER — Telehealth: Payer: Medicare Other | Admitting: Family Medicine

## 2021-05-02 ENCOUNTER — Emergency Department (INDEPENDENT_AMBULATORY_CARE_PROVIDER_SITE_OTHER): Payer: Medicare Other

## 2021-05-02 ENCOUNTER — Encounter: Payer: Self-pay | Admitting: Family Medicine

## 2021-05-02 DIAGNOSIS — R059 Cough, unspecified: Secondary | ICD-10-CM | POA: Diagnosis not present

## 2021-05-02 DIAGNOSIS — R509 Fever, unspecified: Secondary | ICD-10-CM | POA: Diagnosis not present

## 2021-05-02 DIAGNOSIS — J309 Allergic rhinitis, unspecified: Secondary | ICD-10-CM | POA: Diagnosis not present

## 2021-05-02 MED ORDER — PREDNISONE 20 MG PO TABS: ORAL_TABLET | ORAL | 0 refills | Status: DC

## 2021-05-02 MED ORDER — BENZONATATE 200 MG PO CAPS: 200.0000 mg | ORAL_CAPSULE | Freq: Three times a day (TID) | ORAL | 0 refills | Status: AC | PRN

## 2021-05-02 MED ORDER — FEXOFENADINE HCL 180 MG PO TABS: 180.0000 mg | ORAL_TABLET | Freq: Every day | ORAL | 0 refills | Status: DC

## 2021-05-02 MED ORDER — CEFDINIR 300 MG PO CAPS: 300.0000 mg | ORAL_CAPSULE | Freq: Two times a day (BID) | ORAL | 0 refills | Status: AC

## 2021-05-02 MED ORDER — ACETAMINOPHEN 325 MG PO TABS
650.0000 mg | ORAL_TABLET | Freq: Once | ORAL | Status: AC
  Administered 2021-05-02: 650 mg via ORAL

## 2021-05-02 NOTE — Discharge Instructions (Addendum)
Advised patient to take medication as directed with food to completion.  Advised patient to take prednisone burst and Allegra with first dose of antibiotic for the next 5 of 7 days.  May use Allegra as needed afterwards for concurrent postnasal drainage/drip.  Advised patient may use Tessalon Perles daily or as needed for cough.  Encouraged patient to increase daily water intake while taking these medications.

## 2021-05-02 NOTE — ED Provider Notes (Signed)
[name] CARE    CSN: [id number] Arrival date & time: 05/02/21  0909      History   Chief Complaint Chief Complaint  Patient presents with   Fever   Cough    HPI [name] is a 75 y.o. female.   HPI 75 year old female presents with cough, fever, chills, increased fatigue, body aches for 9 days.   Past Medical History:  Diagnosis Date   Anxiety    aniety attacks   Bladder tumor    Colon polyps    COPD (chronic obstructive pulmonary disease) ([place])    09-08-2020 acute flare up with cough finished antibiotics and steroids cough resolved now   Depression    Hay fever    allergies   History of kidney stones    Migraines    not in 40 years   Wears glasses     Patient Active Problem List   Diagnosis Date Noted   Allergic rhinitis 05/02/2021   Bladder cancer ([place]) 08/30/2020   Atrophic vaginitis 11/25/2018   Transient insomnia 05/03/2015   Distal radius fracture, right 11/03/2014   Acute bronchitis 06/14/2011   NEPHROLITHIASIS 05/04/2010   HEMATURIA UNSPECIFIED 11/03/2009   HYPERLIPIDEMIA 09/23/2009   PERS HX TOBACCO USE PRESENTING HAZARDS HEALTH 09/23/2009   Osteoporosis 10/05/2008   Depression, recurrent ([place]) 09/17/2008    Past Surgical History:  Procedure Laterality Date   BREAST BIOPSY  1990   caesarean secion  1974, 1977, 1984   [place]  [phone]   COLONOSCOPY W/ POLYPECTOMY  last done 2016    x 3    CYSTOSCOPY N/A 09/21/2020   Procedure: CYSTOSCOPY;  Surgeon: Robley Fries, MD;  Location: [place];  Service: Urology;  Laterality: N/A;   CYSTOSCOPY W/ RETROGRADES Bilateral 02/04/2020   Procedure: CYSTOSCOPY WITH RETROGRADE PYELOGRAM;  Surgeon: Lucas Mallow, MD;  Location: [facility];  Service: Urology;  Laterality: Bilateral;   OPEN REDUCTION INTERNAL FIXATION (ORIF) DISTAL RADIAL FRACTURE Right 11/03/2014   Procedure: OPEN REDUCTION INTERNAL FIXATION (ORIF) RIGHT DISTAL RADIAL  FRACTURE WITH REPAIR AND RECONSTRUCTION;  Surgeon: Roseanne Kaufman, MD;  Location: [place];  Service: Orthopedics;  Laterality: Right;   TRANSURETHRAL RESECTION OF BLADDER TUMOR WITH MITOMYCIN-C N/A 02/04/2020   Procedure: TRANSURETHRAL RESECTION OF BLADDER TUMOR WITH POST OPERATIVE GEMCITABINE;  Surgeon: Lucas Mallow, MD;  Location: [facility];  Service: Urology;  Laterality: N/A;   TRANSURETHRAL RESECTION OF BLADDER TUMOR WITH MITOMYCIN-C N/A 09/21/2020   Procedure: CYSTOSCOPY WITH BLADDER FULGARATION WITH POST OP GEMCITABINE;  Surgeon: Robley Fries, MD;  Location: [place];  Service: Urology;  Laterality: N/A;  1 HR    OB History   No obstetric history on file.      Home Medications    Prior to Admission medications   Medication Sig Start Date End Date Taking? Authorizing Provider  benzonatate (TESSALON) 200 MG capsule Take 1 capsule (200 mg total) by mouth 3 (three) times daily as needed for up to 7 days for cough. 05/02/21 05/09/21 Yes Eliezer Lofts, FNP  cefdinir (OMNICEF) 300 MG capsule Take 1 capsule (300 mg total) by mouth 2 (two) times daily for 7 days. 05/02/21 05/09/21 Yes Eliezer Lofts, FNP  fexofenadine [facility] ALLERGY) 180 MG tablet Take 1 tablet (180 mg total) by mouth daily for 15 days. 05/02/21 05/17/21 Yes Eliezer Lofts, FNP  predniSONE (DELTASONE) 20 MG tablet Take 3 tabs PO daily x 5 days. 05/02/21  Yes Eliezer Lofts, FNP  albuterol (VENTOLIN HFA) 108 (90 Base) MCG/ACT inhaler Inhale 2 puffs into the lungs every 6 (six) hours as needed for wheezing or shortness of breath. 09/08/20   Burchette, Alinda Sierras, MD  FLUoxetine (PROZAC) 20 MG capsule TAKE 1 CAPSULE BY MOUTH EVERY DAY 08/30/20   Burchette, Alinda Sierras, MD  Multiple Vitamin (MULTIVITAMIN) capsule Take 1 capsule by mouth daily. Patient not taking: Reported on 05/02/2021    [provider]    Family History Family History  Problem Relation Age of Onset   Arthritis  Other    Stroke Other    Hypertension Other    Depression Other    Migraines Other    Colon polyps Other    Breast cancer Neg Hx     Social History Social History   Tobacco Use   Smoking status: Former    Packs/day: 1.50    Years: 50.00    Pack years: 75.00    Types: Cigarettes    Quit date: 2012    Years since quitting: 10.8   Smokeless tobacco: Never   Tobacco comments:    Former smoker  Scientific laboratory technician Use: Never used  Substance Use Topics   Alcohol use: Yes    Alcohol/week: 2.0 standard drinks    Types: 2 Glasses of wine per week    Comment: 2 glasses of wine daily   Drug use: No     Allergies   Aspirin   Review of Systems Review of Systems  Constitutional:  Positive for fatigue and fever.  HENT:  Positive for congestion and postnasal drip.   Respiratory:  Positive for cough.   Musculoskeletal:  Positive for myalgias.    Physical Exam Triage Vital Signs ED Triage Vitals [05/02/21 0920]  Enc Vitals Group     BP      Pulse      Resp      Temp (!) 100.5 F (38.1 C)     Temp Source Tympanic     SpO2      Weight      Height      Head Circumference      Peak Flow      Pain Score      Pain Loc      Pain Edu?      Excl. in [place]?    No data found.  Updated Vital Signs BP 109/74 (BP Location: Right Arm)   Pulse 91   Temp (!) 100.5 F (38.1 C) (Tympanic) Comment (Src): drank water pta  Resp 18   Ht 5\' 7"  (1.702 m)   Wt 157 lb (71.2 kg)   SpO2 96%   BMI 24.59 kg/m   Physical Exam Vitals and nursing note reviewed.  Constitutional:      General: She is not in acute distress.    Appearance: Normal appearance. She is normal weight. She is not ill-appearing.  HENT:     Head: Normocephalic and atraumatic.     Right Ear: Tympanic membrane, ear canal and external ear normal.     Left Ear: Tympanic membrane, ear canal and external ear normal.     Mouth/Throat:     Mouth: Mucous membranes are moist.     Pharynx: Oropharynx is clear.      Comments: Moderate amount of clear drainage of posterior oropharynx noted. Eyes:     Extraocular Movements: Extraocular movements intact.     Conjunctiva/sclera: Conjunctivae normal.     Pupils: Pupils  are equal, round, and reactive to light.  Cardiovascular:     Rate and Rhythm: Normal rate and regular rhythm.     Pulses: Normal pulses.     Heart sounds: Normal heart sounds.  Pulmonary:     Effort: Pulmonary effort is normal.     Comments: Mild diffuse scattered rhonchi noted throughout, diminished breath sounds over RML and bibasilar , infrequent nonproductive cough noted on exam. Musculoskeletal:        General: Normal range of motion.     Cervical back: Normal range of motion and neck supple.  Skin:    General: Skin is warm and dry.  Neurological:     General: No focal deficit present.     Mental Status: She is alert and oriented to person, place, and time.  Psychiatric:        Mood and Affect: Mood normal.        Behavior: Behavior normal.     UC Treatments / Results  Labs (all labs ordered are listed, but only abnormal results are displayed) Labs Reviewed - No data to display  EKG   Radiology DG Chest 2 View  Result Date: 05/02/2021 CLINICAL DATA:  Cough EXAM: CHEST - 2 VIEW COMPARISON:  None. FINDINGS: The heart size and mediastinal contours are within normal limits. Aorta is tortuous. Both lungs are clear without evidence of focal consolidation or large pleural. No acute osseous abnormality. IMPRESSION: No active cardiopulmonary disease. Electronically Signed   By: Keane Police D.O.   On: 05/02/2021 09:50    Procedures Procedures (including critical care time)  Medications Ordered in UC Medications  acetaminophen (TYLENOL) tablet 650 mg (650 mg Oral Given 05/02/21 0930)    Initial Impression / Assessment and Plan / UC Course  I have reviewed the triage vital signs and the nursing notes.  Pertinent labs & imaging results that were available during my care of  the patient were reviewed by me and considered in my medical decision making (see chart for details).     MDM: 1.  Fever-Tylenol 1000 mg given once in clinic today; 2.  Cough-CXR revealed (above) Rx'd Cefdinir, Prednisone burst, and Tessalon Perles.; 3.  Allergic rhinitis-Rx'd Allegra. Advised patient to take medication as directed with food to completion.  Advised patient to take prednisone burst and Allegra with first dose of antibiotic for the next 5 of 7 days.  May use Allegra as needed afterwards for concurrent postnasal drainage/drip.  Advised patient may use Tessalon Perles daily or as needed for cough.  Encouraged patient to increase daily water intake while taking these medications.  Patient discharged home, hemodynamically stable. Final Clinical Impressions(s) / UC Diagnoses   Final diagnoses:  Fever, unspecified  Cough, unspecified type  Allergic rhinitis, unspecified seasonality, unspecified trigger     Discharge Instructions      Advised patient to take medication as directed with food to completion.  Advised patient to take prednisone burst and Allegra with first dose of antibiotic for the next 5 of 7 days.  May use Allegra as needed afterwards for concurrent postnasal drainage/drip.  Advised patient may use Tessalon Perles daily or as needed for cough.  Encouraged patient to increase daily water intake while taking these medications.     ED Prescriptions     Medication Sig Dispense Auth. Provider   cefdinir (OMNICEF) 300 MG capsule Take 1 capsule (300 mg total) by mouth 2 (two) times daily for 7 days. 14 capsule Eliezer Lofts, FNP   predniSONE (DELTASONE)  20 MG tablet Take 3 tabs PO daily x 5 days. 15 tablet Eliezer Lofts, FNP   fexofenadine [facility] ALLERGY) 180 MG tablet Take 1 tablet (180 mg total) by mouth daily for 15 days. 15 tablet Eliezer Lofts, FNP   benzonatate (TESSALON) 200 MG capsule Take 1 capsule (200 mg total) by mouth 3 (three) times daily as needed for  up to 7 days for cough. 30 capsule Eliezer Lofts, FNP      PDMP not reviewed this encounter.   Eliezer Lofts, [place] 05/02/21 1014

## 2021-05-02 NOTE — ED Triage Notes (Signed)
Chills started 9 days ago Fever started 9 days ago  Cough x 8 days  Cough is worse at night  Increased fatigue  Body aches  OTC Ibuprofen & Mucinex DM  PCP will only see her virtually Flu vaccine this year  COVID vaccine x 2 + 2 boosters

## 2021-05-03 ENCOUNTER — Other Ambulatory Visit: Payer: Self-pay | Admitting: Family Medicine

## 2021-05-03 DIAGNOSIS — Z1231 Encounter for screening mammogram for malignant neoplasm of breast: Secondary | ICD-10-CM

## 2021-06-01 ENCOUNTER — Telehealth: Payer: Self-pay | Admitting: Family Medicine

## 2021-06-01 NOTE — Telephone Encounter (Signed)
Patient calling in with respiratory symptoms: Shortness of breath, chest pain, palpitations or other red words send to Triage  Does the patient have a fever over 100, cough, congestion, sore throat, runny nose, lost of taste/smell (please list symptoms that patient has)? Lingering cough x 14 days symptoms start?05-18-2021 (If over 5 days ago, pt may be scheduled for in person visit)  Have you tested for Covid in the last 5 days? No   If yes, was it positive []  OR negative [] ? If positive in the last 5 days, please schedule virtual visit now. If negative, schedule for an in person OV with the next available provider if PCP has no openings. Please also let patient know they will be tested again (follow the script below)  "you will have to arrive 77mins prior to your appt time to be Covid tested. Please park in back of office at the cone & call [phone] to let the staff know you have arrived. A staff member will meet you at your car to do a rapid covid test. Once the test has resulted you will be notified by phone of your results to determine if appt will remain an in person visit or be converted to a virtual/phone visit. If you arrive less than 64mins before your appt time, your visit will be automatically converted to virtual & any recommended testing will happen AFTER the visit."  Pt has an appt with dr Elease Hashimoto on 06-03-2021 THINGS TO REMEMBER  If no availability for virtual visit in office,  please schedule another [place] office  If no availability at another [place] office, please instruct patient that they can schedule an evisit or virtual visit through their mychart account. Visits up to 8pm  patients can be seen in office 5 days after positive COVID test

## 2021-06-03 ENCOUNTER — Ambulatory Visit (INDEPENDENT_AMBULATORY_CARE_PROVIDER_SITE_OTHER): Payer: Medicare Other | Admitting: Family Medicine

## 2021-06-03 VITALS — BP 120/60 | HR 85 | Temp 97.6°F | Wt 163.6 lb

## 2021-06-03 DIAGNOSIS — R059 Cough, unspecified: Secondary | ICD-10-CM | POA: Diagnosis not present

## 2021-06-03 MED ORDER — HYDROCODONE BIT-HOMATROP MBR 5-1.5 MG/5ML PO SOLN: 5.0000 mL | Freq: Three times a day (TID) | ORAL | 0 refills | Status: DC | PRN

## 2021-06-03 NOTE — Patient Instructions (Signed)
Consider OTC Chlorpheniramine 4 mg at night- if have not already tried.  Let me know in 2 weeks if cough no better.

## 2021-06-03 NOTE — Progress Notes (Signed)
Established Patient Office Visit  Subjective:  Patient ID: [name], female    DOB: [birth year]  Age: 75 y.o. MRN: [id number]  CC:  Chief Complaint  Patient presents with   Follow-up    Urgent care follow up     HPI [name] presents for follow-up from recent ER/urgent care visit.  Back in November she developed some cough.  She has had COVID in September.  She went to the ER November 14 with symptoms of cough, fever, chills, body aches for 9 days.  Chest x-ray showed no pneumonia.  She was treated with cefdinir, prednisone, and Tessalon.  Symptoms did improve some and she has had no persistent fever since then though still has some cough especially at night.  Is not aware of any obvious wheezing.  No hemoptysis.  Appetite stable.  Does have a longstanding history of smoking and quit 2012  She is aware of some postnasal drainage.  She has tried Human resources officer and also tried some other type of over-the-counter which was possibly chlorpheniramine but she could not confirm  Wt Readings from Last 3 Encounters:  06/03/21 163 lb 9.6 oz (74.2 kg)  05/02/21 157 lb (71.2 kg)  03/04/21 165 lb 12.8 oz (75.2 kg)     Past Medical History:  Diagnosis Date   Anxiety    aniety attacks   Bladder tumor    Colon polyps    COPD (chronic obstructive pulmonary disease) ([place])    09-08-2020 acute flare up with cough finished antibiotics and steroids cough resolved now   Depression    Hay fever    allergies   History of kidney stones    Migraines    not in 40 years   Wears glasses     Past Surgical History:  Procedure Laterality Date   [place]   caesarean secion  1974, 1977, 1984   [place]  [phone]   COLONOSCOPY W/ POLYPECTOMY  last done 2016    x 3    CYSTOSCOPY N/A 09/21/2020   Procedure: CYSTOSCOPY;  Surgeon: Robley Fries, MD;  Location: [facility];  Service: Urology;  Laterality: N/A;   CYSTOSCOPY W/ RETROGRADES Bilateral  02/04/2020   Procedure: CYSTOSCOPY WITH RETROGRADE PYELOGRAM;  Surgeon: Lucas Mallow, MD;  Location: [facility];  Service: Urology;  Laterality: Bilateral;   OPEN REDUCTION INTERNAL FIXATION (ORIF) DISTAL RADIAL FRACTURE Right 11/03/2014   Procedure: OPEN REDUCTION INTERNAL FIXATION (ORIF) RIGHT DISTAL RADIAL FRACTURE WITH REPAIR AND RECONSTRUCTION;  Surgeon: Roseanne Kaufman, MD;  Location: [place];  Service: Orthopedics;  Laterality: Right;   TRANSURETHRAL RESECTION OF BLADDER TUMOR WITH MITOMYCIN-C N/A 02/04/2020   Procedure: TRANSURETHRAL RESECTION OF BLADDER TUMOR WITH POST OPERATIVE GEMCITABINE;  Surgeon: Lucas Mallow, MD;  Location: [facility];  Service: Urology;  Laterality: N/A;   TRANSURETHRAL RESECTION OF BLADDER TUMOR WITH MITOMYCIN-C N/A 09/21/2020   Procedure: CYSTOSCOPY WITH BLADDER FULGARATION WITH POST OP GEMCITABINE;  Surgeon: Robley Fries, MD;  Location: [place];  Service: Urology;  Laterality: N/A;  1 HR    Family History  Problem Relation Age of Onset   Arthritis Other    Stroke Other    Hypertension Other    Depression Other    Migraines Other    Colon polyps Other    Breast cancer Neg Hx     Social History   Socioeconomic History   Marital status: Married  Spouse name: Not on file   Number of children: 3   Years of education: 36   Highest education level: Some college, no degree  Occupational History    Comment: Retired   Tobacco Use   Smoking status: Former    Packs/day: 1.50    Years: 50.00    Pack years: 75.00    Types: Cigarettes    Quit date: 2012    Years since quitting: 10.9   Smokeless tobacco: Never   Tobacco comments:    Former smoker  Scientific laboratory technician Use: Never used  Substance and Sexual Activity   Alcohol use: Yes    Alcohol/week: 2.0 standard drinks    Types: 2 Glasses of wine per week    Comment: 2 glasses of wine daily   Drug use: No   Sexual activity: Not on file   Other Topics Concern   Not on file  Social History Narrative   Married   3 children   Social Determinants of Health   Financial Resource Strain: Low Risk    Difficulty of Paying Living Expenses: Not hard at all  Food Insecurity: No Food Insecurity   Worried About Charity fundraiser in the Last Year: Never true   Arboriculturist in the Last Year: Never true  Transportation Needs: No Transportation Needs   Lack of Transportation (Medical): No   Lack of Transportation (Non-Medical): No  Physical Activity: Inactive   Days of Exercise per Week: 0 days   Minutes of Exercise per Session: 40 min  Stress: No Stress Concern Present   Feeling of Stress : Only a little  Social Connections: Moderately Isolated   Frequency of Communication with Friends and Family: Three times a week   Frequency of Social Gatherings with Friends and Family: Once a week   Attends Religious Services: Never   Marine scientist or Organizations: No   Attends Music therapist: Never   Marital Status: Married  Human resources officer Violence: Not At Risk   Fear of Current or Ex-Partner: No   Emotionally Abused: No   Physically Abused: No   Sexually Abused: No    Outpatient Medications Prior to Visit  Medication Sig Dispense Refill   albuterol (VENTOLIN HFA) 108 (90 Base) MCG/ACT inhaler Inhale 2 puffs into the lungs every 6 (six) hours as needed for wheezing or shortness of breath. 8 g 2   FLUoxetine (PROZAC) 20 MG capsule TAKE 1 CAPSULE BY MOUTH EVERY DAY 90 capsule 3   Multiple Vitamin (MULTIVITAMIN) capsule Take 1 capsule by mouth daily.     fexofenadine (ALLEGRA ALLERGY) 180 MG tablet Take 1 tablet (180 mg total) by mouth daily for 15 days. 15 tablet 0   predniSONE (DELTASONE) 20 MG tablet Take 3 tabs PO daily x 5 days. 15 tablet 0   No facility-administered medications prior to visit.    Allergies  Allergen Reactions   Aspirin     REACTION: vasculitis    ROS Review of Systems   Constitutional:  Negative for appetite change, chills, fever and unexpected weight change.  Respiratory:  Positive for cough. Negative for shortness of breath and wheezing.   Cardiovascular:  Negative for chest pain, palpitations and leg swelling.     Objective:    Physical Exam Vitals reviewed.  Constitutional:      Appearance: Normal appearance.  Cardiovascular:     Rate and Rhythm: Normal rate and regular rhythm.  Pulmonary:  Effort: Pulmonary effort is normal.     Comments: She has some faint crackles in her right base.  Not clear if these are chronic or not.  No wheezing. Musculoskeletal:     Right lower leg: No edema.     Left lower leg: No edema.  Neurological:     Mental Status: She is alert.    BP 120/60 (BP Location: Left Arm, Patient Position: Sitting, Cuff Size: Normal)    Pulse 85    Temp 97.6 F (36.4 C) (Wanda)    Wt 163 lb 9.6 oz (74.2 kg)    SpO2 97%    BMI 25.62 kg/m  Wt Readings from Last 3 Encounters:  06/03/21 163 lb 9.6 oz (74.2 kg)  05/02/21 157 lb (71.2 kg)  03/04/21 165 lb 12.8 oz (75.2 kg)     Health Maintenance Due  Topic Date Due   Pneumonia Vaccine 37+ Years old (3 - PPSV23 if available, else PCV20) 08/31/2020    There are no preventive care reminders to display for this patient.  Lab Results  Component Value Date   TSH 0.89 08/30/2020   Lab Results  Component Value Date   WBC 5.2 11/03/2014   HGB 13.4 11/03/2014   HCT 41.0 11/03/2014   MCV 94.3 11/03/2014   PLT 268 11/03/2014   Lab Results  Component Value Date   NA 141 08/30/2020   K 3.7 08/30/2020   CO2 29 08/30/2020   GLUCOSE 94 08/30/2020   BUN 22 08/30/2020   CREATININE 0.99 08/30/2020   BILITOT 0.8 06/06/2012   ALKPHOS 62 06/06/2012   AST 18 06/06/2012   ALT 16 06/06/2012   PROT 7.0 06/06/2012   ALBUMIN 4.0 06/06/2012   CALCIUM 9.4 08/30/2020   GFR 55.74 (L) 08/30/2020   Lab Results  Component Value Date   CHOL 218 (H) 06/06/2012   Lab Results  Component  Value Date   HDL 70.30 06/06/2012   Lab Results  Component Value Date   LDLCALC 91 05/04/2010   Lab Results  Component Value Date   TRIG 64.0 06/06/2012   Lab Results  Component Value Date   CHOLHDL 3 06/06/2012   No results found for: HGBA1C    Assessment & Plan:   Persistent mostly nighttime cough following respiratory illness back in November.  She also had COVID back in September.  She has not had red flags such as hemoptysis, recent weight loss, fever, etc.  Recent chest x-ray unremarkable.  -We recommended short-term trial of cough suppression with Hycodan 1 teaspoon nightly. -Consider trial of over-the-counter chlorpheniramine 4 mg nightly -If cough not resolving in a couple weeks consider repeat chest x-ray and follow-up sooner as needed  Meds ordered this encounter  Medications   HYDROcodone bit-homatropine (HYCODAN) 5-1.5 MG/5ML syrup    Sig: Take 5 mLs by mouth every 8 (eight) hours as needed for cough.    Dispense:  120 mL    Refill:  0    Follow-up: No follow-ups on file.    Carolann Littler, MD

## 2021-06-15 ENCOUNTER — Ambulatory Visit
Admission: RE | Admit: 2021-06-15 | Discharge: 2021-06-15 | Disposition: A | Discharge status: Home / Self Care | Payer: Medicare Other | Source: Ambulatory Visit | Attending: Family Medicine | Admitting: Family Medicine

## 2021-06-15 ENCOUNTER — Other Ambulatory Visit: Payer: Self-pay

## 2021-06-15 DIAGNOSIS — Z1231 Encounter for screening mammogram for malignant neoplasm of breast: Secondary | ICD-10-CM | POA: Diagnosis not present

## 2021-06-16 DIAGNOSIS — Z23 Encounter for immunization: Secondary | ICD-10-CM | POA: Diagnosis not present

## 2021-06-22 ENCOUNTER — Telehealth: Payer: Self-pay | Admitting: Family Medicine

## 2021-06-22 NOTE — Telephone Encounter (Signed)
Left message for patient to call back and schedule Medicare Annual Wellness Visit (AWV) either virtually or in office. Left  my Herbie Drape number [phone]   Last AWV 06/28/20 ; please schedule at anytime with [facility] Nurse Health Advisor 1 or 2   This should be a 45 minute visit.

## 2021-07-18 ENCOUNTER — Ambulatory Visit (INDEPENDENT_AMBULATORY_CARE_PROVIDER_SITE_OTHER): Payer: Medicare Other

## 2021-07-18 VITALS — BP 110/62 | HR 73 | Temp 97.9°F | Ht 67.0 in | Wt 163.1 lb

## 2021-07-18 DIAGNOSIS — Z0000 Encounter for general adult medical examination without abnormal findings: Secondary | ICD-10-CM | POA: Diagnosis not present

## 2021-07-18 NOTE — Patient Instructions (Addendum)
[name] , Thank you for taking time to come for your Medicare Wellness Visit. I appreciate your ongoing commitment to your health goals. Please review the following plan we discussed and let me know if I can assist you in the future.   Screening recommendations/referrals: Colonoscopy: Done 07/05/2017 - repeat 5 years Mammogram: 06/15/21 repeat yearly Bone Density: 12/08/20 Repeat 2 years Recommended yearly ophthalmology/optometry visit for glaucoma screening and checkup Recommended yearly dental visit for hygiene and checkup  Vaccinations: Influenza vaccine: 03/14/21 Repeat yearly Pneumococcal vaccine: 09/01/19 and 11/03/09 Tdap vaccine: 06/06/2019 Repeat 10 Years Shingles vaccine: 06/30/17 and 03/11/17 and    Covid-19:02/16/20, 08/04/19, 07/14/19, 01/13/21, 06/16/21  Advanced directives: Please bring a copy of your health care power of attorney and living will to the office at your convenience.   Conditions/risks identified: None  Next appointment: Follow up in one year for your annual wellness visit    Preventive Care 65 Years and Older, Female Preventive care refers to lifestyle choices and visits with your health care provider that can promote health and wellness. What does preventive care include? A yearly physical exam. This is also called an annual well check. Dental exams once or twice a year. Routine eye exams. Ask your health care provider how often you should have your eyes checked. Personal lifestyle choices, including: Daily care of your teeth and gums. Regular physical activity. Eating a healthy diet. Avoiding tobacco and drug use. Limiting alcohol use. Practicing safe sex. Taking low-dose aspirin every day. Taking vitamin and mineral supplements as recommended by your health care provider. What happens during an annual well check? The services and screenings done by your health care provider during your annual well check will depend on your age, overall health,  lifestyle risk factors, and family history of disease. Counseling  Your health care provider may ask you questions about your: Alcohol use. Tobacco use. Drug use. Emotional well-being. Home and relationship well-being. Sexual activity. Eating habits. History of falls. Memory and ability to understand (cognition). Work and work Statistician. Reproductive health. Screening  You may have the following tests or measurements: Height, weight, and BMI. Blood pressure. Lipid and cholesterol levels. These may be checked every 5 years, or more frequently if you are over 63 years old. Skin check. Lung cancer screening. You may have this screening every year starting at age 35 if you have a 30-pack-year history of smoking and currently smoke or have quit within the past 15 years. Fecal occult blood test (FOBT) of the stool. You may have this test every year starting at age 32. Flexible sigmoidoscopy or colonoscopy. You may have a sigmoidoscopy every 5 years or a colonoscopy every 10 years starting at age 63. Hepatitis C blood test. Hepatitis B blood test. Sexually transmitted disease (STD) testing. Diabetes screening. This is done by checking your blood sugar (glucose) after you have not eaten for a while (fasting). You may have this done every 1-3 years. Bone density scan. This is done to screen for osteoporosis. You may have this done starting at age 34. Mammogram. This may be done every 1-2 years. Talk to your health care provider about how often you should have regular mammograms. Talk with your health care provider about your test results, treatment options, and if necessary, the need for more tests. Vaccines  Your health care provider may recommend certain vaccines, such as: Influenza vaccine. This is recommended every year. Tetanus, diphtheria, and acellular pertussis (Tdap, Td) vaccine. You may need a Td booster every 10  years. Zoster vaccine. You may need this after age  21. Pneumococcal 13-valent conjugate (PCV13) vaccine. One dose is recommended after age 110. Pneumococcal polysaccharide (PPSV23) vaccine. One dose is recommended after age 45. Talk to your health care provider about which screenings and vaccines you need and how often you need them. This information is not intended to replace advice given to you by your health care provider. Make sure you discuss any questions you have with your health care provider. Document Released: 07/02/2015 Document Revised: 02/23/2016 Document Reviewed: 04/06/2015 Elsevier Interactive Patient Education  2017 [employer] Prevention in the Home Falls can cause injuries. They can happen to people of all ages. There are many things you can do to make your home safe and to help prevent falls. What can I do on the outside of my home? Regularly fix the edges of walkways and driveways and fix any cracks. Remove anything that might make you trip as you walk through a door, such as a raised step or threshold. Trim any bushes or trees on the path to your home. Use bright outdoor lighting. Clear any walking paths of anything that might make someone trip, such as rocks or tools. Regularly check to see if handrails are loose or broken. Make sure that both sides of any steps have handrails. Any raised decks and porches should have guardrails on the edges. Have any leaves, snow, or ice cleared regularly. Use sand or salt on walking paths during winter. Clean up any spills in your garage right away. This includes oil or grease spills. What can I do in the bathroom? Use night lights. Install grab bars by the toilet and in the tub and shower. Do not use towel bars as grab bars. Use non-skid mats or decals in the tub or shower. If you need to sit down in the shower, use a plastic, non-slip stool. Keep the floor dry. Clean up any water that spills on the floor as soon as it happens. Remove soap buildup in the tub or shower  regularly. Attach bath mats securely with double-sided non-slip rug tape. Do not have throw rugs and other things on the floor that can make you trip. What can I do in the bedroom? Use night lights. Make sure that you have a light by your bed that is easy to reach. Do not use any sheets or blankets that are too big for your bed. They should not hang down onto the floor. Have a firm chair that has side arms. You can use this for support while you get dressed. Do not have throw rugs and other things on the floor that can make you trip. What can I do in the kitchen? Clean up any spills right away. Avoid walking on wet floors. Keep items that you use a lot in easy-to-reach places. If you need to reach something above you, use a strong step stool that has a grab bar. Keep electrical cords out of the way. Do not use floor polish or wax that makes floors slippery. If you must use wax, use non-skid floor wax. Do not have throw rugs and other things on the floor that can make you trip. What can I do with my stairs? Do not leave any items on the stairs. Make sure that there are handrails on both sides of the stairs and use them. Fix handrails that are broken or loose. Make sure that handrails are as long as the stairways. Check any carpeting to make sure  that it is firmly attached to the stairs. Fix any carpet that is loose or worn. Avoid having throw rugs at the top or bottom of the stairs. If you do have throw rugs, attach them to the floor with carpet tape. Make sure that you have a light switch at the top of the stairs and the bottom of the stairs. If you do not have them, ask someone to add them for you. What else can I do to help prevent falls? Wear shoes that: Do not have high heels. Have rubber bottoms. Are comfortable and fit you well. Are closed at the toe. Do not wear sandals. If you use a stepladder: Make sure that it is fully opened. Do not climb a closed stepladder. Make sure that  both sides of the stepladder are locked into place. Ask someone to hold it for you, if possible. Clearly mark and make sure that you can see: Any grab bars or handrails. First and last steps. Where the edge of each step is. Use tools that help you move around (mobility aids) if they are needed. These include: Canes. Walkers. Scooters. Crutches. Turn on the lights when you go into a dark area. Replace any light bulbs as soon as they burn out. Set up your furniture so you have a clear path. Avoid moving your furniture around. If any of your floors are uneven, fix them. If there are any pets around you, be aware of where they are. Review your medicines with your doctor. Some medicines can make you feel dizzy. This can increase your chance of falling. Ask your doctor what other things that you can do to help prevent falls. This information is not intended to replace advice given to you by your health care provider. Make sure you discuss any questions you have with your health care provider. Document Released: 04/01/2009 Document Revised: 11/11/2015 Document Reviewed: 07/10/2014 Elsevier Interactive Patient Education  2017 [employer].

## 2021-07-18 NOTE — Progress Notes (Signed)
Subjective:   [name] is a 76 y.o. female who presents for Medicare Annual (Subsequent) preventive examination.  Review of Systems     Cardiac Risk Factors include: advanced age (>57men, >64 women)     Objective:    Today's Vitals   07/18/21 1033  BP: 110/62  Pulse: 73  Temp: 97.9 F (36.6 C)  TempSrc: Oral  SpO2: 94%  Weight: 163 lb 1.6 oz (74 kg)  Height: 5\' 7"  (1.702 m)   Body mass index is 25.55 kg/m.  Advanced Directives 07/18/2021 09/21/2020 06/28/2020 02/04/2020 06/05/2019  Does Patient Have a Medical Advance Directive? Yes Yes Yes Yes Yes  Type of Paramedic of [place];Living will [place];Living will [place];Living will [place];Living will [place];Living will  Does patient want to make changes to medical advance directive? No - Patient declined - No - Patient declined No - Patient declined No - Patient declined  Copy of [place] in Chart? No - copy requested No - copy requested No - copy requested No - copy requested No - copy requested    Current Medications (verified) Outpatient Encounter Medications as of 07/18/2021  Medication Sig   FLUoxetine (PROZAC) 20 MG capsule TAKE 1 CAPSULE BY MOUTH EVERY DAY   [DISCONTINUED] albuterol (VENTOLIN HFA) 108 (90 Base) MCG/ACT inhaler Inhale 2 puffs into the lungs every 6 (six) hours as needed for wheezing or shortness of breath.   [DISCONTINUED] HYDROcodone bit-homatropine (HYCODAN) 5-1.5 MG/5ML syrup Take 5 mLs by mouth every 8 (eight) hours as needed for cough.   [DISCONTINUED] Multiple Vitamin (MULTIVITAMIN) capsule Take 1 capsule by mouth daily.   No facility-administered encounter medications on file as of 07/18/2021.    Allergies (verified) Aspirin   History: Past Medical History:  Diagnosis Date   Anxiety    aniety attacks   Bladder tumor    Colon polyps    COPD (chronic  obstructive pulmonary disease) ([place])    09-08-2020 acute flare up with cough finished antibiotics and steroids cough resolved now   Depression    Hay fever    allergies   History of kidney stones    Migraines    not in 40 years   Wears glasses    Past Surgical History:  Procedure Laterality Date   [place]   caesarean secion  1974, 1977, 1984   [place]  [phone]   COLONOSCOPY W/ POLYPECTOMY  last done 2016    x 3    CYSTOSCOPY N/A 09/21/2020   Procedure: CYSTOSCOPY;  Surgeon: Robley Fries, MD;  [facility];  Service: Urology;  Laterality: N/A;   CYSTOSCOPY W/ RETROGRADES Bilateral 02/04/2020   Procedure: CYSTOSCOPY WITH RETROGRADE PYELOGRAM;  Surgeon: Lucas Mallow, MD;  Location: [facility];  Service: Urology;  Laterality: Bilateral;   OPEN REDUCTION INTERNAL FIXATION (ORIF) DISTAL RADIAL FRACTURE Right 11/03/2014   Procedure: OPEN REDUCTION INTERNAL FIXATION (ORIF) RIGHT DISTAL RADIAL FRACTURE WITH REPAIR AND RECONSTRUCTION;  Surgeon: Roseanne Kaufman, MD;  Location: [facility];  Service: Orthopedics;  Laterality: Right;   TRANSURETHRAL RESECTION OF BLADDER TUMOR WITH MITOMYCIN-C N/A 02/04/2020   Procedure: TRANSURETHRAL RESECTION OF BLADDER TUMOR WITH POST OPERATIVE GEMCITABINE;  Surgeon: Lucas Mallow, MD;  Location: [facility];  Service: Urology;  Laterality: N/A;   TRANSURETHRAL RESECTION OF BLADDER TUMOR WITH MITOMYCIN-C N/A 09/21/2020   Procedure: CYSTOSCOPY WITH  BLADDER FULGARATION WITH POST OP GEMCITABINE;  Surgeon: Robley Fries, MD;  Location: [facility];  Service: Urology;  Laterality: N/A;  1 HR   Family History  Problem Relation Age of Onset   Arthritis Other    Stroke Other    Hypertension Other    Depression Other    Migraines Other    Colon polyps Other    Breast cancer Neg Hx    Social History   Socioeconomic History   Marital status: Married    Spouse  name: Not on file   Number of children: 3   Years of education: 13   Highest education level: Some college, no degree  Occupational History    Comment: Retired   Tobacco Use   Smoking status: Former    Packs/day: 1.50    Years: 50.00    Pack years: 75.00    Types: Cigarettes    Quit date: 2012    Years since quitting: 11.0   Smokeless tobacco: Never   Tobacco comments:    Former smoker  Scientific laboratory technician Use: Never used  Substance and Sexual Activity   Alcohol use: Yes    Alcohol/week: 2.0 standard drinks    Types: 2 Glasses of wine per week    Comment: 2 glasses of wine daily   Drug use: No   Sexual activity: Not on file  Other Topics Concern   Not on file  Social History Narrative   Married   3 children   Social Determinants of Health   Financial Resource Strain: Low Risk    Difficulty of Paying Living Expenses: Not hard at all  Food Insecurity: No Food Insecurity   Worried About Charity fundraiser in the Last Year: Never true   Arboriculturist in the Last Year: Never true  Transportation Needs: No Transportation Needs   Lack of Transportation (Medical): No   Lack of Transportation (Non-Medical): No  Physical Activity: Insufficiently Active   Days of Exercise per Week: 2 days   Minutes of Exercise per Session: 20 min  Stress: No Stress Concern Present   Feeling of Stress : Only a little  Social Connections: Moderately Isolated   Frequency of Communication with Friends and Family: Three times a week   Frequency of Social Gatherings with Friends and Family: Twice a week   Attends Religious Services: Never   Marine scientist or Organizations: No   Attends Music therapist: Never   Marital Status: Married    Tobacco Counseling Counseling given: Not Answered Tobacco comments: Former smoker   Clinical Intake:  Pre-visit preparation completed: Yes  Pain : No/denies pain     BMI - recorded: 25.55 Nutritional Status: BMI 25 -29  Overweight Nutritional Risks: None Diabetes: No  How often do you need to have someone help you when you read instructions, pamphlets, or other written materials from your doctor or pharmacy?: 1 - Never  Diabetic? No  Interpreter Needed?: No  Information entered by :: Rolene Arbour LPN   Activities of Daily Living In your present state of health, do you have any difficulty performing the following activities: 07/18/2021 07/18/2021  Hearing? N N  Vision? N N  Difficulty concentrating or making decisions? N N  Walking or climbing stairs? N N  Dressing or bathing? N N  Doing errands, shopping? N N  Preparing Food and eating ? N N  Using the Toilet? N N  In the past  six months, have you accidently leaked urine? N N  Do you have problems with loss of bowel control? N N  Managing your Medications? N -  Managing your Finances? N -  Housekeeping or managing your Housekeeping? N N  Some recent data might be hidden    Patient Care Team: Eulas Post, MD as PCP - General  Indicate any recent Medical Services you may have received from other than [facility] providers in the past year (date may be approximate).     Assessment:   This is a routine wellness examination for [place].  Hearing/Vision screen Hearing Screening - Comments:: No difficulty hearing Vision Screening - Comments:: Wears glasses. Followed by Dr Delman Cheadle  Dietary issues and exercise activities discussed: Current Exercise Habits: The patient does not participate in regular exercise at present, Exercise limited by: None identified   Goals Addressed   None    Depression Screen PHQ 2/9 Scores 07/18/2021 06/28/2020 06/05/2019 01/22/2018 09/02/2014  PHQ - 2 Score 0 0 1 0 0  PHQ- 9 Score - 0 - - -    Fall Risk Fall Risk  07/18/2021 07/18/2021 06/02/2021 06/28/2020 06/05/2019  Falls in the past year? 1 1 1  0 0  Comment - - - - -  Number falls in past yr: 0 1 1 0 -  Comment Fall w/o injury or medical attention - - - -   Injury with Fall? 0 0 0 0 -  Risk for fall due to : No Fall Risks - - No Fall Risks -  Follow up Falls evaluation completed - - Falls evaluation completed;Falls prevention discussed -    FALL RISK PREVENTION PERTAINING TO THE HOME:  Any stairs in or around the home? Yes  If so, are there any without handrails? No  Home free of loose throw rugs in walkways, pet beds, electrical cords, etc? Yes  Adequate lighting in your home to reduce risk of falls? Yes   ASSISTIVE DEVICES UTILIZED TO PREVENT FALLS:  Life alert? No  Use of a cane, walker or w/c? No  Grab bars in the bathroom? No  Shower chair or bench in shower? No  Elevated toilet seat or a handicapped toilet? No   TIMED UP AND GO:  Was the test performed? Yes .  Length of time to ambulate 10 feet: 5 sec.   Gait steady and fast without use of assistive device  Cognitive Function:     6CIT Screen 07/18/2021 06/05/2019  What Year? 0 points 0 points  What month? 0 points 0 points  What time? 0 points 0 points  Count back from 20 0 points 0 points  Months in reverse 0 points 0 points  Repeat phrase 0 points 0 points  Total Score 0 0    Immunizations Immunization History  Administered Date(s) Administered   Hepatitis A, Adult 05/03/2015   Influenza Split 03/14/2011, 04/29/2012, 02/20/2013   Influenza Whole 03/19/2010   Influenza, High Dose Seasonal PF 03/26/2014, 03/11/2017, 02/04/2018, 02/11/2019   Influenza-Unspecified 03/16/2015, 03/06/2016, 03/11/2017, 02/06/2020, 03/14/2021   PFIZER(Purple Top)SARS-COV-2 Vaccination 07/14/2019, 08/04/2019, 02/16/2020   PNEUMOCOCCAL CONJUGATE-20 06/16/2021   Pfizer Covid-19 Vaccine Bivalent Booster 69yrs & up 01/13/2021, 06/16/2021   Pneumococcal Conjugate-13 01/02/2017, 09/01/2019   Pneumococcal Polysaccharide-23 11/03/2009   Td 06/19/2006, 08/27/2007   Tdap 06/06/2019   Zoster Recombinat (Shingrix) 03/11/2017, 06/30/2017    TDAP status: Up to date  Flu Vaccine status: Up  to date  Pneumococcal vaccine status: Up to date  Covid-19 vaccine status: Completed  vaccines  Qualifies for Shingles Vaccine? Yes   Zostavax completed Yes   Shingrix Completed?: Yes  Screening Tests Health Maintenance  Topic Date Due   COLONOSCOPY (Pts 45-29yrs Insurance coverage will need to be confirmed)  07/05/2022   DEXA SCAN  12/09/2022   TETANUS/TDAP  06/05/2029   Pneumonia Vaccine 58+ Years old  Completed   INFLUENZA VACCINE  Completed   COVID-19 Vaccine  Completed   Hepatitis C Screening  Completed   Zoster Vaccines- Shingrix  Completed   HPV VACCINES  Aged Out    Health Maintenance  There are no preventive care reminders to display for this patient.  Colorectal cancer screening: Type of screening: Colonoscopy. Completed 07/05/17. Repeat every 5 years  Mammogram status: Completed 06/15/21. Repeat every year  Bone Density status: Completed 12/08/20. Results reflect: Bone density results: OSTEOPOROSIS. Repeat every 2 years.  Lung Cancer Screening: (Low Dose CT Chest recommended if Age 61-80 years, 30 pack-year currently smoking OR have quit w/in 15years.) does not qualify.     Additional Screening:  Hepatitis C Screening: does qualify; Completed 01/02/17   Vision Screening: Recommended annual ophthalmology exams for early detection of glaucoma and other disorders of the eye. Is the patient up to date with their annual eye exam?  Yes  Who is the provider or what is the name of the office in which the patient attends annual eye exams? Dr Delman Cheadle If pt is not established with a provider, would they like to be referred to a provider to establish care? No .   Dental Screening: Recommended annual dental exams for proper oral hygiene  Community Resource Referral / Chronic Care Management: CRR required this visit?  No   CCM required this visit?  No      Plan:     I have personally reviewed and noted the following in the patients chart:   Medical and social  history Use of alcohol, tobacco or illicit drugs  Current medications and supplements including opioid prescriptions.  Functional ability and status Nutritional status Physical activity Advanced directives List of other physicians Hospitalizations, surgeries, and ER visits in previous 12 months Vitals Screenings to include cognitive, depression, and falls Referrals and appointments  In addition, I have reviewed and discussed with patient certain preventive protocols, quality metrics, and best practice recommendations. A written personalized care plan for preventive services as well as general preventive health recommendations were provided to patient.     Criselda Peaches, LPN   [id number]/[id number]   Nurse Notes: None

## 2021-07-22 DIAGNOSIS — C678 Malignant neoplasm of overlapping sites of bladder: Secondary | ICD-10-CM | POA: Diagnosis not present

## 2021-07-29 ENCOUNTER — Other Ambulatory Visit: Payer: Self-pay | Admitting: Family Medicine

## 2021-08-14 IMAGING — CT CT CHEST LUNG CANCER SCREENING LOW DOSE W/O CM
1 series · 10 of 10 positions shown, 13 images · non-contrast
Comparison: 01/14/2018

CLINICAL DATA: 73-year-old former smoker with 75 pack year history
of smoking.

EXAM:
CT CHEST WITHOUT CONTRAST LOW-DOSE FOR LUNG CANCER SCREENING
TECHNIQUE: Multidetector CT imaging of the chest was performed following the
standard protocol without IV contrast.

[ct lung segmentation data · axial · 0.71mm/px · z∈[-356,-356]mm · 10 of 314 frames shown]
[frame 1/314  mediastinal]
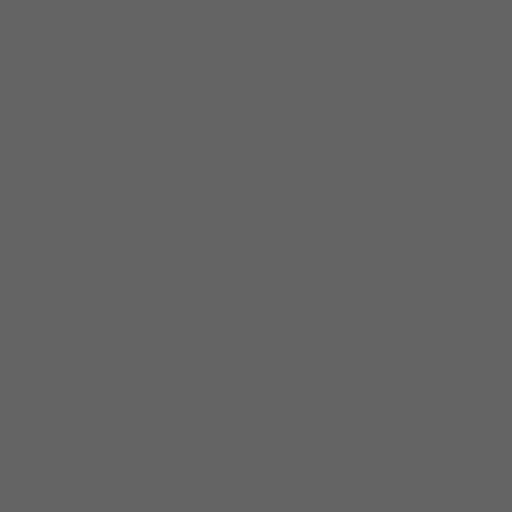
[frame 1/314  lung]
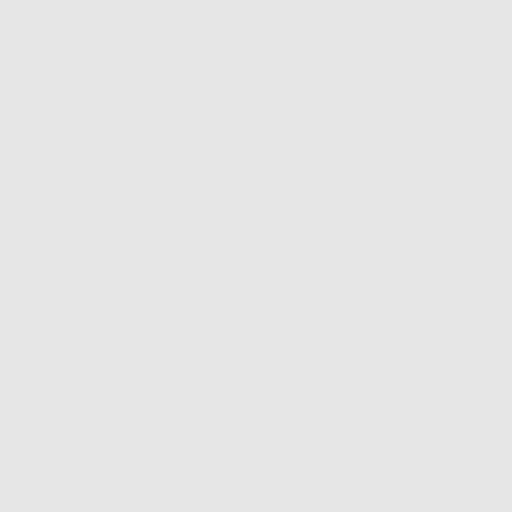
[frame 35/314  lung]
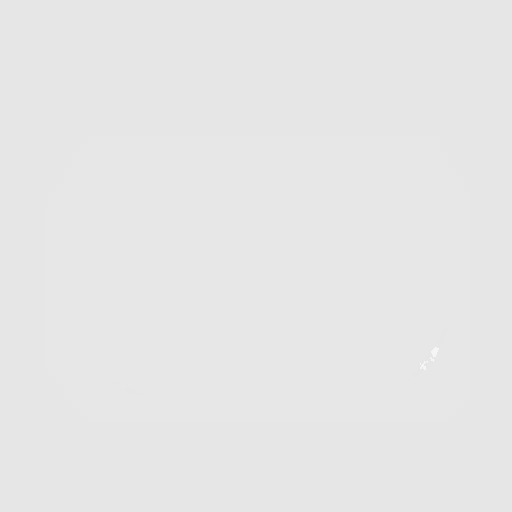
[frame 70/314  lung]
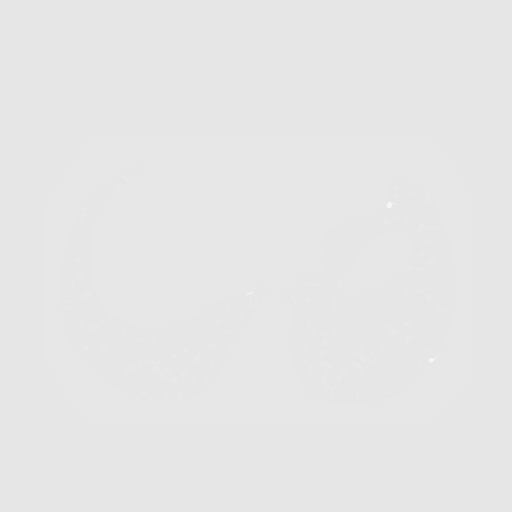
[frame 105/314  lung]
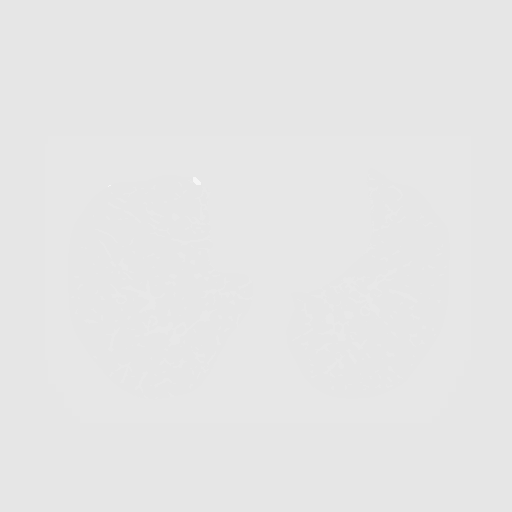
[frame 140/314  mediastinal]
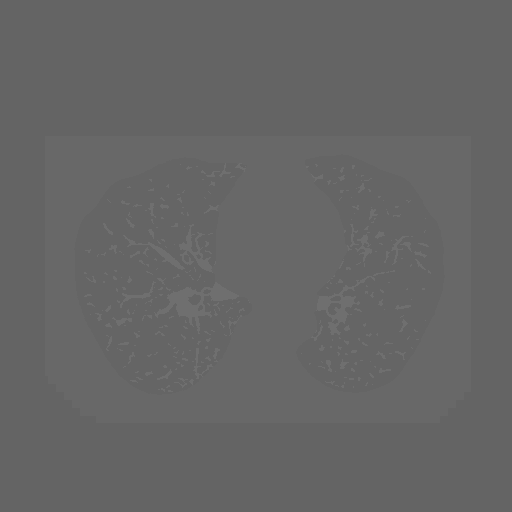
[frame 140/314  lung]
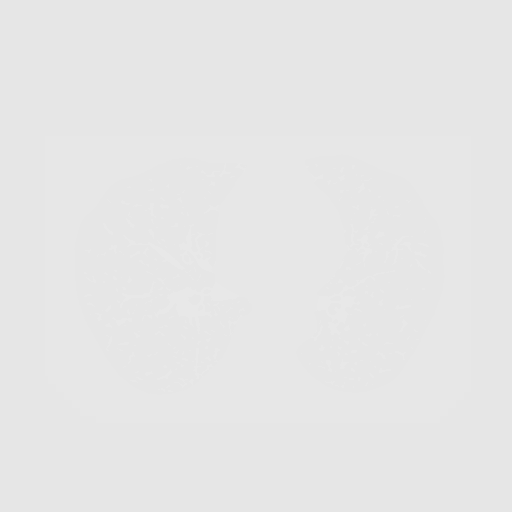
[frame 174/314  lung]
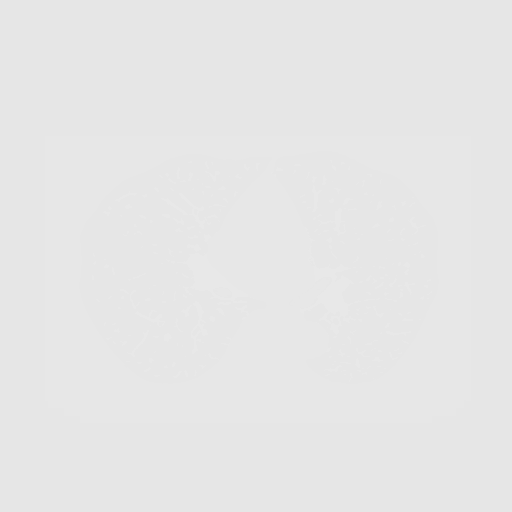
[frame 209/314  lung]
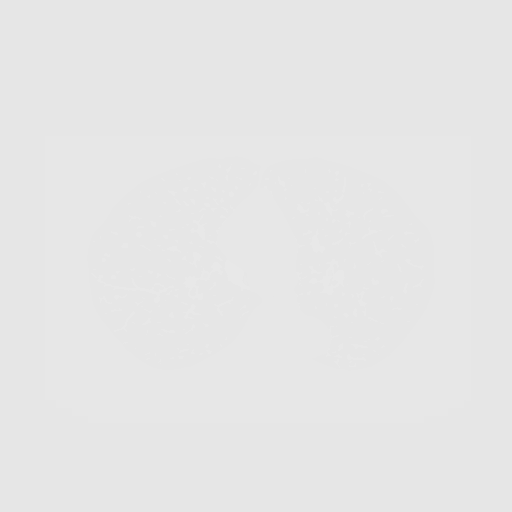
[frame 244/314  lung]
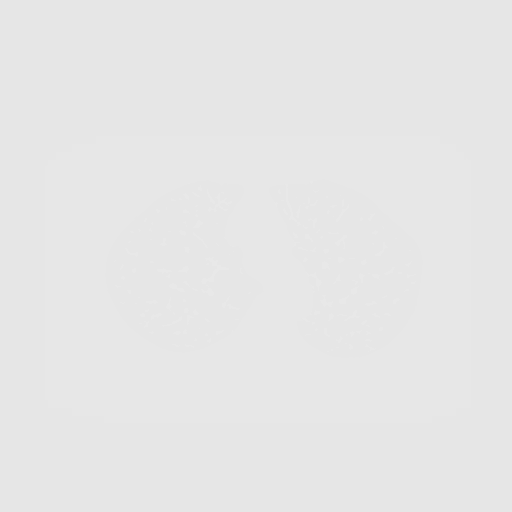
[frame 279/314  mediastinal]
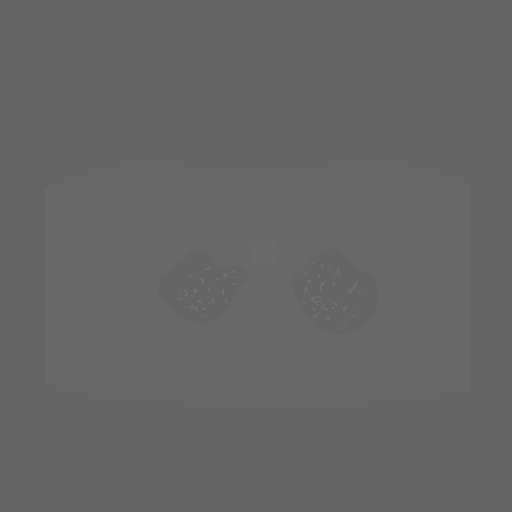
[frame 279/314  lung]
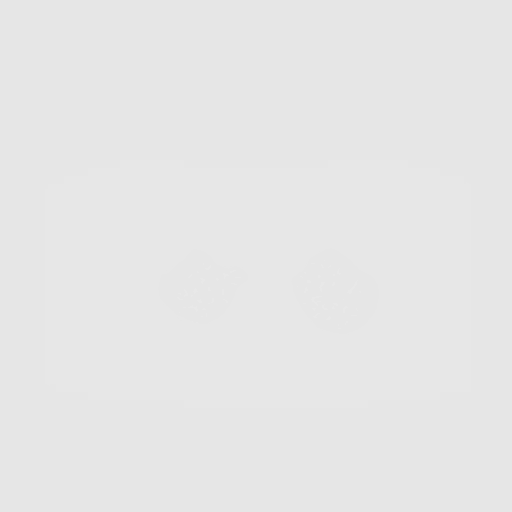
[frame 314/314  lung]
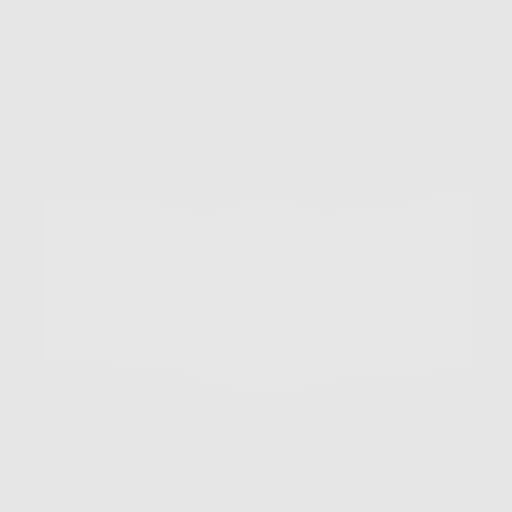

[10 of 10 positions shown; findings below may reference images not displayed]

FINDINGS: Cardiovascular: The heart size is normal. No substantial pericardial
effusion. Atherosclerotic calcification is noted in the wall of the
thoracic aorta.

Mediastinum/Nodes: No mediastinal lymphadenopathy. No evidence for
gross hilar lymphadenopathy although assessment is limited by the
lack of intravenous contrast on today's study. The esophagus has
normal imaging features. There is no axillary lymphadenopathy.

Lungs/Pleura: Centrilobular emphsyema noted. Scattered tiny
bilateral pulmonary nodules again identified. No overtly suspicious
nodule or mass. No focal airspace consolidation. No pleural
effusion.

Upper Abdomen: Unremarkable.

Musculoskeletal: No worrisome lytic or sclerotic osseous
abnormality.
IMPRESSION: 1. Lung-RADS 2, benign appearance or behavior. Continue annual
screening with low-dose chest CT without contrast in 12 months.
2.  Emphysema. (SOR1K-UAH.C)
3.  Aortic Atherosclerois (SOR1K-170.0)

## 2021-10-19 DIAGNOSIS — C678 Malignant neoplasm of overlapping sites of bladder: Secondary | ICD-10-CM | POA: Diagnosis not present

## 2022-01-26 DIAGNOSIS — Z23 Encounter for immunization: Secondary | ICD-10-CM | POA: Diagnosis not present

## 2022-02-03 DIAGNOSIS — C678 Malignant neoplasm of overlapping sites of bladder: Secondary | ICD-10-CM | POA: Diagnosis not present

## 2022-02-13 DIAGNOSIS — C678 Malignant neoplasm of overlapping sites of bladder: Secondary | ICD-10-CM | POA: Diagnosis not present

## 2022-03-01 DIAGNOSIS — C678 Malignant neoplasm of overlapping sites of bladder: Secondary | ICD-10-CM | POA: Diagnosis not present

## 2022-03-07 ENCOUNTER — Ambulatory Visit (INDEPENDENT_AMBULATORY_CARE_PROVIDER_SITE_OTHER): Payer: Medicare Other | Admitting: Family Medicine

## 2022-03-07 ENCOUNTER — Ambulatory Visit (INDEPENDENT_AMBULATORY_CARE_PROVIDER_SITE_OTHER): Payer: Medicare Other

## 2022-03-07 ENCOUNTER — Encounter: Payer: Self-pay | Admitting: Family Medicine

## 2022-03-07 VITALS — BP 122/70 | HR 94 | Temp 98.4°F | Ht 67.0 in | Wt 164.2 lb

## 2022-03-07 DIAGNOSIS — R059 Cough, unspecified: Secondary | ICD-10-CM | POA: Diagnosis not present

## 2022-03-07 MED ORDER — HYDROCODONE BIT-HOMATROP MBR 5-1.5 MG/5ML PO SOLN: 5.0000 mL | Freq: Four times a day (QID) | ORAL | 0 refills | Status: DC | PRN

## 2022-03-07 NOTE — Progress Notes (Signed)
Established Patient Office Visit  Subjective   Patient ID: JANELI LEWISON, female    DOB: Feb 13, 1946  Age: 76 y.o. MRN: [id number]  Chief Complaint  Patient presents with   Cough    Patient complains of cough, Non productive, X5 days    Emesis    Patient complains of emesis, x5 days     HPI   [name] is seen with approxi-1 week history of cough which is mostly dry and quite severe at times.  Denies any known sick contacts.  No fever.  No wheezing.  She has been coughing to the point of gagging and actually a few episodes of emesis.  Denies any nausea.  No diarrhea.  No postnasal drip.  No GERD symptoms.  She has history of bladder cancer had recent CT abdomen pelvis which came back apparently unremarkable.  She quit smoking about 12 years ago.  Still has good appetite.  No recent weight changes.  She tried over-the-counter cough medication without any relief.  Had difficulty sleeping at night secondary to cough.  Past Medical History:  Diagnosis Date   Anxiety    aniety attacks   Bladder tumor    Colon polyps    COPD (chronic obstructive pulmonary disease) ([place])    09-08-2020 acute flare up with cough finished antibiotics and steroids cough resolved now   Depression    Hay fever    allergies   History of kidney stones    Migraines    not in 40 years   Wears glasses    Past Surgical History:  Procedure Laterality Date   [place]   caesarean secion  1974, 1977, 1984   [place]  [phone]   COLONOSCOPY W/ POLYPECTOMY  last done 2016    x 3    CYSTOSCOPY N/A 09/21/2020   Procedure: CYSTOSCOPY;  Surgeon: Robley Fries, MD;  Location: [facility];  Service: Urology;  Laterality: N/A;   CYSTOSCOPY W/ RETROGRADES Bilateral 02/04/2020   Procedure: CYSTOSCOPY WITH RETROGRADE PYELOGRAM;  Surgeon: Lucas Mallow, MD;  Location: [facility];  Service: Urology;  Laterality: Bilateral;   OPEN REDUCTION INTERNAL FIXATION (ORIF)  DISTAL RADIAL FRACTURE Right 11/03/2014   Procedure: OPEN REDUCTION INTERNAL FIXATION (ORIF) RIGHT DISTAL RADIAL FRACTURE WITH REPAIR AND RECONSTRUCTION;  Surgeon: Roseanne Kaufman, MD;  Location: [place];  Service: Orthopedics;  Laterality: Right;   TRANSURETHRAL RESECTION OF BLADDER TUMOR WITH MITOMYCIN-C N/A 02/04/2020   Procedure: TRANSURETHRAL RESECTION OF BLADDER TUMOR WITH POST OPERATIVE GEMCITABINE;  Surgeon: Lucas Mallow, MD;  Location: [facility];  Service: Urology;  Laterality: N/A;   TRANSURETHRAL RESECTION OF BLADDER TUMOR WITH MITOMYCIN-C N/A 09/21/2020   Procedure: CYSTOSCOPY WITH BLADDER FULGARATION WITH POST OP GEMCITABINE;  Surgeon: Robley Fries, MD;  Location: [place];  Service: Urology;  Laterality: N/A;  1 HR    reports that she quit smoking about 11 years ago. Her smoking use included cigarettes. She has a 75.00 pack-year smoking history. She has never used smokeless tobacco. She reports current alcohol use of about 2.0 standard drinks of alcohol per week. She reports that she does not use drugs. family history includes Arthritis in an other family member; Colon polyps in an other family member; Depression in an other family member; Hypertension in an other family member; Migraines in an other family member; Stroke in an other family member. Allergies  Allergen Reactions   Aspirin  REACTION: vasculitis    Review of Systems  Constitutional:  Negative for chills and fever.  HENT:  Negative for congestion, sinus pain and sore throat.   Respiratory:  Positive for cough. Negative for hemoptysis, sputum production and wheezing.   Cardiovascular:  Negative for chest pain and leg swelling.  Gastrointestinal:  Negative for abdominal pain.      Objective:     BP 122/70 (BP Location: Left Arm, Patient Position: Sitting, Cuff Size: Normal)   Pulse 94   Temp 98.4 F (36.9 C) (Oral)   Ht '5\' 7"'$  (1.702 m)   Wt 164 lb 3.2 oz (74.5 kg)    SpO2 96%   BMI 25.72 kg/m    Physical Exam Vitals reviewed.  Constitutional:      Appearance: Normal appearance.  HENT:     Right Ear: Tympanic membrane normal.     Left Ear: Tympanic membrane normal.     Mouth/Throat:     Mouth: Mucous membranes are moist.     Pharynx: Oropharynx is clear.  Cardiovascular:     Rate and Rhythm: Normal rate and regular rhythm.  Pulmonary:     Breath sounds: No wheezing.  Musculoskeletal:     Cervical back: Neck supple.     Right lower leg: No edema.     Left lower leg: No edema.  Lymphadenopathy:     Cervical: No cervical adenopathy.  Neurological:     Mental Status: She is alert.      No results found for any visits on 03/07/22.    The ASCVD Risk score (Arnett DK, et al., 2019) failed to calculate for the following reasons:   Cannot find a previous HDL lab   Cannot find a previous total cholesterol lab    Assessment & Plan:   Problem List Items Addressed This Visit   None Visit Diagnoses     Cough, unspecified type    -  Primary   Relevant Orders   DG Chest 2 View     Patient relates 1 week history of cough.  Suspect probably acute viral process.  She had difficulty with pneumonia in the past and we elected to go ahead and get chest x-ray given her severe cough to the point of gagging and emesis.  She has not had any obvious aspiration events.  -We did discuss cough suppression.  She has not responded well to Tessalon in the past.  We agreed to Hycodan cough syrup 1 teaspoon every 6 hours as needed for severe cough but caution about sedation. -Follow-up immediately for any fever, increased dyspnea, or persistent cough  No follow-ups on file.    Carolann Littler, MD

## 2022-03-10 ENCOUNTER — Telehealth: Payer: Self-pay | Admitting: Family Medicine

## 2022-03-10 MED ORDER — AMOXICILLIN 875 MG PO TABS: 875.0000 mg | ORAL_TABLET | Freq: Two times a day (BID) | ORAL | 0 refills | Status: AC

## 2022-03-10 MED ORDER — AZITHROMYCIN 250 MG PO TABS: ORAL_TABLET | ORAL | 0 refills | Status: AC

## 2022-03-10 NOTE — Telephone Encounter (Signed)
I spoke with the patient and she reported temperature of 99 degrees. Patient also reported diarrhea and Productive cough. Patient states she has no SOB or chest pain at this time. I advised patient to visit ED for any worsening symptoms or difficulty breathing. Patient is looking for advise on how to proceed.

## 2022-03-10 NOTE — Telephone Encounter (Signed)
Marcie Bal triage nurse is calling and she recommend the pt be seen within 24 hrs and pt saw dr Elease Hashimoto on 03-06-2022. Pt has low grade fever over 3 days , fatigue ,diarrhea and cough no color and pt has tested three times for covid and she in neg. Ambroise will tell pt to go to UC if she gets worst.

## 2022-03-10 NOTE — Addendum Note (Signed)
Addended by: Rosalee Kaufman L on: 03/10/2022 04:00 PM   Modules accepted: Orders

## 2022-03-10 NOTE — Telephone Encounter (Signed)
Rx sent and patient aware 

## 2022-03-24 DIAGNOSIS — Z23 Encounter for immunization: Secondary | ICD-10-CM | POA: Diagnosis not present

## 2022-04-18 DIAGNOSIS — H25813 Combined forms of age-related cataract, bilateral: Secondary | ICD-10-CM | POA: Diagnosis not present

## 2022-04-18 DIAGNOSIS — H52203 Unspecified astigmatism, bilateral: Secondary | ICD-10-CM | POA: Diagnosis not present

## 2022-04-18 DIAGNOSIS — H5213 Myopia, bilateral: Secondary | ICD-10-CM | POA: Diagnosis not present

## 2022-05-03 DIAGNOSIS — C678 Malignant neoplasm of overlapping sites of bladder: Secondary | ICD-10-CM | POA: Diagnosis not present

## 2022-05-03 DIAGNOSIS — R82998 Other abnormal findings in urine: Secondary | ICD-10-CM | POA: Diagnosis not present

## 2022-05-15 ENCOUNTER — Other Ambulatory Visit: Payer: Self-pay | Admitting: Family Medicine

## 2022-05-15 DIAGNOSIS — Z1231 Encounter for screening mammogram for malignant neoplasm of breast: Secondary | ICD-10-CM

## 2022-06-06 DIAGNOSIS — D485 Neoplasm of uncertain behavior of skin: Secondary | ICD-10-CM | POA: Diagnosis not present

## 2022-06-06 DIAGNOSIS — L988 Other specified disorders of the skin and subcutaneous tissue: Secondary | ICD-10-CM | POA: Diagnosis not present

## 2022-06-06 DIAGNOSIS — L821 Other seborrheic keratosis: Secondary | ICD-10-CM | POA: Diagnosis not present

## 2022-06-06 DIAGNOSIS — L439 Lichen planus, unspecified: Secondary | ICD-10-CM | POA: Diagnosis not present

## 2022-06-16 ENCOUNTER — Other Ambulatory Visit (HOSPITAL_BASED_OUTPATIENT_CLINIC_OR_DEPARTMENT_OTHER): Payer: Self-pay

## 2022-06-16 ENCOUNTER — Ambulatory Visit (INDEPENDENT_AMBULATORY_CARE_PROVIDER_SITE_OTHER): Payer: Medicare Other | Admitting: Family Medicine

## 2022-06-16 VITALS — BP 122/70 | HR 73 | Temp 98.3°F | Ht 67.0 in | Wt 163.1 lb

## 2022-06-16 DIAGNOSIS — C679 Malignant neoplasm of bladder, unspecified: Secondary | ICD-10-CM

## 2022-06-16 DIAGNOSIS — R053 Chronic cough: Secondary | ICD-10-CM | POA: Diagnosis not present

## 2022-06-16 MED ORDER — PREDNISONE 20 MG PO TABS
ORAL_TABLET | ORAL | 0 refills | Status: DC
  Filled 2022-06-16: qty 12, 6d supply, fill #0

## 2022-06-16 NOTE — Patient Instructions (Signed)
Let me know if cough not improved in one week on the prednisone  We might need to get CT chest if no better in one week.

## 2022-06-16 NOTE — Progress Notes (Signed)
Established Patient Office Visit  Subjective   Patient ID: [name], female    DOB: 05-Oct-1945  Age: 76 y.o. MRN: [id number]  Chief Complaint  Patient presents with   Sinus Problem   Cough    X 6 weeks, productive    HPI   [name] is seen with persistent cough.  Initially was listed as 6 weeks but she states perhaps this has been as long as September.  We saw her in September for cough.  She states that generally she feels well.  Does not have any dyspnea.  Cough productive of clear mucus.  Not aware of any obvious postnasal drip.  She does take chlorpheniramine daily.  No obvious GERD symptoms.  No hemoptysis.  No appetite or weight changes.  She does have a history of bladder cancer which was diagnosed couple years ago and her follow-up surveillance has been clear.  She had chest x-ray back in September which showed no acute findings.  No dyspnea.  She quit smoking 12 years ago.  Denies any acute or chronic sinusitis symptoms such as headache, facial pain, mucopurulent secretions, etc.  Past Medical History:  Diagnosis Date   Anxiety    aniety attacks   Bladder tumor    Colon polyps    COPD (chronic obstructive pulmonary disease) ([place])    09-08-2020 acute flare up with cough finished antibiotics and steroids cough resolved now   Depression    Hay fever    allergies   History of kidney stones    Migraines    not in 40 years   Wears glasses    Past Surgical History:  Procedure Laterality Date   [place]   caesarean secion  1974, 1977, 1984   [place]  [phone]   COLONOSCOPY W/ POLYPECTOMY  last done 2016    x 3    CYSTOSCOPY N/A 09/21/2020   Procedure: CYSTOSCOPY;  Surgeon: Robley Fries, MD;  Location: [facility];  Service: Urology;  Laterality: N/A;   CYSTOSCOPY W/ RETROGRADES Bilateral 02/04/2020   Procedure: CYSTOSCOPY WITH RETROGRADE PYELOGRAM;  Surgeon: Lucas Mallow, MD;  Location: [facility];   Service: Urology;  Laterality: Bilateral;   OPEN REDUCTION INTERNAL FIXATION (ORIF) DISTAL RADIAL FRACTURE Right 11/03/2014   Procedure: OPEN REDUCTION INTERNAL FIXATION (ORIF) RIGHT DISTAL RADIAL FRACTURE WITH REPAIR AND RECONSTRUCTION;  Surgeon: Roseanne Kaufman, MD;  Location: [place];  Service: Orthopedics;  Laterality: Right;   TRANSURETHRAL RESECTION OF BLADDER TUMOR WITH MITOMYCIN-C N/A 02/04/2020   Procedure: TRANSURETHRAL RESECTION OF BLADDER TUMOR WITH POST OPERATIVE GEMCITABINE;  Surgeon: Lucas Mallow, MD;  Location: [facility];  Service: Urology;  Laterality: N/A;   TRANSURETHRAL RESECTION OF BLADDER TUMOR WITH MITOMYCIN-C N/A 09/21/2020   Procedure: CYSTOSCOPY WITH BLADDER FULGARATION WITH POST OP GEMCITABINE;  Surgeon: Robley Fries, MD;  Location: [place];  Service: Urology;  Laterality: N/A;  1 HR    reports that she quit smoking about 12 years ago. Her smoking use included cigarettes. She has a 75.00 pack-year smoking history. She has never used smokeless tobacco. She reports current alcohol use of about 2.0 standard drinks of alcohol per week. She reports that she does not use drugs. family history includes Arthritis in an other family member; Colon polyps in an other family member; Depression in an other family member; Hypertension in an other family member; Migraines in an other family member; Stroke in an other  family member. Allergies  Allergen Reactions   Aspirin     REACTION: vasculitis    Review of Systems  Constitutional:  Negative for chills and fever.  HENT:  Negative for sinus pain.   Respiratory:  Positive for cough and sputum production. Negative for hemoptysis, shortness of breath and wheezing.   Cardiovascular:  Negative for chest pain.      Objective:     BP 122/70   Pulse 73   Temp 98.3 F (36.8 C) (Oral)   Ht '5\' 7"'$  (1.702 m)   Wt 163 lb 1 oz (74 kg)   SpO2 97%   BMI 25.54 kg/m  BP Readings from Last 3  Encounters:  06/16/22 122/70  03/07/22 122/70  07/18/21 110/62   Wt Readings from Last 3 Encounters:  06/16/22 163 lb 1 oz (74 kg)  03/07/22 164 lb 3.2 oz (74.5 kg)  07/18/21 163 lb 1.6 oz (74 kg)      Physical Exam Vitals reviewed.  Constitutional:      General: She is not in acute distress.    Appearance: Normal appearance. She is not ill-appearing.  Cardiovascular:     Rate and Rhythm: Normal rate and regular rhythm.  Pulmonary:     Effort: Pulmonary effort is normal.     Breath sounds: Normal breath sounds. No wheezing or rales.  Neurological:     Mental Status: She is alert.      No results found for any visits on 06/16/22.    The ASCVD Risk score (Arnett DK, et al., 2019) failed to calculate for the following reasons:   Cannot find a previous HDL lab   Cannot find a previous total cholesterol lab    Assessment & Plan:   Chronic cough now for several weeks.  Her cough goes back perhaps as long as September.  She does have past history of smoking over 12 years ago and also history of bladder cancer as above.  Does not any red flags such as appetite change, weight loss, hemoptysis, dyspnea and lung exam today is nonfocal.  Pulse oximetry 97%.  She is producing mucus daily which is clear  -We recommended trial of prednisone 20 mg 2 tablets daily for 6 days -Continue over-the-counter chlorpheniramine at night -If cough not significantly improved in 1 week be in touch and we will recommend CT chest especially with her history of smoking and history of bladder cancer----though she does not have any red flags, as above  Carolann Littler, MD

## 2022-06-23 ENCOUNTER — Encounter: Payer: Self-pay | Admitting: Family Medicine

## 2022-06-27 ENCOUNTER — Encounter: Payer: Self-pay | Admitting: Family Medicine

## 2022-06-27 NOTE — Telephone Encounter (Signed)
Noted  Tabathia Knoche W Hagen Tidd MD [facility]  

## 2022-07-08 DIAGNOSIS — Z87891 Personal history of nicotine dependence: Secondary | ICD-10-CM | POA: Diagnosis not present

## 2022-07-08 DIAGNOSIS — S62336A Displaced fracture of neck of fifth metacarpal bone, right hand, initial encounter for closed fracture: Secondary | ICD-10-CM | POA: Diagnosis not present

## 2022-07-08 DIAGNOSIS — S62396A Other fracture of fifth metacarpal bone, right hand, initial encounter for closed fracture: Secondary | ICD-10-CM | POA: Diagnosis not present

## 2022-07-08 DIAGNOSIS — Y9389 Activity, other specified: Secondary | ICD-10-CM | POA: Diagnosis not present

## 2022-07-08 DIAGNOSIS — Y9289 Other specified places as the place of occurrence of the external cause: Secondary | ICD-10-CM | POA: Diagnosis not present

## 2022-07-08 DIAGNOSIS — Y998 Other external cause status: Secondary | ICD-10-CM | POA: Diagnosis not present

## 2022-07-08 DIAGNOSIS — W1809XA Striking against other object with subsequent fall, initial encounter: Secondary | ICD-10-CM | POA: Diagnosis not present

## 2022-07-08 DIAGNOSIS — M85841 Other specified disorders of bone density and structure, right hand: Secondary | ICD-10-CM | POA: Diagnosis not present

## 2022-07-08 DIAGNOSIS — M79641 Pain in right hand: Secondary | ICD-10-CM | POA: Diagnosis not present

## 2022-07-10 DIAGNOSIS — S52501S Unspecified fracture of the lower end of right radius, sequela: Secondary | ICD-10-CM | POA: Diagnosis not present

## 2022-07-10 DIAGNOSIS — R52 Pain, unspecified: Secondary | ICD-10-CM | POA: Diagnosis not present

## 2022-07-10 DIAGNOSIS — S62306A Unspecified fracture of fifth metacarpal bone, right hand, initial encounter for closed fracture: Secondary | ICD-10-CM | POA: Diagnosis not present

## 2022-07-11 ENCOUNTER — Ambulatory Visit
Admission: RE | Admit: 2022-07-11 | Discharge: 2022-07-11 | Disposition: A | Discharge status: Home / Self Care | Payer: Medicare HMO | Source: Ambulatory Visit | Attending: Family Medicine | Admitting: Family Medicine

## 2022-07-11 DIAGNOSIS — S62326A Displaced fracture of shaft of fifth metacarpal bone, right hand, initial encounter for closed fracture: Secondary | ICD-10-CM | POA: Diagnosis not present

## 2022-07-11 DIAGNOSIS — Z1231 Encounter for screening mammogram for malignant neoplasm of breast: Secondary | ICD-10-CM | POA: Diagnosis not present

## 2022-07-11 HISTORY — PX: FINGER FRACTURE SURGERY: SHX638

## 2022-07-15 ENCOUNTER — Emergency Department (HOSPITAL_BASED_OUTPATIENT_CLINIC_OR_DEPARTMENT_OTHER): Payer: Medicare HMO

## 2022-07-15 ENCOUNTER — Inpatient Hospital Stay (HOSPITAL_BASED_OUTPATIENT_CLINIC_OR_DEPARTMENT_OTHER)
Admission: EM | Admit: 2022-07-15 | Discharge: 2022-07-27 | DRG: 982 | Disposition: A | Discharge status: Home Health | Payer: Medicare HMO | Attending: General Surgery | Admitting: General Surgery

## 2022-07-15 ENCOUNTER — Other Ambulatory Visit: Payer: Self-pay

## 2022-07-15 DIAGNOSIS — E876 Hypokalemia: Secondary | ICD-10-CM | POA: Diagnosis not present

## 2022-07-15 DIAGNOSIS — J449 Chronic obstructive pulmonary disease, unspecified: Secondary | ICD-10-CM | POA: Diagnosis present

## 2022-07-15 DIAGNOSIS — R059 Cough, unspecified: Secondary | ICD-10-CM | POA: Diagnosis not present

## 2022-07-15 DIAGNOSIS — K810 Acute cholecystitis: Principal | ICD-10-CM | POA: Diagnosis present

## 2022-07-15 DIAGNOSIS — E785 Hyperlipidemia, unspecified: Secondary | ICD-10-CM | POA: Diagnosis present

## 2022-07-15 DIAGNOSIS — K9189 Other postprocedural complications and disorders of digestive system: Secondary | ICD-10-CM | POA: Diagnosis not present

## 2022-07-15 DIAGNOSIS — Z1152 Encounter for screening for COVID-19: Secondary | ICD-10-CM

## 2022-07-15 DIAGNOSIS — K8012 Calculus of gallbladder with acute and chronic cholecystitis without obstruction: Secondary | ICD-10-CM | POA: Diagnosis present

## 2022-07-15 DIAGNOSIS — Z8249 Family history of ischemic heart disease and other diseases of the circulatory system: Secondary | ICD-10-CM

## 2022-07-15 DIAGNOSIS — Z8551 Personal history of malignant neoplasm of bladder: Secondary | ICD-10-CM

## 2022-07-15 DIAGNOSIS — G43909 Migraine, unspecified, not intractable, without status migrainosus: Secondary | ICD-10-CM | POA: Diagnosis present

## 2022-07-15 DIAGNOSIS — R109 Unspecified abdominal pain: Secondary | ICD-10-CM | POA: Diagnosis not present

## 2022-07-15 DIAGNOSIS — R079 Chest pain, unspecified: Secondary | ICD-10-CM | POA: Diagnosis not present

## 2022-07-15 DIAGNOSIS — M810 Age-related osteoporosis without current pathological fracture: Secondary | ICD-10-CM | POA: Diagnosis present

## 2022-07-15 DIAGNOSIS — J441 Chronic obstructive pulmonary disease with (acute) exacerbation: Secondary | ICD-10-CM

## 2022-07-15 DIAGNOSIS — I700 Atherosclerosis of aorta: Secondary | ICD-10-CM | POA: Diagnosis not present

## 2022-07-15 DIAGNOSIS — S62306D Unspecified fracture of fifth metacarpal bone, right hand, subsequent encounter for fracture with routine healing: Secondary | ICD-10-CM

## 2022-07-15 DIAGNOSIS — Z87891 Personal history of nicotine dependence: Secondary | ICD-10-CM

## 2022-07-15 DIAGNOSIS — Y838 Other surgical procedures as the cause of abnormal reaction of the patient, or of later complication, without mention of misadventure at the time of the procedure: Secondary | ICD-10-CM | POA: Diagnosis not present

## 2022-07-15 DIAGNOSIS — K660 Peritoneal adhesions (postprocedural) (postinfection): Secondary | ICD-10-CM | POA: Diagnosis present

## 2022-07-15 DIAGNOSIS — K8020 Calculus of gallbladder without cholecystitis without obstruction: Secondary | ICD-10-CM | POA: Diagnosis not present

## 2022-07-15 DIAGNOSIS — Z886 Allergy status to analgesic agent status: Secondary | ICD-10-CM

## 2022-07-15 DIAGNOSIS — K8000 Calculus of gallbladder with acute cholecystitis without obstruction: Secondary | ICD-10-CM | POA: Diagnosis present

## 2022-07-15 DIAGNOSIS — Z79899 Other long term (current) drug therapy: Secondary | ICD-10-CM

## 2022-07-15 DIAGNOSIS — R1011 Right upper quadrant pain: Secondary | ICD-10-CM | POA: Diagnosis not present

## 2022-07-15 DIAGNOSIS — F419 Anxiety disorder, unspecified: Secondary | ICD-10-CM | POA: Diagnosis present

## 2022-07-15 LAB — URINALYSIS, ROUTINE W REFLEX MICROSCOPIC
Bilirubin Urine: NEGATIVE
Glucose, UA: NEGATIVE mg/dL
Ketones, ur: 15 mg/dL — AB
Leukocytes,Ua: NEGATIVE
Nitrite: NEGATIVE
Specific Gravity, Urine: 1.025 (ref 1.005–1.030)
pH: 5.5 (ref 5.0–8.0)

## 2022-07-15 LAB — COMPREHENSIVE METABOLIC PANEL
ALT: 14 U/L (ref 0–44)
AST: 20 U/L (ref 15–41)
Albumin: 4.3 g/dL (ref 3.5–5.0)
Alkaline Phosphatase: 64 U/L (ref 38–126)
Anion gap: 12 (ref 5–15)
BUN: 14 mg/dL (ref 8–23)
CO2: 27 mmol/L (ref 22–32)
Calcium: 9.2 mg/dL (ref 8.9–10.3)
Chloride: 99 mmol/L (ref 98–111)
Creatinine, Ser: 0.79 mg/dL (ref 0.44–1.00)
GFR, Estimated: >60 mL/min (ref >60)
Glucose, Bld: 125 mg/dL — ABNORMAL HIGH (ref 70–99)
Potassium: 3.1 mmol/L — ABNORMAL LOW (ref 3.5–5.1)
Sodium: 138 mmol/L (ref 135–145)
Total Bilirubin: 1 mg/dL (ref 0.3–1.2)
Total Protein: 7.2 g/dL (ref 6.5–8.1)

## 2022-07-15 LAB — CBC WITH DIFFERENTIAL/PLATELET
Abs Immature Granulocytes: 0.06 10*3/uL (ref 0.00–0.07)
Basophils Absolute: 0 10*3/uL (ref 0.0–0.1)
Basophils Relative: 0 %
Eosinophils Absolute: 0 10*3/uL (ref 0.0–0.5)
Eosinophils Relative: 0 %
HCT: 44.2 % (ref 36.0–46.0)
Hemoglobin: 14.5 g/dL (ref 12.0–15.0)
Immature Granulocytes: 0 %
Lymphocytes Relative: 8 %
Lymphs Abs: 1.1 10*3/uL (ref 0.7–4.0)
MCH: 30.1 pg (ref 26.0–34.0)
MCHC: 32.8 g/dL (ref 30.0–36.0)
MCV: 91.9 fL (ref 80.0–100.0)
Monocytes Absolute: 1.6 10*3/uL — ABNORMAL HIGH (ref 0.1–1.0)
Monocytes Relative: 11 %
Neutro Abs: 11.1 10*3/uL — ABNORMAL HIGH (ref 1.7–7.7)
Neutrophils Relative %: 81 %
Platelets: 267 10*3/uL (ref 150–400)
RBC: 4.81 MIL/uL (ref 3.87–5.11)
RDW: 13.4 % (ref 11.5–15.5)
WBC: 13.9 10*3/uL — ABNORMAL HIGH (ref 4.0–10.5)
nRBC: 0 % (ref 0.0–0.2)

## 2022-07-15 LAB — LACTIC ACID, PLASMA: Lactic Acid, Venous: 0.9 mmol/L (ref 0.5–1.9)

## 2022-07-15 LAB — LIPASE, BLOOD: Lipase: <10 U/L — ABNORMAL LOW (ref 11–51)

## 2022-07-15 LAB — RESP PANEL BY RT-PCR (RSV, FLU A&B, COVID)  RVPGX2
Influenza A by PCR: NEGATIVE
Influenza B by PCR: NEGATIVE
Resp Syncytial Virus by PCR: NEGATIVE
SARS Coronavirus 2 by RT PCR: NEGATIVE

## 2022-07-15 MED ORDER — FENTANYL CITRATE PF 50 MCG/ML IJ SOSY
50.0000 ug | PREFILLED_SYRINGE | Freq: Once | INTRAMUSCULAR | Status: AC
  Administered 2022-07-15: 50 ug via INTRAVENOUS
  Filled 2022-07-15: qty 1

## 2022-07-15 MED ORDER — IOHEXOL 300 MG/ML  SOLN
100.0000 mL | Freq: Once | INTRAMUSCULAR | Status: AC | PRN
  Administered 2022-07-15: 80 mL via INTRAVENOUS

## 2022-07-15 MED ORDER — POTASSIUM CHLORIDE 10 MEQ/100ML IV SOLN
10.0000 meq | Freq: Once | INTRAVENOUS | Status: AC
  Administered 2022-07-15: 10 meq via INTRAVENOUS
  Filled 2022-07-15: qty 100

## 2022-07-15 MED ORDER — SODIUM CHLORIDE 0.9 % IV SOLN
12.5000 mg | Freq: Once | INTRAVENOUS | Status: DC
  Filled 2022-07-15: qty 0.5

## 2022-07-15 MED ORDER — SODIUM CHLORIDE 0.9 % IV BOLUS
1000.0000 mL | Freq: Once | INTRAVENOUS | Status: AC
  Administered 2022-07-15: 1000 mL via INTRAVENOUS

## 2022-07-15 MED ORDER — ONDANSETRON HCL 4 MG/2ML IJ SOLN
4.0000 mg | Freq: Once | INTRAMUSCULAR | Status: AC
  Administered 2022-07-15: 4 mg via INTRAVENOUS
  Filled 2022-07-15: qty 2

## 2022-07-15 MED ORDER — SODIUM CHLORIDE 0.9 % IV SOLN
2.0000 g | Freq: Once | INTRAVENOUS | Status: AC
  Administered 2022-07-15: 2 g via INTRAVENOUS
  Filled 2022-07-15: qty 20

## 2022-07-15 NOTE — ED Triage Notes (Signed)
Patient arrives with complaints of abdominal pain x1 day. Patient reports difficulty getting comfortable and nausea. Rates pain a 9/10.  No diarrhea/vomiting or urinary symptoms.

## 2022-07-15 NOTE — H&P (Incomplete)
[name] 1946/05/08  [id number].    Requesting MD: *** Chief Complaint/Reason for Consult: ***  HPI:  ***  ROS: ROS  Family History  Problem Relation Age of Onset   Arthritis Other    Stroke Other    Hypertension Other    Depression Other    Migraines Other    Colon polyps Other    Breast cancer Neg Hx     Past Medical History:  Diagnosis Date   Anxiety    aniety attacks   Bladder tumor    Colon polyps    COPD (chronic obstructive pulmonary disease) ([place])    09-08-2020 acute flare up with cough finished antibiotics and steroids cough resolved now   Depression    Hay fever    allergies   History of kidney stones    Migraines    not in 40 years   Wears glasses     Past Surgical History:  Procedure Laterality Date   [place]   caesarean secion  1974, 1977, 1984   [place]  [phone]   COLONOSCOPY W/ POLYPECTOMY  last done 2016    x 3    CYSTOSCOPY N/A 09/21/2020   Procedure: CYSTOSCOPY;  Surgeon: Robley Fries, MD;  Location: [facility];  Service: Urology;  Laterality: N/A;   CYSTOSCOPY W/ RETROGRADES Bilateral 02/04/2020   Procedure: CYSTOSCOPY WITH RETROGRADE PYELOGRAM;  Surgeon: Lucas Mallow, MD;  Location: [facility];  Service: Urology;  Laterality: Bilateral;   OPEN REDUCTION INTERNAL FIXATION (ORIF) DISTAL RADIAL FRACTURE Right 11/03/2014   Procedure: OPEN REDUCTION INTERNAL FIXATION (ORIF) RIGHT DISTAL RADIAL FRACTURE WITH REPAIR AND RECONSTRUCTION;  Surgeon: Roseanne Kaufman, MD;  Location: [place];  Service: Orthopedics;  Laterality: Right;   TRANSURETHRAL RESECTION OF BLADDER TUMOR WITH MITOMYCIN-C N/A 02/04/2020   Procedure: TRANSURETHRAL RESECTION OF BLADDER TUMOR WITH POST OPERATIVE GEMCITABINE;  Surgeon: Lucas Mallow, MD;  Location: [facility];  Service: Urology;  Laterality: N/A;   TRANSURETHRAL RESECTION OF BLADDER TUMOR WITH MITOMYCIN-C N/A 09/21/2020    Procedure: CYSTOSCOPY WITH BLADDER FULGARATION WITH POST OP GEMCITABINE;  Surgeon: Robley Fries, MD;  Location: [place];  Service: Urology;  Laterality: N/A;  1 HR    Social History:  reports that she quit smoking about 12 years ago. Her smoking use included cigarettes. She has a 75.00 pack-year smoking history. She has never used smokeless tobacco. She reports current alcohol use of about 2.0 standard drinks of alcohol per week. She reports that she does not use drugs.  Allergies:  Allergies  Allergen Reactions   Aspirin     REACTION: vasculitis    (Not in a hospital admission)    Physical Exam: Blood pressure 133/75, pulse 89, temperature 99.4 F (37.4 C), temperature source Oral, resp. rate 18, height '5\' 7"'$  (1.702 m), weight 74 kg, SpO2 94 %. ***  Results for orders placed or performed during the hospital encounter of 07/15/22 (from the past 48 hour(s))  CBC with Differential     Status: Abnormal   Collection Time: 07/15/22  5:08 PM  Result Value Ref Range   WBC 13.9 (H) 4.0 - 10.5 K/uL   RBC 4.81 3.87 - 5.11 MIL/uL   Hemoglobin 14.5 12.0 - 15.0 g/dL   HCT 44.2 36.0 - 46.0 %   MCV 91.9 80.0 - 100.0 fL   MCH 30.1 26.0 - 34.0 pg   MCHC 32.8 30.0 - 36.0  g/dL   RDW 13.4 11.5 - 15.5 %   Platelets 267 150 - 400 K/uL   nRBC 0.0 0.0 - 0.2 %   Neutrophils Relative % 81 %   Neutro Abs 11.1 (H) 1.7 - 7.7 K/uL   Lymphocytes Relative 8 %   Lymphs Abs 1.1 0.7 - 4.0 K/uL   Monocytes Relative 11 %   Monocytes Absolute 1.6 (H) 0.1 - 1.0 K/uL   Eosinophils Relative 0 %   Eosinophils Absolute 0.0 0.0 - 0.5 K/uL   Basophils Relative 0 %   Basophils Absolute 0.0 0.0 - 0.1 K/uL   Immature Granulocytes 0 %   Abs Immature Granulocytes 0.06 0.00 - 0.07 K/uL    Comment: Performed at [facility], [address] [place], [place] [address]  Comprehensive metabolic panel     Status: Abnormal   Collection Time: 07/15/22  5:08 PM  Result Value Ref  Range   Sodium 138 135 - 145 mmol/L   Potassium 3.1 (L) 3.5 - 5.1 mmol/L   Chloride 99 98 - 111 mmol/L   CO2 27 22 - 32 mmol/L   Glucose, Bld 125 (H) 70 - 99 mg/dL    Comment: Glucose reference range applies only to samples taken after fasting for at least 8 hours.   BUN 14 8 - 23 mg/dL   Creatinine, Ser 0.79 0.44 - 1.00 mg/dL   Calcium 9.2 8.9 - 10.3 mg/dL   Total Protein 7.2 6.5 - 8.1 g/dL   Albumin 4.3 3.5 - 5.0 g/dL   AST 20 15 - 41 U/L   ALT 14 0 - 44 U/L   Alkaline Phosphatase 64 38 - 126 U/L   Total Bilirubin 1.0 0.3 - 1.2 mg/dL   GFR, Estimated >60 >60 mL/min    Comment: (NOTE) Calculated using the CKD-EPI Creatinine Equation (2021)    Anion gap 12 5 - 15    Comment: Performed at [facility], [place], [place] [address]  Lipase, blood     Status: Abnormal   Collection Time: 07/15/22  5:08 PM  Result Value Ref Range   Lipase <10 (L) 11 - 51 U/L    Comment: Performed at [facility], [address] [place], [place] [address]  Urinalysis, Routine w reflex microscopic -Urine, Clean Catch     Status: Abnormal   Collection Time: 07/15/22  7:40 PM  Result Value Ref Range   Color, Urine YELLOW YELLOW   APPearance CLEAR CLEAR   Specific Gravity, Urine 1.025 1.005 - 1.030   pH 5.5 5.0 - 8.0   Glucose, UA NEGATIVE NEGATIVE mg/dL   Hgb urine dipstick MODERATE (A) NEGATIVE   Bilirubin Urine NEGATIVE NEGATIVE   Ketones, ur 15 (A) NEGATIVE mg/dL   Protein, ur TRACE (A) NEGATIVE mg/dL   Nitrite NEGATIVE NEGATIVE   Leukocytes,Ua NEGATIVE NEGATIVE   RBC / HPF 0-5 0 - 5 RBC/hpf   WBC, UA 0-5 0 - 5 WBC/hpf   Bacteria, UA RARE (A) NONE SEEN   Squamous Epithelial / HPF 0-5 0 - 5 /HPF   Mucus PRESENT     Comment: Performed at [facility], [address] [place],  [address]   CT Abdomen Pelvis W Contrast  Result Date: 07/15/2022 CLINICAL DATA:  Abdominal pain for 1 day.  Nausea EXAM: CT ABDOMEN AND  PELVIS WITH CONTRAST TECHNIQUE: Multidetector CT imaging of the abdomen and pelvis was performed using the standard protocol following bolus administration of intravenous contrast. RADIATION  DOSE REDUCTION: This exam was performed according to the departmental dose-optimization program which includes automated exposure control, adjustment of the mA and/or kV according to patient size and/or use of iterative reconstruction technique. CONTRAST:  49m OMNIPAQUE IOHEXOL 300 MG/ML  SOLN COMPARISON:  03/01/2022 and ultrasound 07/15/2022 FINDINGS: Lower chest: No acute abnormality. Hepatobiliary: Probable stones in the gallbladder neck with moderate diffuse gallbladder wall thickening and mucosal hyperenhancement. Adjacent fat stranding about the gallbladder. No biliary dilation. No solid hepatic lesion. Pancreas: Unremarkable. No pancreatic ductal dilatation or surrounding inflammatory changes. Spleen: Normal in size without focal abnormality. Adrenals/Urinary Tract: Normal adrenal glands. Nonobstructing left 3 mm stone in the mid left kidney. No hydronephrosis. Unremarkable bladder. Stomach/Bowel: Colonic diverticulosis without diverticulitis. Normal caliber large and small bowel. Moderate colonic stool load. Normal appendix. Vascular/Lymphatic: Aortic atherosclerosis. No enlarged abdominal or pelvic lymph nodes. Reproductive: Uterus and bilateral adnexa are unremarkable. Other: Small volume ascites in the pelvis. 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 Thoracolumbar spondylosis. No acute fracture. IMPRESSION: Stones within the gallbladder neck with gallbladder wall thickening and pericholecystic fluid/stranding. Findings are compatible with acute cholecystitis. Nonobstructing left nephrolithiasis. Colonic diverticulosis without diverticulitis. Aortic Atherosclerosis (ICD10-I70.0). L5 spondylolysis with grade 1 spondylolisthesis. Electronically Signed   By: TPlacido SouM.D.    On: 07/15/2022 19:35   DG Chest Portable 1 View  Result Date: 07/15/2022 CLINICAL DATA:  Cough, abdominal pain EXAM: PORTABLE CHEST 1 VIEW COMPARISON:  03/07/2022 FINDINGS: Lungs are clear.  No pleural effusion or pneumothorax. The heart is normal in size. IMPRESSION: No evidence of acute cardiopulmonary disease. Electronically Signed   By: SJulian HyM.D.   On: 07/15/2022 19:31   UKoreaAbdomen Limited RUQ (LIVER/GB)  Result Date: 07/15/2022 CLINICAL DATA:  Right upper quadrant abdominal pain. EXAM: ULTRASOUND ABDOMEN LIMITED RIGHT UPPER QUADRANT COMPARISON:  None Available. FINDINGS: Gallbladder: Distended containing multiple gallstones and sludge. Gallbladder wall thickening at 6 mm. Small amount of pericholecystic fluid. Positive sonographic Murphy sign noted by sonographer. Common bile duct: Not well delineated due to overlying bowel gas. There is no intrahepatic biliary ductal dilatation. Liver: No focal lesion identified. Within normal limits in parenchymal echogenicity. No capsular nodularity. Portal vein is patent on color Doppler imaging with normal direction of blood flow towards the liver. Other: None. IMPRESSION: 1. Sonographic findings consistent with acute cholecystitis. Distended gallbladder with multiple gallstones, gallbladder wall thickening and a positive sonographic Murphy sign. 2. Common bile duct is obscured and not well delineated. No intrahepatic biliary ductal dilatation. Electronically Signed   By: MKeith RakeM.D.   On: 07/15/2022 19:22      Assessment/Plan ***   FEN - *** VTE - *** ID - *** Admit - ***  SMichaelle Birks MD [facility] Hepatobiliary [facility] Pancreatic Surgery 07/15/22 8:15 PM

## 2022-07-15 NOTE — ED Provider Notes (Signed)
[facility] Provider Note   CSN: [id number] Arrival date & time: 07/15/22  1321     History  Chief Complaint  Patient presents with   Abdominal Pain   Nausea    [name] is a 77 y.o. female.  The history is provided by the patient and medical records. No language interpreter was used.  Abdominal Pain Pain location:  Epigastric and RUQ Pain quality: aching, bloating, dull, fullness and sharp   Pain radiates to:  Does not radiate Pain severity:  Severe Onset quality:  Gradual Duration:  1 day Timing:  Constant Progression:  Waxing and waning Chronicity:  New Context: not trauma   Relieved by:  Nothing Worsened by:  Palpation Ineffective treatments:  None tried Associated symptoms: anorexia, chills, cough, fatigue and nausea   Associated symptoms: no chest pain, no constipation, no diarrhea, no dysuria, no fever, no shortness of breath, no vaginal bleeding, no vaginal discharge and no vomiting        Home Medications Prior to Admission medications   Medication Sig Start Date End Date Taking? Authorizing Provider  FLUoxetine (PROZAC) 20 MG capsule TAKE 1 CAPSULE BY MOUTH EVERY DAY 07/29/21   Burchette, Alinda Sierras, MD  predniSONE (DELTASONE) 20 MG tablet Take two tablets by mouth once daily for 6 days 06/16/22   Eulas Post, MD      Allergies    Aspirin    Review of Systems   Review of Systems  Constitutional:  Positive for chills and fatigue. Negative for fever.  HENT:  Negative for congestion.   Eyes:  Negative for visual disturbance.  Respiratory:  Positive for cough. Negative for chest tightness, shortness of breath and wheezing.   Cardiovascular:  Negative for chest pain, palpitations and leg swelling.  Gastrointestinal:  Positive for abdominal pain, anorexia and nausea. Negative for blood in stool, constipation, diarrhea and vomiting.  Genitourinary:  Negative for dysuria, flank pain, frequency, vaginal  bleeding and vaginal discharge.  Musculoskeletal:  Negative for back pain, neck pain and neck stiffness.  Skin:  Negative for rash and wound.  Neurological:  Negative for weakness, light-headedness, numbness and headaches.  Psychiatric/Behavioral:  Negative for agitation and confusion.   All other systems reviewed and are negative.   Physical Exam Updated Vital Signs BP 131/80   Pulse 85   Temp 99.4 F (37.4 C) (Oral)   Resp 18   Ht '5\' 7"'$  (1.702 m)   Wt 74 kg   SpO2 95%   BMI 25.55 kg/m  Physical Exam Vitals and nursing note reviewed.  Constitutional:      General: She is not in acute distress.    Appearance: She is well-developed. She is not ill-appearing, toxic-appearing or diaphoretic.  HENT:     Head: Normocephalic and atraumatic.     Nose: No congestion or rhinorrhea.     Mouth/Throat:     Pharynx: No oropharyngeal exudate or posterior oropharyngeal erythema.  Eyes:     Extraocular Movements: Extraocular movements intact.     Conjunctiva/sclera: Conjunctivae normal.     Pupils: Pupils are equal, round, and reactive to light.  Cardiovascular:     Rate and Rhythm: Regular rhythm. Tachycardia present.     Heart sounds: No murmur heard. Pulmonary:     Effort: Pulmonary effort is normal. No respiratory distress.     Breath sounds: Rhonchi present. No wheezing or rales.  Chest:     Chest wall: No tenderness.  Abdominal:  General: Bowel sounds are normal. There is no distension.     Palpations: Abdomen is soft.     Tenderness: There is abdominal tenderness in the right upper quadrant and epigastric area. There is no right CVA tenderness or left CVA tenderness.  Musculoskeletal:        General: No swelling or tenderness.     Cervical back: Neck supple. No tenderness.     Right lower leg: No edema.     Left lower leg: No edema.  Skin:    General: Skin is warm and dry.     Capillary Refill: Capillary refill takes less than 2 seconds.     Findings: No erythema or  rash.  Neurological:     General: No focal deficit present.     Mental Status: She is alert.     Sensory: No sensory deficit.     Motor: No weakness.  Psychiatric:        Mood and Affect: Mood normal.     ED Results / Procedures / Treatments   Labs (all labs ordered are listed, but only abnormal results are displayed) Labs Reviewed  CBC WITH DIFFERENTIAL/PLATELET - Abnormal; Notable for the following components:      Result Value   WBC 13.9 (*)    Neutro Abs 11.1 (*)    Monocytes Absolute 1.6 (*)    All other components within normal limits  COMPREHENSIVE METABOLIC PANEL - Abnormal; Notable for the following components:   Potassium 3.1 (*)    Glucose, Bld 125 (*)    All other components within normal limits  LIPASE, BLOOD - Abnormal; Notable for the following components:   Lipase <10 (*)    All other components within normal limits  URINALYSIS, ROUTINE W REFLEX MICROSCOPIC - Abnormal; Notable for the following components:   Hgb urine dipstick MODERATE (*)    Ketones, ur 15 (*)    Protein, ur TRACE (*)    Bacteria, UA RARE (*)    All other components within normal limits  RESP PANEL BY RT-PCR (RSV, FLU A&B, COVID)  [id number]  LACTIC ACID, PLASMA  LACTIC ACID, PLASMA    EKG None  Radiology CT Abdomen Pelvis W Contrast  Result Date: 07/15/2022 CLINICAL DATA:  Abdominal pain for 1 day.  Nausea EXAM: CT ABDOMEN AND PELVIS WITH CONTRAST TECHNIQUE: Multidetector CT imaging of the abdomen and pelvis was performed using the standard protocol following bolus administration of intravenous contrast. RADIATION DOSE REDUCTION: This exam was performed according to the departmental dose-optimization program which includes automated exposure control, adjustment of the mA and/or kV according to patient size and/or use of iterative reconstruction technique. CONTRAST:  18m OMNIPAQUE IOHEXOL 300 MG/ML  SOLN COMPARISON:  03/01/2022 and ultrasound 07/15/2022 FINDINGS: Lower chest: No acute  abnormality. Hepatobiliary: Probable stones in the gallbladder neck with moderate diffuse gallbladder wall thickening and mucosal hyperenhancement. Adjacent fat stranding about the gallbladder. No biliary dilation. No solid hepatic lesion. Pancreas: Unremarkable. No pancreatic ductal dilatation or surrounding inflammatory changes. Spleen: Normal in size without focal abnormality. Adrenals/Urinary Tract: Normal adrenal glands. Nonobstructing left 3 mm stone in the mid left kidney. No hydronephrosis. Unremarkable bladder. Stomach/Bowel: Colonic diverticulosis without diverticulitis. Normal caliber large and small bowel. Moderate colonic stool load. Normal appendix. Vascular/Lymphatic: Aortic atherosclerosis. No enlarged abdominal or pelvic lymph nodes. Reproductive: Uterus and bilateral adnexa are unremarkable. Other: Small volume ascites in the pelvis. 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 Thoracolumbar spondylosis. No acute fracture. IMPRESSION: Stones  within the gallbladder neck with gallbladder wall thickening and pericholecystic fluid/stranding. Findings are compatible with acute cholecystitis. Nonobstructing left nephrolithiasis. Colonic diverticulosis without diverticulitis. Aortic Atherosclerosis (ICD10-I70.0). L5 spondylolysis with grade 1 spondylolisthesis. Electronically Signed   By: Placido Sou M.D.   On: 07/15/2022 19:35   DG Chest Portable 1 View  Result Date: 07/15/2022 CLINICAL DATA:  Cough, abdominal pain EXAM: PORTABLE CHEST 1 VIEW COMPARISON:  03/07/2022 FINDINGS: Lungs are clear.  No pleural effusion or pneumothorax. The heart is normal in size. IMPRESSION: No evidence of acute cardiopulmonary disease. Electronically Signed   By: Julian Hy M.D.   On: 07/15/2022 19:31   US Abdomen Limited RUQ (LIVER/GB)  Result Date: 07/15/2022 CLINICAL DATA:  Right upper quadrant abdominal pain. EXAM: ULTRASOUND ABDOMEN LIMITED RIGHT UPPER QUADRANT  COMPARISON:  None Available. FINDINGS: Gallbladder: Distended containing multiple gallstones and sludge. Gallbladder wall thickening at 6 mm. Small amount of pericholecystic fluid. Positive sonographic Murphy sign noted by sonographer. Common bile duct: Not well delineated due to overlying bowel gas. There is no intrahepatic biliary ductal dilatation. Liver: No focal lesion identified. Within normal limits in parenchymal echogenicity. No capsular nodularity. Portal vein is patent on color Doppler imaging with normal direction of blood flow towards the liver. Other: None. IMPRESSION: 1. Sonographic findings consistent with acute cholecystitis. Distended gallbladder with multiple gallstones, gallbladder wall thickening and a positive sonographic Murphy sign. 2. Common bile duct is obscured and not well delineated. No intrahepatic biliary ductal dilatation. Electronically Signed   By: Keith Rake M.D.   On: 07/15/2022 19:22    Procedures Procedures    CRITICAL CARE Performed by: Gwenyth Allegra Hanzel Pizzo Total critical care time: 35 minutes Critical care time was exclusive of separately billable procedures and treating other patients. Critical care was necessary to treat or prevent imminent or life-threatening deterioration. Critical care was time spent personally by me on the following activities: development of treatment plan with patient and/or surrogate as well as nursing, discussions with consultants, evaluation of patient's response to treatment, examination of patient, obtaining history from patient or surrogate, ordering and performing treatments and interventions, ordering and review of laboratory studies, ordering and review of radiographic studies, pulse oximetry and re-evaluation of patient's condition.   Medications Ordered in ED Medications  cefTRIAXone (ROCEPHIN) 2 g in sodium chloride 0.9 % 100 mL IVPB (has no administration in time range)  sodium chloride 0.9 % bolus 1,000 mL (has no  administration in time range)  potassium chloride 10 mEq in 100 mL IVPB (has no administration in time range)  fentaNYL (SUBLIMAZE) injection 50 mcg (50 mcg Intravenous Given 07/15/22 1933)  ondansetron (ZOFRAN) injection 4 mg (4 mg Intravenous Given 07/15/22 1935)  sodium chloride 0.9 % bolus 1,000 mL (0 mLs Intravenous Stopped 07/15/22 2038)  iohexol (OMNIPAQUE) 300 MG/ML solution 100 mL (80 mLs Intravenous Contrast Given 07/15/22 1916)    ED Course/ Medical Decision Making/ A&P                             Medical Decision Making Amount and/or Complexity of Data Reviewed Labs: ordered. Radiology: ordered.  Risk Prescription drug management. Decision regarding hospitalization.    [name] is a 77 y.o. female with a past medical history significant for hyperlipidemia, kidney stones, previous bladder cancer status post resection, migraines, and osteoporosis who presents with nausea, fatigue, cough, and severe abdominal pain.  According to patient family, she had surgery on her right hand  5 days ago and has been doing well with that.  She denies any pain in the arm or swelling in any extremities.  No history of DVT or PE.  She says that she has had cough on and off for months but has had some more coughing today but since yesterday has been having nausea and decreased appetite.  She reports she started having more nausea and pain in her abdomen that is worse in the epigastric and right upper quadrant areas.  She reports no history of gallbladder disease and denies any trauma.  Denies any skin changes or rash.  She reports she is still passing gas and burping more than normal.  She denies any constipation or diarrhea.  Denies any urinary changes or dysuria.  She had some mild chills but has not had any documented fevers.  She denies any chest pain or shortness of breath but was coughing during my initial evaluation.  Given lack of chest pain or shortness of breath, doubt PE at this time.    On exam, patient does have rhonchi in the bases of both lungs.  She does report history of pneumonias and bronchitis in the past.  She had significant tenderness in her epigastric area and right upper quadrant primarily.  Less tenderness in the lower abdomen or left hemiabdomen.  No flank or CVA tenderness bilaterally.  No midline tenderness of the back.  Legs nontender nonedematous.  She is in a wrap splint in her right hand and wrist but she was able to wiggle her fingers and had sensation and strength in them.  Good cap refill.  Arms are nontender and nonedematous.  Patient otherwise appears uncomfortable with her abdominal pain.  Patient had some screening labs in triage including a CBC and CMP.  Patient's leukocytosis was found to 13.9 with normal hemoglobin and platelets.  Metabolic panel shows some hypokalemia 3.1 but normal kidney and liver function.  Lipase not elevated.  Due to the cough we will get chest x-ray and check for COVID.  We will get a lactic acid.  Will get urinalysis with her chills and abdominal pain.  We will get both CT and" ultrasound and a chest x-ray to further evaluate.  Anticipate reassessment after workup to determine disposition.  7:52 PM Ultrasound and CT scan both show evidence of acute cholecystitis.  I called general surgery and spoke with Dr. Zenia Resides who recommends antibiotics and admission to general surgery service at [facility] for further management.  Will have her remain NPO and give a small amount of fluids and some IV potassium for the hypokalemia.  Patient be admitted for further management of acute cholecystitis.        Final Clinical Impression(s) / ED Diagnoses Final diagnoses:  Acute cholecystitis  Hypokalemia   Clinical Impression: 1. Acute cholecystitis   2. Hypokalemia     Disposition: Admit  This note was prepared with assistance of Dragon voice recognition software. Occasional wrong-word or sound-a-like substitutions may have  occurred due to the inherent limitations of voice recognition software.      Alece Koppel, Gwenyth Allegra, MD 07/15/22 2045

## 2022-07-16 ENCOUNTER — Encounter (HOSPITAL_COMMUNITY): Payer: Self-pay | Admitting: General Surgery

## 2022-07-16 DIAGNOSIS — R1011 Right upper quadrant pain: Secondary | ICD-10-CM | POA: Diagnosis not present

## 2022-07-16 DIAGNOSIS — E876 Hypokalemia: Secondary | ICD-10-CM | POA: Diagnosis not present

## 2022-07-16 DIAGNOSIS — Z8551 Personal history of malignant neoplasm of bladder: Secondary | ICD-10-CM | POA: Diagnosis not present

## 2022-07-16 DIAGNOSIS — F419 Anxiety disorder, unspecified: Secondary | ICD-10-CM | POA: Diagnosis not present

## 2022-07-16 DIAGNOSIS — K811 Chronic cholecystitis: Secondary | ICD-10-CM | POA: Diagnosis not present

## 2022-07-16 DIAGNOSIS — G43909 Migraine, unspecified, not intractable, without status migrainosus: Secondary | ICD-10-CM | POA: Diagnosis not present

## 2022-07-16 DIAGNOSIS — Z8249 Family history of ischemic heart disease and other diseases of the circulatory system: Secondary | ICD-10-CM | POA: Diagnosis not present

## 2022-07-16 DIAGNOSIS — Z1152 Encounter for screening for COVID-19: Secondary | ICD-10-CM | POA: Diagnosis not present

## 2022-07-16 DIAGNOSIS — R69 Illness, unspecified: Secondary | ICD-10-CM | POA: Diagnosis not present

## 2022-07-16 DIAGNOSIS — K9189 Other postprocedural complications and disorders of digestive system: Secondary | ICD-10-CM | POA: Diagnosis not present

## 2022-07-16 DIAGNOSIS — Z87891 Personal history of nicotine dependence: Secondary | ICD-10-CM | POA: Diagnosis not present

## 2022-07-16 DIAGNOSIS — K660 Peritoneal adhesions (postprocedural) (postinfection): Secondary | ICD-10-CM | POA: Diagnosis not present

## 2022-07-16 DIAGNOSIS — Z79899 Other long term (current) drug therapy: Secondary | ICD-10-CM | POA: Diagnosis not present

## 2022-07-16 DIAGNOSIS — E785 Hyperlipidemia, unspecified: Secondary | ICD-10-CM | POA: Diagnosis not present

## 2022-07-16 DIAGNOSIS — K8012 Calculus of gallbladder with acute and chronic cholecystitis without obstruction: Secondary | ICD-10-CM | POA: Diagnosis not present

## 2022-07-16 DIAGNOSIS — S62306D Unspecified fracture of fifth metacarpal bone, right hand, subsequent encounter for fracture with routine healing: Secondary | ICD-10-CM | POA: Diagnosis not present

## 2022-07-16 DIAGNOSIS — M810 Age-related osteoporosis without current pathological fracture: Secondary | ICD-10-CM | POA: Diagnosis not present

## 2022-07-16 DIAGNOSIS — J449 Chronic obstructive pulmonary disease, unspecified: Secondary | ICD-10-CM | POA: Diagnosis not present

## 2022-07-16 DIAGNOSIS — Z886 Allergy status to analgesic agent status: Secondary | ICD-10-CM | POA: Diagnosis not present

## 2022-07-16 DIAGNOSIS — Y838 Other surgical procedures as the cause of abnormal reaction of the patient, or of later complication, without mention of misadventure at the time of the procedure: Secondary | ICD-10-CM | POA: Diagnosis not present

## 2022-07-16 LAB — SURGICAL PCR SCREEN
MRSA, PCR: NEGATIVE
Staphylococcus aureus: NEGATIVE

## 2022-07-16 LAB — TROPONIN I (HIGH SENSITIVITY)
Troponin I (High Sensitivity): 8 ng/L (ref <18)
Troponin I (High Sensitivity): 7 ng/L (ref <18)

## 2022-07-16 MED ORDER — ONDANSETRON HCL 4 MG/2ML IJ SOLN
4.0000 mg | Freq: Four times a day (QID) | INTRAMUSCULAR | Status: DC | PRN
  Administered 2022-07-20: 4 mg via INTRAVENOUS
  Filled 2022-07-16: qty 2

## 2022-07-16 MED ORDER — MUPIROCIN 2 % EX OINT
1.0000 | TOPICAL_OINTMENT | Freq: Two times a day (BID) | CUTANEOUS | Status: AC
  Administered 2022-07-16 – 2022-07-20 (×6): 1 via NASAL
  Filled 2022-07-16 (×2): qty 22

## 2022-07-16 MED ORDER — LACTATED RINGERS IV SOLN: INTRAVENOUS | Status: AC

## 2022-07-16 MED ORDER — ACETAMINOPHEN 325 MG PO TABS
650.0000 mg | ORAL_TABLET | Freq: Four times a day (QID) | ORAL | Status: DC | PRN
  Administered 2022-07-17: 650 mg via ORAL
  Filled 2022-07-16: qty 2

## 2022-07-16 MED ORDER — MORPHINE SULFATE (PF) 4 MG/ML IV SOLN
4.0000 mg | Freq: Once | INTRAVENOUS | Status: AC
  Administered 2022-07-16: 4 mg via INTRAVENOUS
  Filled 2022-07-16: qty 1

## 2022-07-16 MED ORDER — POTASSIUM CHLORIDE IN NACL 20-0.9 MEQ/L-% IV SOLN
INTRAVENOUS | Status: DC
  Filled 2022-07-16 (×3): qty 1000

## 2022-07-16 MED ORDER — MORPHINE SULFATE (PF) 4 MG/ML IV SOLN
8.0000 mg | Freq: Once | INTRAVENOUS | Status: AC
  Administered 2022-07-16: 8 mg via INTRAVENOUS
  Filled 2022-07-16: qty 2

## 2022-07-16 MED ORDER — SODIUM CHLORIDE 0.9 % IV SOLN
2.0000 g | INTRAVENOUS | Status: AC
  Administered 2022-07-16 – 2022-07-22 (×7): 2 g via INTRAVENOUS
  Filled 2022-07-16 (×7): qty 20

## 2022-07-16 MED ORDER — MORPHINE SULFATE (PF) 4 MG/ML IV SOLN
6.0000 mg | Freq: Once | INTRAVENOUS | Status: AC
  Administered 2022-07-16: 6 mg via INTRAVENOUS
  Filled 2022-07-16: qty 2

## 2022-07-16 MED ORDER — ACETAMINOPHEN 650 MG RE SUPP: 650.0000 mg | Freq: Four times a day (QID) | RECTAL | Status: DC | PRN

## 2022-07-16 MED ORDER — ONDANSETRON 4 MG PO TBDP: 4.0000 mg | ORAL_TABLET | Freq: Four times a day (QID) | ORAL | Status: DC | PRN

## 2022-07-16 MED ORDER — MORPHINE SULFATE (PF) 4 MG/ML IV SOLN: 4.0000 mg | INTRAVENOUS | Status: DC | PRN

## 2022-07-16 MED ORDER — OXYCODONE HCL 5 MG PO TABS
5.0000 mg | ORAL_TABLET | ORAL | Status: DC | PRN
  Administered 2022-07-16: 5 mg via ORAL
  Administered 2022-07-17 – 2022-07-18 (×3): 10 mg via ORAL
  Administered 2022-07-18: 5 mg via ORAL
  Administered 2022-07-18 – 2022-07-23 (×7): 10 mg via ORAL
  Administered 2022-07-23: 5 mg via ORAL
  Administered 2022-07-24 (×5): 10 mg via ORAL
  Filled 2022-07-16 (×3): qty 2
  Filled 2022-07-16 (×2): qty 1
  Filled 2022-07-16 (×2): qty 2
  Filled 2022-07-16: qty 1
  Filled 2022-07-16 (×10): qty 2

## 2022-07-16 NOTE — ED Notes (Signed)
MD made aware of complaint of chest pain

## 2022-07-16 NOTE — H&P (Signed)
[name] is an 77 y.o. female.   Chief Complaint: RUQ pain HPI: 77yo F developed right upper quadrant pain with associated nausea on Friday night.  It persisted throughout Saturday.  She went for further evaluation at med center [facility].  Workup included ultrasound and CT scan of the abdomen pelvis and both showed acute cholecystitis with cholelithiasis.  Liver function tests and lipase are normal.  She was accepted in transfer to [facility] for surgical treatment.  Of note, she just had ORIF of her right wrist earlier this week by Dr. Amedeo Plenty.  Past Medical History:  Diagnosis Date   Anxiety    aniety attacks   Bladder tumor    Colon polyps    COPD (chronic obstructive pulmonary disease) ([place])    09-08-2020 acute flare up with cough finished antibiotics and steroids cough resolved now   Depression    Hay fever    allergies   History of kidney stones    Migraines    not in 40 years   Wears glasses     Past Surgical History:  Procedure Laterality Date   [place]   caesarean secion  1974, 1977, 1984   [place]  [phone]   COLONOSCOPY W/ POLYPECTOMY  last done 2016    x 3    CYSTOSCOPY N/A 09/21/2020   Procedure: CYSTOSCOPY;  Surgeon: Robley Fries, MD;  Location[facility];  Service: Urology;  Laterality: N/A;   CYSTOSCOPY W/ RETROGRADES Bilateral 02/04/2020   Procedure: CYSTOSCOPY WITH RETROGRADE PYELOGRAM;  Surgeon: Lucas Mallow, MD;  Location: [facility];  Service: Urology;  Laterality: Bilateral;   OPEN REDUCTION INTERNAL FIXATION (ORIF) DISTAL RADIAL FRACTURE Right 11/03/2014   Procedure: OPEN REDUCTION INTERNAL FIXATION (ORIF) RIGHT DISTAL RADIAL FRACTURE WITH REPAIR AND RECONSTRUCTION;  Surgeon: Roseanne Kaufman, MD;  Location: [place];  Service: Orthopedics;  Laterality: Right;   TRANSURETHRAL RESECTION OF BLADDER TUMOR WITH MITOMYCIN-C N/A 02/04/2020   Procedure: TRANSURETHRAL RESECTION OF BLADDER TUMOR  WITH POST OPERATIVE GEMCITABINE;  Surgeon: Lucas Mallow, MD;  Location: [facility];  Service: Urology;  Laterality: N/A;   TRANSURETHRAL RESECTION OF BLADDER TUMOR WITH MITOMYCIN-C N/A 09/21/2020   Procedure: CYSTOSCOPY WITH BLADDER FULGARATION WITH POST OP GEMCITABINE;  Surgeon: Robley Fries, MD;  Location: [place];  Service: Urology;  Laterality: N/A;  1 HR    Family History  Problem Relation Age of Onset   Arthritis Other    Stroke Other    Hypertension Other    Depression Other    Migraines Other    Colon polyps Other    Breast cancer Neg Hx    Social History:  reports that she quit smoking about 12 years ago. Her smoking use included cigarettes. She has a 75.00 pack-year smoking history. She has never used smokeless tobacco. She reports current alcohol use of about 2.0 standard drinks of alcohol per week. She reports that she does not use drugs.  Allergies:  Allergies  Allergen Reactions   Aspirin     REACTION: vasculitis    Medications Prior to Admission  Medication Sig Dispense Refill   FLUoxetine (PROZAC) 20 MG capsule TAKE 1 CAPSULE BY MOUTH EVERY DAY 90 capsule 3   predniSONE (DELTASONE) 20 MG tablet Take two tablets by mouth once daily for 6 days 12 tablet 0    Results for orders placed or performed during the hospital encounter of 07/15/22 (from the  past 48 hour(s))  CBC with Differential     Status: Abnormal   Collection Time: 07/15/22  5:08 PM  Result Value Ref Range   WBC 13.9 (H) 4.0 - 10.5 K/uL   RBC 4.81 3.87 - 5.11 MIL/uL   Hemoglobin 14.5 12.0 - 15.0 g/dL   HCT 44.2 36.0 - 46.0 %   MCV 91.9 80.0 - 100.0 fL   MCH 30.1 26.0 - 34.0 pg   MCHC 32.8 30.0 - 36.0 g/dL   RDW 13.4 11.5 - 15.5 %   Platelets 267 150 - 400 K/uL   nRBC 0.0 0.0 - 0.2 %   Neutrophils Relative % 81 %   Neutro Abs 11.1 (H) 1.7 - 7.7 K/uL   Lymphocytes Relative 8 %   Lymphs Abs 1.1 0.7 - 4.0 K/uL   Monocytes Relative 11 %   Monocytes  Absolute 1.6 (H) 0.1 - 1.0 K/uL   Eosinophils Relative 0 %   Eosinophils Absolute 0.0 0.0 - 0.5 K/uL   Basophils Relative 0 %   Basophils Absolute 0.0 0.0 - 0.1 K/uL   Immature Granulocytes 0 %   Abs Immature Granulocytes 0.06 0.00 - 0.07 K/uL    Comment: Performed at [facility], [place], [place] [address]  Comprehensive metabolic panel     Status: Abnormal   Collection Time: 07/15/22  5:08 PM  Result Value Ref Range   Sodium 138 135 - 145 mmol/L   Potassium 3.1 (L) 3.5 - 5.1 mmol/L   Chloride 99 98 - 111 mmol/L   CO2 27 22 - 32 mmol/L   Glucose, Bld 125 (H) 70 - 99 mg/dL    Comment: Glucose reference range applies only to samples taken after fasting for at least 8 hours.   BUN 14 8 - 23 mg/dL   Creatinine, Ser 0.79 0.44 - 1.00 mg/dL   Calcium 9.2 8.9 - 10.3 mg/dL   Total Protein 7.2 6.5 - 8.1 g/dL   Albumin 4.3 3.5 - 5.0 g/dL   AST 20 15 - 41 U/L   ALT 14 0 - 44 U/L   Alkaline Phosphatase 64 38 - 126 U/L   Total Bilirubin 1.0 0.3 - 1.2 mg/dL   GFR, Estimated >60 >60 mL/min    Comment: (NOTE) Calculated using the CKD-EPI Creatinine Equation (2021)    Anion gap 12 5 - 15    Comment: Performed at [facility], [place], [place] [address]  Lipase, blood     Status: Abnormal   Collection Time: 07/15/22  5:08 PM  Result Value Ref Range   Lipase <10 (L) 11 - 51 U/L    Comment: Performed at [facility], [address], [place], [place] [address]  Urinalysis, Routine w reflex microscopic -Urine, Clean Catch     Status: Abnormal   Collection Time: 07/15/22  7:40 PM  Result Value Ref Range   Color, Urine YELLOW YELLOW   APPearance CLEAR CLEAR   Specific Gravity, Urine 1.025 1.005 - 1.030   pH 5.5 5.0 - 8.0   Glucose, UA NEGATIVE NEGATIVE mg/dL   Hgb urine dipstick MODERATE (A) NEGATIVE   Bilirubin Urine NEGATIVE NEGATIVE   Ketones, ur 15 (A) NEGATIVE mg/dL   Protein, ur TRACE (A)  NEGATIVE mg/dL   Nitrite NEGATIVE NEGATIVE   Leukocytes,Ua NEGATIVE NEGATIVE   RBC / HPF 0-5 0 - 5 RBC/hpf   WBC, UA 0-5 0 - 5 WBC/hpf   Bacteria, UA RARE (A) NONE SEEN  Squamous Epithelial / HPF 0-5 0 - 5 /HPF   Mucus PRESENT     Comment: Performed at [facility], [address], [place], [place] [address]  Resp panel by RT-PCR (RSV, Flu A&B, Covid) Anterior Nasal Swab     Status: None   Collection Time: 07/15/22  7:41 PM   Specimen: Anterior Nasal Swab  Result Value Ref Range   SARS Coronavirus 2 by RT PCR NEGATIVE NEGATIVE    Comment: (NOTE) SARS-CoV-2 target nucleic acids are NOT DETECTED.  The SARS-CoV-2 RNA is generally detectable in upper respiratory specimens during the acute phase of infection. The lowest concentration of SARS-CoV-2 viral copies this assay can detect is 138 copies/mL. A negative result does not preclude SARS-Cov-2 infection and should not be used as the sole basis for treatment or other patient management decisions. A negative result may occur with  improper specimen collection/handling, submission of specimen other than nasopharyngeal swab, presence of viral mutation(s) within the areas targeted by this assay, and inadequate number of viral copies(<138 copies/mL). A negative result must be combined with clinical observations, patient history, and epidemiological information. The expected result is Negative.  Fact Sheet for Patients:  EntrepreneurPulse.com.au  Fact Sheet for Healthcare Providers:  IncredibleEmployment.be  This test is no t yet approved or cleared by the [place] FDA and  has been authorized for detection and/or diagnosis of SARS-CoV-2 by FDA under an Emergency Use Authorization (EUA). This EUA will remain  in effect (meaning this test can be used) for the duration of the COVID-19 declaration under Section 564(b)(1) of the Act, 21 U.S.C.section 360bbb-3(b)(1), unless the  authorization is terminated  or revoked sooner.       Influenza A by PCR NEGATIVE NEGATIVE   Influenza B by PCR NEGATIVE NEGATIVE    Comment: (NOTE) The Xpert Xpress SARS-CoV-2/FLU/RSV plus assay is intended as an aid in the diagnosis of influenza from Nasopharyngeal swab specimens and should not be used as a sole basis for treatment. Nasal washings and aspirates are unacceptable for Xpert Xpress SARS-CoV-2/FLU/RSV testing.  Fact Sheet for Patients: EntrepreneurPulse.com.au  Fact Sheet for Healthcare Providers: IncredibleEmployment.be  This test is not yet approved or cleared by the [place] FDA and has been authorized for detection and/or diagnosis of SARS-CoV-2 by FDA under an Emergency Use Authorization (EUA). This EUA will remain in effect (meaning this test can be used) for the duration of the COVID-19 declaration under Section 564(b)(1) of the Act, 21 U.S.C. section 360bbb-3(b)(1), unless the authorization is terminated or revoked.     Resp Syncytial Virus by PCR NEGATIVE NEGATIVE    Comment: (NOTE) Fact Sheet for Patients: EntrepreneurPulse.com.au  Fact Sheet for Healthcare Providers: IncredibleEmployment.be  This test is not yet approved or cleared by the [place] FDA and has been authorized for detection and/or diagnosis of SARS-CoV-2 by FDA under an Emergency Use Authorization (EUA). This EUA will remain in effect (meaning this test can be used) for the duration of the COVID-19 declaration under Section 564(b)(1) of the Act, 21 U.S.C. section 360bbb-3(b)(1), unless the authorization is terminated or revoked.  Performed at [facility], [place], [place] [address]   Lactic acid, plasma     Status: None   Collection Time: 07/15/22  7:48 PM  Result Value Ref Range   Lactic Acid, Venous 0.9 0.5 - 1.9 mmol/L    Comment: Performed at [facility], [place], [place] [address]  Troponin I (High Sensitivity)  Status: None   Collection Time: 07/16/22  3:58 AM  Result Value Ref Range   Troponin I (High Sensitivity) 8 <18 ng/L    Comment: (NOTE) Elevated high sensitivity troponin I (hsTnI) values and significant  changes across serial measurements may suggest ACS but many other  chronic and acute conditions are known to elevate hsTnI results.  Refer to the "Links" section for chest pain algorithms and additional  guidance. Performed at [facility], [place], [place] [address]   Troponin I (High Sensitivity)     Status: None   Collection Time: 07/16/22  5:58 AM  Result Value Ref Range   Troponin I (High Sensitivity) 7 <18 ng/L    Comment: (NOTE) Elevated high sensitivity troponin I (hsTnI) values and significant  changes across serial measurements may suggest ACS but many other  chronic and acute conditions are known to elevate hsTnI results.  Refer to the "Links" section for chest pain algorithms and additional  guidance. Performed at [facility], [address] [place], [place] [address]    CT Abdomen Pelvis W Contrast  Result Date: 07/15/2022 CLINICAL DATA:  Abdominal pain for 1 day.  Nausea EXAM: CT ABDOMEN AND PELVIS WITH CONTRAST TECHNIQUE: Multidetector CT imaging of the abdomen and pelvis was performed using the standard protocol following bolus administration of intravenous contrast. RADIATION DOSE REDUCTION: This exam was performed according to the departmental dose-optimization program which includes automated exposure control, adjustment of the mA and/or kV according to patient size and/or use of iterative reconstruction technique. CONTRAST:  65m OMNIPAQUE IOHEXOL 300 MG/ML  SOLN COMPARISON:  03/01/2022 and ultrasound 07/15/2022 FINDINGS: Lower chest: No acute abnormality. Hepatobiliary: Probable stones in the gallbladder  neck with moderate diffuse gallbladder wall thickening and mucosal hyperenhancement. Adjacent fat stranding about the gallbladder. No biliary dilation. No solid hepatic lesion. Pancreas: Unremarkable. No pancreatic ductal dilatation or surrounding inflammatory changes. Spleen: Normal in size without focal abnormality. Adrenals/Urinary Tract: Normal adrenal glands. Nonobstructing left 3 mm stone in the mid left kidney. No hydronephrosis. Unremarkable bladder. Stomach/Bowel: Colonic diverticulosis without diverticulitis. Normal caliber large and small bowel. Moderate colonic stool load. Normal appendix. Vascular/Lymphatic: Aortic atherosclerosis. No enlarged abdominal or pelvic lymph nodes. Reproductive: Uterus and bilateral adnexa are unremarkable. Other: Small volume ascites in the pelvis. 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 Thoracolumbar spondylosis. No acute fracture. IMPRESSION: Stones within the gallbladder neck with gallbladder wall thickening and pericholecystic fluid/stranding. Findings are compatible with acute cholecystitis. Nonobstructing left nephrolithiasis. Colonic diverticulosis without diverticulitis. Aortic Atherosclerosis (ICD10-I70.0). L5 spondylolysis with grade 1 spondylolisthesis. Electronically Signed   By: TPlacido SouM.D.   On: 07/15/2022 19:35   DG Chest Portable 1 View  Result Date: 07/15/2022 CLINICAL DATA:  Cough, abdominal pain EXAM: PORTABLE CHEST 1 VIEW COMPARISON:  03/07/2022 FINDINGS: Lungs are clear.  No pleural effusion or pneumothorax. The heart is normal in size. IMPRESSION: No evidence of acute cardiopulmonary disease. Electronically Signed   By: SJulian HyM.D.   On: 07/15/2022 19:31   [place]Abdomen Limited RUQ (LIVER/GB)  Result Date: 07/15/2022 CLINICAL DATA:  Right upper quadrant abdominal pain. EXAM: ULTRASOUND ABDOMEN LIMITED RIGHT UPPER QUADRANT COMPARISON:  None Available. FINDINGS: Gallbladder: Distended  containing multiple gallstones and sludge. Gallbladder wall thickening at 6 mm. Small amount of pericholecystic fluid. Positive sonographic Murphy sign noted by sonographer. Common bile duct: Not well delineated due to overlying bowel gas. There is no intrahepatic biliary ductal dilatation. Liver: No focal lesion  identified. Within normal limits in parenchymal echogenicity. No capsular nodularity. Portal vein is patent on color Doppler imaging with normal direction of blood flow towards the liver. Other: None. IMPRESSION: 1. Sonographic findings consistent with acute cholecystitis. Distended gallbladder with multiple gallstones, gallbladder wall thickening and a positive sonographic Murphy sign. 2. Common bile duct is obscured and not well delineated. No intrahepatic biliary ductal dilatation. Electronically Signed   By: Keith Rake M.D.   On: 07/15/2022 19:22    Review of Systems  Constitutional:  Positive for appetite change.  HENT: Negative.    Eyes: Negative.   Respiratory: Negative.    Cardiovascular: Negative.   Gastrointestinal:  Positive for abdominal pain and nausea.  Endocrine: Negative.   Genitourinary: Negative.   Musculoskeletal:        Wrist surgery ORIF earlier this week  Allergic/Immunologic: Negative.   Neurological: Negative.   Hematological: Negative.   Psychiatric/Behavioral: Negative.      Blood pressure 108/67, pulse 83, temperature 98.5 F (36.9 C), temperature source Oral, resp. rate (!) 21, height '5\' 7"'$  (1.702 m), weight 74 kg, SpO2 93 %. Physical Exam HENT:     Head: Normocephalic.  Cardiovascular:     Rate and Rhythm: Normal rate and regular rhythm.     Heart sounds: Normal heart sounds.  Pulmonary:     Effort: Pulmonary effort is normal.     Breath sounds: Normal breath sounds.  Abdominal:     General: Abdomen is flat.     Palpations: Abdomen is soft.     Tenderness: There is abdominal tenderness in the right upper quadrant.  Skin:    General: Skin  is warm.     Capillary Refill: Capillary refill takes 2 to 3 seconds.  Neurological:     Mental Status: She is alert and oriented to person, place, and time.  Psychiatric:        Mood and Affect: Mood normal.      Assessment/Plan Cholecystitis - IV Rocephin, admit to OBS, plan laparoscopic cholecystectomy by Dr. Marlou Starks tomorrow.  I discussed the procedure, risk, and benefits with the patient and her family.  I discussed the expected postoperative course.  She is agreeable.  Zenovia Jarred, MD 07/16/2022, 2:02 PM

## 2022-07-16 NOTE — ED Notes (Signed)
I have just given report to Larene Beach, RN with [facility].

## 2022-07-16 NOTE — ED Notes (Signed)
RT assessed pt for desaturation to mid 80's. Pt has hx of COPD w/out treatment. Pt placed on [place] 2 Lpm w/sats post placement 91%. Pt respiratory status stable w/no distress noted at this time.

## 2022-07-16 NOTE — Anesthesia Preprocedure Evaluation (Signed)
Anesthesia Evaluation  Patient identified by MRN, date of birth, ID band Patient awake    Reviewed: Allergy & Precautions, NPO status , Patient's Chart, lab work & pertinent test results  History of Anesthesia Complications Negative for: history of anesthetic complications  Airway Mallampati: II  TM Distance: >3 FB Neck ROM: Full    Dental  (+) Dental Advisory Given   Pulmonary COPD (steroids), former smoker   breath sounds clear to auscultation       Cardiovascular negative cardio ROS  Rhythm:Regular Rate:Normal     Neuro/Psych  Headaches  Anxiety Depression       GI/Hepatic Neg liver ROS,,,Acute chole   Endo/Other  negative endocrine ROS    Renal/GU H/o stones   H/o bladder tumor    Musculoskeletal   Abdominal   Peds  Hematology negative hematology ROS (+)   Anesthesia Other Findings   Reproductive/Obstetrics                             Anesthesia Physical Anesthesia Plan  ASA: 2  Anesthesia Plan: General   Post-op Pain Management: Tylenol PO (pre-op)*   Induction: Intravenous  PONV Risk Score and Plan: 3 and Ondansetron, Dexamethasone and Treatment may vary due to age or medical condition  Airway Management Planned: Oral ETT  Additional Equipment: None  Intra-op Plan:   Post-operative Plan: Extubation in OR  Informed Consent: I have reviewed the patients History and Physical, chart, labs and discussed the procedure including the risks, benefits and alternatives for the proposed anesthesia with the patient or authorized representative who has indicated his/her understanding and acceptance.     Dental advisory given  Plan Discussed with: CRNA and Surgeon  Anesthesia Plan Comments:        Anesthesia Quick Evaluation

## 2022-07-16 NOTE — ED Notes (Signed)
[facility] has been called for transfer

## 2022-07-17 ENCOUNTER — Observation Stay (HOSPITAL_COMMUNITY): Payer: Medicare HMO | Admitting: Anesthesiology

## 2022-07-17 ENCOUNTER — Other Ambulatory Visit: Payer: Self-pay

## 2022-07-17 ENCOUNTER — Observation Stay (HOSPITAL_BASED_OUTPATIENT_CLINIC_OR_DEPARTMENT_OTHER): Payer: Medicare HMO | Admitting: Anesthesiology

## 2022-07-17 ENCOUNTER — Encounter (HOSPITAL_COMMUNITY): Payer: Self-pay | Admitting: General Surgery

## 2022-07-17 ENCOUNTER — Encounter (HOSPITAL_COMMUNITY): Admission: EM | Disposition: A | Discharge status: Home Health | Payer: Self-pay | Source: Home / Self Care

## 2022-07-17 DIAGNOSIS — K8010 Calculus of gallbladder with chronic cholecystitis without obstruction: Secondary | ICD-10-CM | POA: Diagnosis not present

## 2022-07-17 DIAGNOSIS — K810 Acute cholecystitis: Secondary | ICD-10-CM | POA: Diagnosis not present

## 2022-07-17 HISTORY — PX: CHOLECYSTECTOMY: SHX55

## 2022-07-17 LAB — CBC
HCT: 37.6 % (ref 36.0–46.0)
Hemoglobin: 12 g/dL (ref 12.0–15.0)
MCH: 30.3 pg (ref 26.0–34.0)
MCHC: 31.9 g/dL (ref 30.0–36.0)
MCV: 94.9 fL (ref 80.0–100.0)
Platelets: 201 10*3/uL (ref 150–400)
RBC: 3.96 MIL/uL (ref 3.87–5.11)
RDW: 13.2 % (ref 11.5–15.5)
WBC: 9.9 10*3/uL (ref 4.0–10.5)
nRBC: 0 % (ref 0.0–0.2)

## 2022-07-17 LAB — POCT I-STAT, CHEM 8
BUN: 11 mg/dL (ref 8–23)
Calcium, Ion: 1.04 mmol/L — ABNORMAL LOW (ref 1.15–1.40)
Chloride: 104 mmol/L (ref 98–111)
Creatinine, Ser: 0.6 mg/dL (ref 0.44–1.00)
Glucose, Bld: 106 mg/dL — ABNORMAL HIGH (ref 70–99)
HCT: 39 % (ref 36.0–46.0)
Hemoglobin: 13.3 g/dL (ref 12.0–15.0)
Potassium: 3.1 mmol/L — ABNORMAL LOW (ref 3.5–5.1)
Sodium: 140 mmol/L (ref 135–145)
TCO2: 26 mmol/L (ref 22–32)

## 2022-07-17 LAB — COMPREHENSIVE METABOLIC PANEL
ALT: 21 U/L (ref 0–44)
AST: 29 U/L (ref 15–41)
Albumin: 2.6 g/dL — ABNORMAL LOW (ref 3.5–5.0)
Alkaline Phosphatase: 85 U/L (ref 38–126)
Anion gap: 11 (ref 5–15)
BUN: 8 mg/dL (ref 8–23)
CO2: 25 mmol/L (ref 22–32)
Calcium: 8 mg/dL — ABNORMAL LOW (ref 8.9–10.3)
Chloride: 102 mmol/L (ref 98–111)
Creatinine, Ser: 0.74 mg/dL (ref 0.44–1.00)
GFR, Estimated: >60 mL/min (ref >60)
Glucose, Bld: 106 mg/dL — ABNORMAL HIGH (ref 70–99)
Potassium: 2.8 mmol/L — ABNORMAL LOW (ref 3.5–5.1)
Sodium: 138 mmol/L (ref 135–145)
Total Bilirubin: 0.5 mg/dL (ref 0.3–1.2)
Total Protein: 5.3 g/dL — ABNORMAL LOW (ref 6.5–8.1)

## 2022-07-17 SURGERY — LAPAROSCOPIC CHOLECYSTECTOMY
Anesthesia: General | Site: Abdomen

## 2022-07-17 MED ORDER — BUPIVACAINE-EPINEPHRINE 0.5% -1:200000 IJ SOLN
INTRAMUSCULAR | Status: DC | PRN
  Administered 2022-07-17: 23 mL

## 2022-07-17 MED ORDER — ROCURONIUM BROMIDE 10 MG/ML (PF) SYRINGE
PREFILLED_SYRINGE | INTRAVENOUS | Status: AC
  Filled 2022-07-17: qty 10

## 2022-07-17 MED ORDER — PROPOFOL 10 MG/ML IV BOLUS
INTRAVENOUS | Status: AC
  Filled 2022-07-17: qty 20

## 2022-07-17 MED ORDER — OXYCODONE HCL 5 MG PO TABS: 5.0000 mg | ORAL_TABLET | Freq: Once | ORAL | Status: DC | PRN

## 2022-07-17 MED ORDER — FENTANYL CITRATE (PF) 250 MCG/5ML IJ SOLN
INTRAMUSCULAR | Status: AC
  Filled 2022-07-17: qty 5

## 2022-07-17 MED ORDER — ONDANSETRON HCL 4 MG/2ML IJ SOLN
INTRAMUSCULAR | Status: AC
  Filled 2022-07-17: qty 2

## 2022-07-17 MED ORDER — DEXAMETHASONE SODIUM PHOSPHATE 10 MG/ML IJ SOLN
INTRAMUSCULAR | Status: DC | PRN
  Administered 2022-07-17: 5 mg via INTRAVENOUS

## 2022-07-17 MED ORDER — OXYCODONE HCL 5 MG/5ML PO SOLN: 5.0000 mg | Freq: Once | ORAL | Status: DC | PRN

## 2022-07-17 MED ORDER — SODIUM CHLORIDE 0.9 % IR SOLN
Status: DC | PRN
  Administered 2022-07-17: 1000 mL

## 2022-07-17 MED ORDER — PHENYLEPHRINE 80 MCG/ML (10ML) SYRINGE FOR IV PUSH (FOR BLOOD PRESSURE SUPPORT)
PREFILLED_SYRINGE | INTRAVENOUS | Status: AC
  Filled 2022-07-17: qty 30

## 2022-07-17 MED ORDER — ROCURONIUM BROMIDE 10 MG/ML (PF) SYRINGE
PREFILLED_SYRINGE | INTRAVENOUS | Status: DC | PRN
  Administered 2022-07-17: 60 mg via INTRAVENOUS

## 2022-07-17 MED ORDER — LACTATED RINGERS IV SOLN: INTRAVENOUS | Status: DC

## 2022-07-17 MED ORDER — ONDANSETRON HCL 4 MG/2ML IJ SOLN
INTRAMUSCULAR | Status: DC | PRN
  Administered 2022-07-17: 4 mg via INTRAVENOUS

## 2022-07-17 MED ORDER — LIDOCAINE 2% (20 MG/ML) 5 ML SYRINGE
INTRAMUSCULAR | Status: AC
  Filled 2022-07-17: qty 5

## 2022-07-17 MED ORDER — PROPOFOL 10 MG/ML IV BOLUS
INTRAVENOUS | Status: DC | PRN
  Administered 2022-07-17: 110 mg via INTRAVENOUS

## 2022-07-17 MED ORDER — HYDROMORPHONE HCL 1 MG/ML IJ SOLN
0.2500 mg | INTRAMUSCULAR | Status: DC | PRN
  Administered 2022-07-17: 0.25 mg via INTRAVENOUS
  Administered 2022-07-17: 0.5 mg via INTRAVENOUS
  Administered 2022-07-17: 0.25 mg via INTRAVENOUS

## 2022-07-17 MED ORDER — BUPIVACAINE-EPINEPHRINE (PF) 0.5% -1:200000 IJ SOLN
INTRAMUSCULAR | Status: AC
  Filled 2022-07-17: qty 30

## 2022-07-17 MED ORDER — BUPIVACAINE HCL (PF) 0.25 % IJ SOLN
INTRAMUSCULAR | Status: AC
  Filled 2022-07-17: qty 30

## 2022-07-17 MED ORDER — PROMETHAZINE HCL 25 MG/ML IJ SOLN: 6.2500 mg | INTRAMUSCULAR | Status: DC | PRN

## 2022-07-17 MED ORDER — POTASSIUM CHLORIDE 10 MEQ/100ML IV SOLN
10.0000 meq | INTRAVENOUS | Status: AC
  Administered 2022-07-17: 10 meq via INTRAVENOUS
  Filled 2022-07-17: qty 100

## 2022-07-17 MED ORDER — LIDOCAINE 2% (20 MG/ML) 5 ML SYRINGE
INTRAMUSCULAR | Status: DC | PRN
  Administered 2022-07-17: 40 mg via INTRAVENOUS

## 2022-07-17 MED ORDER — POTASSIUM CHLORIDE 10 MEQ/100ML IV SOLN
10.0000 meq | Freq: Once | INTRAVENOUS | Status: AC
  Administered 2022-07-17: 10 meq via INTRAVENOUS
  Filled 2022-07-17: qty 100

## 2022-07-17 MED ORDER — ORAL CARE MOUTH RINSE: 15.0000 mL | Freq: Once | OROMUCOSAL | Status: DC

## 2022-07-17 MED ORDER — DEXAMETHASONE SODIUM PHOSPHATE 10 MG/ML IJ SOLN
INTRAMUSCULAR | Status: AC
  Filled 2022-07-17: qty 1

## 2022-07-17 MED ORDER — EPHEDRINE 5 MG/ML INJ
INTRAVENOUS | Status: AC
  Filled 2022-07-17: qty 5

## 2022-07-17 MED ORDER — MEPERIDINE HCL 25 MG/ML IJ SOLN: 6.2500 mg | INTRAMUSCULAR | Status: DC | PRN

## 2022-07-17 MED ORDER — CHLORHEXIDINE GLUCONATE 0.12 % MT SOLN
OROMUCOSAL | Status: AC
  Filled 2022-07-17: qty 15

## 2022-07-17 MED ORDER — PHENYLEPHRINE 80 MCG/ML (10ML) SYRINGE FOR IV PUSH (FOR BLOOD PRESSURE SUPPORT)
PREFILLED_SYRINGE | INTRAVENOUS | Status: DC | PRN
  Administered 2022-07-17: 160 ug via INTRAVENOUS
  Administered 2022-07-17: 80 ug via INTRAVENOUS
  Administered 2022-07-17 (×3): 160 ug via INTRAVENOUS
  Administered 2022-07-17: 80 ug via INTRAVENOUS

## 2022-07-17 MED ORDER — ACETAMINOPHEN 500 MG PO TABS
1000.0000 mg | ORAL_TABLET | Freq: Four times a day (QID) | ORAL | Status: DC
  Administered 2022-07-17 – 2022-07-27 (×36): 1000 mg via ORAL
  Filled 2022-07-17 (×38): qty 2

## 2022-07-17 MED ORDER — FENTANYL CITRATE (PF) 250 MCG/5ML IJ SOLN
INTRAMUSCULAR | Status: DC | PRN
  Administered 2022-07-17 (×2): 50 ug via INTRAVENOUS
  Administered 2022-07-17: 100 ug via INTRAVENOUS
  Administered 2022-07-17: 50 ug via INTRAVENOUS

## 2022-07-17 MED ORDER — POTASSIUM CHLORIDE 10 MEQ/100ML IV SOLN
10.0000 meq | INTRAVENOUS | Status: AC
  Administered 2022-07-17 (×4): 10 meq via INTRAVENOUS
  Filled 2022-07-17 (×4): qty 100

## 2022-07-17 MED ORDER — HYDROMORPHONE HCL 1 MG/ML IJ SOLN
INTRAMUSCULAR | Status: AC
  Filled 2022-07-17: qty 1

## 2022-07-17 MED ORDER — MORPHINE SULFATE (PF) 2 MG/ML IV SOLN
1.0000 mg | INTRAVENOUS | Status: DC | PRN
  Administered 2022-07-17 – 2022-07-20 (×6): 2 mg via INTRAVENOUS
  Filled 2022-07-17 (×6): qty 1

## 2022-07-17 MED ORDER — CHLORHEXIDINE GLUCONATE 0.12 % MT SOLN: 15.0000 mL | Freq: Once | OROMUCOSAL | Status: DC

## 2022-07-17 MED ORDER — HEMOSTATIC AGENTS (NO CHARGE) OPTIME
TOPICAL | Status: DC | PRN
  Administered 2022-07-17: 1 via TOPICAL

## 2022-07-17 MED ORDER — SUGAMMADEX SODIUM 200 MG/2ML IV SOLN
INTRAVENOUS | Status: DC | PRN
  Administered 2022-07-17: 200 mg via INTRAVENOUS

## 2022-07-17 MED ORDER — 0.9 % SODIUM CHLORIDE (POUR BTL) OPTIME
TOPICAL | Status: DC | PRN
  Administered 2022-07-17: 1000 mL

## 2022-07-17 MED ORDER — MIDAZOLAM HCL 2 MG/2ML IJ SOLN: 0.5000 mg | Freq: Once | INTRAMUSCULAR | Status: DC | PRN

## 2022-07-17 MED ORDER — MIDAZOLAM HCL 2 MG/2ML IJ SOLN
INTRAMUSCULAR | Status: AC
  Filled 2022-07-17: qty 2

## 2022-07-17 SURGICAL SUPPLY — 44 items
ADH SKN CLS APL DERMABOND .7 (GAUZE/BANDAGES/DRESSINGS) ×1
APL PRP STRL LF DISP 70% ISPRP (MISCELLANEOUS) ×1
APPLIER CLIP 5 13 M/L LIGAMAX5 (MISCELLANEOUS) ×1
APR CLP MED LRG 5 ANG JAW (MISCELLANEOUS) ×1
BAG COUNTER SPONGE SURGICOUNT (BAG) ×2 IMPLANT
BAG SPNG CNTER NS LX DISP (BAG) ×1
BIOPATCH RED 1 DISK 7.0 (GAUZE/BANDAGES/DRESSINGS) IMPLANT
BLADE CLIPPER SURG (BLADE) IMPLANT
CANISTER SUCT 3000ML PPV (MISCELLANEOUS) ×2 IMPLANT
CHLORAPREP W/TINT 26 (MISCELLANEOUS) ×2 IMPLANT
CLIP APPLIE 5 13 M/L LIGAMAX5 (MISCELLANEOUS) ×2 IMPLANT
COVER SURGICAL LIGHT HANDLE (MISCELLANEOUS) ×2 IMPLANT
DERMABOND ADVANCED .7 DNX12 (GAUZE/BANDAGES/DRESSINGS) ×2 IMPLANT
DRSG TEGADERM 4X4.75 (GAUZE/BANDAGES/DRESSINGS) IMPLANT
ELECT REM PT RETURN 9FT ADLT (ELECTROSURGICAL) ×1
ELECTRODE REM PT RTRN 9FT ADLT (ELECTROSURGICAL) ×2 IMPLANT
EVACUATOR SILICONE 100CC (DRAIN) IMPLANT
GLOVE BIO SURGEON STRL SZ7 (GLOVE) IMPLANT
GLOVE BIO SURGEON STRL SZ7.5 (GLOVE) ×2 IMPLANT
GLOVE BIOGEL PI IND STRL 7.0 (GLOVE) IMPLANT
GLOVE BIOGEL PI IND STRL 7.5 (GLOVE) IMPLANT
GOWN STRL REUS W/ TWL LRG LVL3 (GOWN DISPOSABLE) ×6 IMPLANT
GOWN STRL REUS W/TWL LRG LVL3 (GOWN DISPOSABLE) ×3
HEMOSTAT SNOW SURGICEL 2X4 (HEMOSTASIS) IMPLANT
KIT BASIN OR (CUSTOM PROCEDURE TRAY) ×2 IMPLANT
KIT TURNOVER KIT B (KITS) ×2 IMPLANT
NS IRRIG 1000ML POUR BTL (IV SOLUTION) ×2 IMPLANT
PAD ARMBOARD 7.5X6 YLW CONV (MISCELLANEOUS) ×2 IMPLANT
SCISSORS LAP 5X35 DISP (ENDOMECHANICALS) ×2 IMPLANT
SET IRRIG TUBING LAPAROSCOPIC (IRRIGATION / IRRIGATOR) ×2 IMPLANT
SET TUBE SMOKE EVAC HIGH FLOW (TUBING) ×2 IMPLANT
SHEARS HARMONIC ACE PLUS 36CM (ENDOMECHANICALS) IMPLANT
SLEEVE Z-THREAD 5X100MM (TROCAR) ×4 IMPLANT
SPECIMEN JAR SMALL (MISCELLANEOUS) ×2 IMPLANT
SUT ETHILON 2 0 FS 18 (SUTURE) IMPLANT
SUT MNCRL AB 4-0 PS2 18 (SUTURE) ×2 IMPLANT
SYS BAG RETRIEVAL 10MM (BASKET) ×1
SYSTEM BAG RETRIEVAL 10MM (BASKET) ×4 IMPLANT
TOWEL GREEN STERILE (TOWEL DISPOSABLE) ×2 IMPLANT
TOWEL GREEN STERILE FF (TOWEL DISPOSABLE) ×2 IMPLANT
TRAY LAPAROSCOPIC MC (CUSTOM PROCEDURE TRAY) ×2 IMPLANT
TROCAR BALLN 12MMX100 BLUNT (TROCAR) ×2 IMPLANT
TROCAR Z-THREAD OPTICAL 5X100M (TROCAR) ×2 IMPLANT
WATER STERILE IRR 1000ML POUR (IV SOLUTION) ×2 IMPLANT

## 2022-07-17 NOTE — Anesthesia Postprocedure Evaluation (Signed)
Anesthesia Post Note  Patient: [name]  Procedure(s) Performed: LAPAROSCOPIC SUBTOTAL CHOLECYSTECTOMY (Abdomen)     Patient location during evaluation: PACU Anesthesia Type: General Level of consciousness: patient cooperative, oriented and sedated Pain management: pain level controlled Vital Signs Assessment: post-procedure vital signs reviewed and stable Respiratory status: spontaneous breathing, nonlabored ventilation and respiratory function stable Cardiovascular status: blood pressure returned to baseline and stable Postop Assessment: no apparent nausea or vomiting Anesthetic complications: no   No notable events documented.  Last Vitals:  Vitals:   07/17/22 1345 07/17/22 1400  BP: 106/63 115/61  Pulse: 79 82  Resp: 19 (!) 23  Temp:    SpO2: 94% 93%    Last Pain:  Vitals:   07/17/22 1400  TempSrc:   PainSc: Asleep                 Lynn Recendiz,E. Leylani Duley

## 2022-07-17 NOTE — Transfer of Care (Signed)
Immediate Anesthesia Transfer of Care Note  Patient: [name]  Procedure(s) Performed: LAPAROSCOPIC SUBTOTAL CHOLECYSTECTOMY (Abdomen)  Patient Location: PACU  Anesthesia Type:General  Level of Consciousness: drowsy and patient cooperative  Airway & Oxygen Therapy: Patient Spontanous Breathing  Post-op Assessment: Report given to RN and Post -op Vital signs reviewed and stable  Post vital signs: Reviewed and stable  Last Vitals:  Vitals Value Taken Time  BP 108/63 07/17/22 1300  Temp    Pulse 83 07/17/22 1300  Resp 28 07/17/22 1300  SpO2 88 % 07/17/22 1300  Vitals shown include unvalidated device data.  Last Pain:  Vitals:   07/17/22 0753  TempSrc: Oral  PainSc:       Patients Stated Pain Goal: 0 (35/46/56 8127)  Complications: No notable events documented.

## 2022-07-17 NOTE — Interval H&P Note (Signed)
History and Physical Interval Note:  07/17/2022 8:26 AM  [name]  has presented today for surgery, with the diagnosis of Cholecystitis.  The various methods of treatment have been discussed with the patient and family. After consideration of risks, benefits and other options for treatment, the patient has consented to  Procedure(s): LAPAROSCOPIC CHOLECYSTECTOMY (N/A) as a surgical intervention.  The patient's history has been reviewed, patient examined, no change in status, stable for surgery.  I have reviewed the patient's chart and labs.  Questions were answered to the patient's satisfaction.     Autumn Messing III

## 2022-07-17 NOTE — Plan of Care (Signed)

## 2022-07-17 NOTE — Op Note (Signed)
07/17/2022  12:55 PM  PATIENT:  [name]  77 y.o. female  PRE-OPERATIVE DIAGNOSIS:  Cholecystitis with Colelithiasis  POST-OPERATIVE DIAGNOSIS:  Cholecystitis with cholelithiasis  PROCEDURE:  Procedure(s): LAPAROSCOPIC SUBTOTAL CHOLECYSTECTOMY (N/A)  SURGEON:  Surgeon(s) and Role:    * Jovita Kussmaul, MD - Primary  PHYSICIAN ASSISTANT:   ASSISTANTS: none   ANESTHESIA:   local and general  EBL:  20 mL   BLOOD ADMINISTERED:none  DRAINS: (1) Blake drain(s) in the gallbladder fossa    LOCAL MEDICATIONS USED:  MARCAINE     SPECIMEN:  Source of Specimen:  gallbladder  DISPOSITION OF SPECIMEN:  PATHOLOGY  COUNTS:  YES  TOURNIQUET:  * No tourniquets in log *  DICTATION: .Dragon Dictation  After informed consent was obtained the patient was brought to the operating room and placed in the supine position on the operating table.  After adequate induction of general anesthesia the patient's abdomen was prepped with ChloraPrep, allowed to dry, and draped in usual sterile manner.  An appropriate timeout was performed.  The area below the umbilicus was infiltrated with quarter percent Marcaine.  A small vertically oriented incision was made in the skin below the umbilicus with a 15 blade knife.  The incision was carried through the subcutaneous tissue bluntly with a hemostat until the linea alba was identified.  The linea alba was incised with the 15 blade knife and each site was grasped with Kocher clamps and elevated anteriorly.  The preperitoneal space was probed bluntly with a hemostat until the peritoneum was opened and access was gained to the abdominal cavity.  A 0 Vicryl pursestring stitch was placed in the fascia around the opening.  The Hassan cannula was placed through the opening and anchored in place with the pursestring stitch.  The abdomen was then insufflated with carbon dioxide without difficulty.  The laparoscope was inserted through the [facility] cannula and the right  upper quadrant was inspected.  There was a large extremely inflamed gallbladder that was identified.  Next the epigastric region was infiltrated with quarter percent Marcaine.  A small stab incision was made with a 15 blade knife.  A 5 mm port was placed through this incision into the abdominal cavity under direct vision.  2 sites were then chosen on the right upper quadrant for placement of other 5 mm ports.  These areas were infiltrated with quarter percent Marcaine.  Small stab incisions were made with a 15 blade knife.  5 mm ports were then placed bluntly through these incisions into the abdominal cavity under direct vision.  I was then able to bluntly dissect some omental adhesions away from the top and body of the gallbladder.  In order to grasp the gallbladder I had to aspirate it with the suction tip.  The gallbladder was then elevated anteriorly and superiorly.  The neck of the gallbladder area was densely adherent to the stomach and duodenum and I did not feel like I could safely separate the tube.  Because of this I elected to perform a subtotal cholecystectomy.  I chose a site on the lower body of the gallbladder where it was was safe to enter the gallbladder.  The gallbladder was then entered with the harmonic scalpel.  The anterior wall and top and some of the back wall of the gallbladder were removed sharply with the harmonic scalpel.  There were a few small stones that were evacuated.  The gallbladder once attached was then placed in a laparoscopic bag and  removed through the infraumbilical port.  The inner lining of the gallbladder was then fulgurated with the cautery until it was hemostatic.  I did not see any backflow of bile.  The bottom of the gallbladder was then packed with some Surgicel snow.  A 19 French round Blake drain was then brought into the abdominal cavity and out the lateralmost 5 mm port.  The drain was placed along the gallbladder fossa of the liver.  The drain was anchored to the  skin with a 3-0 nylon stitch.  The abdomen was then irrigated with copious amounts of saline until the effluent was clear.  The fascial defect at the infraumbilical port site was closed with the previously placed Vicryl pursestring stitch as well as with another 0 Vicryl figure-of-eight stitch.  The rest of the ports were then removed under direct vision and the gas was allowed to escape.  No other abnormalities were noted on general inspection of the abdominal cavity.  The skin incisions were closed with interrupted 4-0 Monocryl subcuticular stitches.  Dermabond dressings and sterile drain dressings were applied.  The patient tolerated the procedure well.  At the end of the case all needle sponge and instrument counts were correct.  The patient was then awakened and taken to recovery in stable condition.  PLAN OF CARE: Admit to inpatient   PATIENT DISPOSITION:  PACU - hemodynamically stable.   Delay start of Pharmacological VTE agent (>24hrs) due to surgical blood loss or risk of bleeding: no

## 2022-07-17 NOTE — Discharge Instructions (Signed)
[facility]  Please arrive at least 30 min before your appointment to complete your check in paperwork.  If you are unable to arrive 30 min prior to your appointment time we may have to cancel or reschedule you. LAPAROSCOPIC SURGERY: POST OP INSTRUCTIONS Always review your discharge instruction sheet given to you by the facility where your surgery was performed. IF YOU HAVE DISABILITY OR FAMILY LEAVE FORMS, YOU MUST BRING THEM TO THE OFFICE FOR PROCESSING.   DO NOT GIVE THEM TO YOUR DOCTOR.  PAIN CONTROL  First take acetaminophen (Tylenol) AND/or ibuprofen (Advil) to control your pain after surgery.  Follow directions on package.  Taking acetaminophen (Tylenol) and/or ibuprofen (Advil) regularly after surgery will help to control your pain and lower the amount of prescription pain medication you may need.  You should not take more than 4,000 mg (4 grams) of acetaminophen (Tylenol) in 24 hours.  You should not take ibuprofen (Advil), aleve, motrin, naprosyn or other NSAIDS if you have a history of stomach ulcers or chronic kidney disease.  A prescription for pain medication may be given to you upon discharge.  Take your pain medication as prescribed, if you still have uncontrolled pain after taking acetaminophen (Tylenol) or ibuprofen (Advil). Use ice packs to help control pain. If you need a refill on your pain medication, please contact your pharmacy.  They will contact our office to request authorization. Prescriptions will not be filled after 5pm or on week-ends.  HOME MEDICATIONS Take your usually prescribed medications unless otherwise directed.  DIET You should follow a light diet the first few days after arrival home.  Be sure to include lots of fluids daily. Avoid fatty, fried foods.   CONSTIPATION It is common to experience some constipation after surgery and if you are taking pain medication.  Increasing fluid intake and taking a stool softener (such as Colace)  will usually help or prevent this problem from occurring.  A mild laxative (Milk of Magnesia or Miralax) should be taken according to package instructions if there are no bowel movements after 48 hours.  WOUND/INCISION CARE Most patients will experience some swelling and bruising in the area of the incisions.  Ice packs will help.  Swelling and bruising can take several days to resolve.  Unless discharge instructions indicate otherwise, follow guidelines below  STERI-STRIPS - you may remove your outer bandages 48 hours after surgery, and you may shower at that time.  You have steri-strips (small skin tapes) in place directly over the incision.  These strips should be left on the skin for 7-10 days.   DERMABOND/SKIN GLUE - you may shower in 24 hours.  The glue will flake off over the next 2-3 weeks. Any sutures or staples will be removed at the office during your follow-up visit.  ACTIVITIES You may resume regular (light) daily activities beginning the next day--such as daily self-care, walking, climbing stairs--gradually increasing activities as tolerated.  You may have sexual intercourse when it is comfortable.  Refrain from any heavy lifting or straining until approved by your doctor. You may drive when you are no longer taking prescription pain medication, you can comfortably wear a seatbelt, and you can safely maneuver your car and apply brakes.  FOLLOW-UP You should see your doctor in the office for a follow-up appointment approximately 2-3 weeks after your surgery.  You should have been given your post-op/follow-up appointment when your surgery was scheduled.  If you did not receive a post-op/follow-up appointment, make sure   that you call for this appointment within a day or two after you arrive home to insure a convenient appointment time.   WHEN TO CALL YOUR DOCTOR: Fever over 101.0 Inability to urinate Continued bleeding from incision. Increased pain, redness, or drainage from the  incision. Increasing abdominal pain  The clinic staff is available to answer your questions during regular business hours.  Please don't hesitate to call and ask to speak to one of the nurses for clinical concerns.  If you have a medical emergency, go to the nearest emergency room or call 911.  A surgeon from [facility] is always on call at the hospital. [address], [address], [place], [place]  [address] ? [address] [address], [place],    [address] [phone] ? [phone] ? FAX [phone]  

## 2022-07-17 NOTE — Progress Notes (Signed)
Dr. Glennon Mac anesthesia notified on pt's K 2.8. Order for repeat Istat.

## 2022-07-17 NOTE — Progress Notes (Signed)
Labs resulted on patient. K is 2.8. Trauma paged.

## 2022-07-17 NOTE — Anesthesia Procedure Notes (Signed)
Procedure Name: Intubation Date/Time: 07/17/2022 11:09 AM  Performed by: Lance Coon, CRNAPre-anesthesia Checklist: Patient identified, Emergency Drugs available, Suction available, Patient being monitored and Timeout performed Patient Re-evaluated:Patient Re-evaluated prior to induction Oxygen Delivery Method: Circle system utilized Preoxygenation: Pre-oxygenation with 100% oxygen Induction Type: IV induction Ventilation: Mask ventilation without difficulty Laryngoscope Size: Miller and 3 Tube type: Oral Tube size: 7.0 mm Number of attempts: 1 Airway Equipment and Method: Stylet Placement Confirmation: ETT inserted through vocal cords under direct vision, positive ETCO2 and breath sounds checked- equal and bilateral Secured at: 21 cm Tube secured with: Tape Dental Injury: Teeth and Oropharynx as per pre-operative assessment

## 2022-07-18 ENCOUNTER — Encounter (HOSPITAL_COMMUNITY): Payer: Self-pay | Admitting: General Surgery

## 2022-07-18 DIAGNOSIS — Z8551 Personal history of malignant neoplasm of bladder: Secondary | ICD-10-CM | POA: Diagnosis not present

## 2022-07-18 DIAGNOSIS — R0902 Hypoxemia: Secondary | ICD-10-CM | POA: Diagnosis not present

## 2022-07-18 DIAGNOSIS — Z87891 Personal history of nicotine dependence: Secondary | ICD-10-CM | POA: Diagnosis not present

## 2022-07-18 DIAGNOSIS — Z79899 Other long term (current) drug therapy: Secondary | ICD-10-CM | POA: Diagnosis not present

## 2022-07-18 DIAGNOSIS — K660 Peritoneal adhesions (postprocedural) (postinfection): Secondary | ICD-10-CM | POA: Diagnosis not present

## 2022-07-18 DIAGNOSIS — R0602 Shortness of breath: Secondary | ICD-10-CM | POA: Diagnosis not present

## 2022-07-18 DIAGNOSIS — K838 Other specified diseases of biliary tract: Secondary | ICD-10-CM | POA: Diagnosis not present

## 2022-07-18 DIAGNOSIS — Y838 Other surgical procedures as the cause of abnormal reaction of the patient, or of later complication, without mention of misadventure at the time of the procedure: Secondary | ICD-10-CM | POA: Diagnosis not present

## 2022-07-18 DIAGNOSIS — K8000 Calculus of gallbladder with acute cholecystitis without obstruction: Secondary | ICD-10-CM | POA: Diagnosis present

## 2022-07-18 DIAGNOSIS — Z886 Allergy status to analgesic agent status: Secondary | ICD-10-CM | POA: Diagnosis not present

## 2022-07-18 DIAGNOSIS — F419 Anxiety disorder, unspecified: Secondary | ICD-10-CM | POA: Diagnosis present

## 2022-07-18 DIAGNOSIS — K839 Disease of biliary tract, unspecified: Secondary | ICD-10-CM | POA: Diagnosis not present

## 2022-07-18 DIAGNOSIS — S62306D Unspecified fracture of fifth metacarpal bone, right hand, subsequent encounter for fracture with routine healing: Secondary | ICD-10-CM | POA: Diagnosis not present

## 2022-07-18 DIAGNOSIS — K832 Perforation of bile duct: Secondary | ICD-10-CM | POA: Diagnosis not present

## 2022-07-18 DIAGNOSIS — R69 Illness, unspecified: Secondary | ICD-10-CM | POA: Diagnosis not present

## 2022-07-18 DIAGNOSIS — J449 Chronic obstructive pulmonary disease, unspecified: Secondary | ICD-10-CM | POA: Diagnosis not present

## 2022-07-18 DIAGNOSIS — Z8249 Family history of ischemic heart disease and other diseases of the circulatory system: Secondary | ICD-10-CM | POA: Diagnosis not present

## 2022-07-18 DIAGNOSIS — K8689 Other specified diseases of pancreas: Secondary | ICD-10-CM | POA: Diagnosis not present

## 2022-07-18 DIAGNOSIS — R1011 Right upper quadrant pain: Secondary | ICD-10-CM | POA: Diagnosis not present

## 2022-07-18 DIAGNOSIS — G43909 Migraine, unspecified, not intractable, without status migrainosus: Secondary | ICD-10-CM | POA: Diagnosis not present

## 2022-07-18 DIAGNOSIS — K8020 Calculus of gallbladder without cholecystitis without obstruction: Secondary | ICD-10-CM | POA: Diagnosis not present

## 2022-07-18 DIAGNOSIS — K8012 Calculus of gallbladder with acute and chronic cholecystitis without obstruction: Secondary | ICD-10-CM | POA: Diagnosis not present

## 2022-07-18 DIAGNOSIS — E876 Hypokalemia: Secondary | ICD-10-CM | POA: Diagnosis not present

## 2022-07-18 DIAGNOSIS — K9189 Other postprocedural complications and disorders of digestive system: Secondary | ICD-10-CM | POA: Diagnosis not present

## 2022-07-18 DIAGNOSIS — E785 Hyperlipidemia, unspecified: Secondary | ICD-10-CM | POA: Diagnosis not present

## 2022-07-18 DIAGNOSIS — M810 Age-related osteoporosis without current pathological fracture: Secondary | ICD-10-CM | POA: Diagnosis not present

## 2022-07-18 DIAGNOSIS — J9811 Atelectasis: Secondary | ICD-10-CM | POA: Diagnosis not present

## 2022-07-18 DIAGNOSIS — F418 Other specified anxiety disorders: Secondary | ICD-10-CM | POA: Diagnosis not present

## 2022-07-18 DIAGNOSIS — Z1152 Encounter for screening for COVID-19: Secondary | ICD-10-CM | POA: Diagnosis not present

## 2022-07-18 DIAGNOSIS — Z9049 Acquired absence of other specified parts of digestive tract: Secondary | ICD-10-CM | POA: Diagnosis not present

## 2022-07-18 LAB — COMPREHENSIVE METABOLIC PANEL
ALT: 24 U/L (ref 0–44)
AST: 35 U/L (ref 15–41)
Albumin: 2.5 g/dL — ABNORMAL LOW (ref 3.5–5.0)
Alkaline Phosphatase: 97 U/L (ref 38–126)
Anion gap: 12 (ref 5–15)
BUN: 10 mg/dL (ref 8–23)
CO2: 24 mmol/L (ref 22–32)
Calcium: 8.1 mg/dL — ABNORMAL LOW (ref 8.9–10.3)
Chloride: 105 mmol/L (ref 98–111)
Creatinine, Ser: 0.66 mg/dL (ref 0.44–1.00)
GFR, Estimated: >60 mL/min (ref >60)
Glucose, Bld: 111 mg/dL — ABNORMAL HIGH (ref 70–99)
Potassium: 4 mmol/L (ref 3.5–5.1)
Sodium: 141 mmol/L (ref 135–145)
Total Bilirubin: 0.4 mg/dL (ref 0.3–1.2)
Total Protein: 5.3 g/dL — ABNORMAL LOW (ref 6.5–8.1)

## 2022-07-18 LAB — CBC
HCT: 35.1 % — ABNORMAL LOW (ref 36.0–46.0)
Hemoglobin: 11.1 g/dL — ABNORMAL LOW (ref 12.0–15.0)
MCH: 30.3 pg (ref 26.0–34.0)
MCHC: 31.6 g/dL (ref 30.0–36.0)
MCV: 95.9 fL (ref 80.0–100.0)
Platelets: 196 10*3/uL (ref 150–400)
RBC: 3.66 MIL/uL — ABNORMAL LOW (ref 3.87–5.11)
RDW: 13.2 % (ref 11.5–15.5)
WBC: 7.4 10*3/uL (ref 4.0–10.5)
nRBC: 0 % (ref 0.0–0.2)

## 2022-07-18 LAB — SURGICAL PATHOLOGY

## 2022-07-18 MED ORDER — ENOXAPARIN SODIUM 40 MG/0.4ML IJ SOSY
40.0000 mg | PREFILLED_SYRINGE | INTRAMUSCULAR | Status: DC
  Administered 2022-07-18 – 2022-07-26 (×8): 40 mg via SUBCUTANEOUS
  Filled 2022-07-18 (×8): qty 0.4

## 2022-07-18 MED ORDER — FLUOXETINE HCL 20 MG PO CAPS
20.0000 mg | ORAL_CAPSULE | Freq: Every day | ORAL | Status: DC
  Administered 2022-07-18 – 2022-07-27 (×10): 20 mg via ORAL
  Filled 2022-07-18 (×10): qty 1

## 2022-07-18 MED ORDER — ENOXAPARIN SODIUM 40 MG/0.4ML IJ SOSY: 40.0000 mg | PREFILLED_SYRINGE | INTRAMUSCULAR | Status: DC

## 2022-07-18 NOTE — Progress Notes (Signed)
Orthopedic Tech Progress Note Patient Details:  [name] May 18, 1946 [id number]  Ortho Devices Type of Ortho Device: Abdominal binder Ortho Device/Splint Location: STOMACH Ortho Device/Splint Interventions: Ordered   Post Interventions Patient Tolerated: Well Instructions Provided: Care of device  Janit Pagan 07/18/2022, 1:47 PM

## 2022-07-18 NOTE — Progress Notes (Signed)
Progress Note  1 Day Post-Op  Subjective: Having some post op abdominal soreness up to 8/10 but goes down to 2/10 with pain meds. Tolerating clears without n/v/abd pain. Has been oob to bathroom. On O2 this am but no SHOB   Objective: Vital signs in last 24 hours: Temp:  [96.5 F (35.8 C)-98.6 F (37 C)] 96.5 F (35.8 C) (01/30 0354) Pulse Rate:  [64-86] 64 (01/30 0354) Resp:  [16-28] 16 (01/30 0354) BP: (106-140)/(61-85) 140/63 (01/30 0354) SpO2:  [87 %-94 %] 94 % (01/30 0354)    Intake/Output from previous day: 01/29 0701 - 01/30 0700 In: 3049.3 [I.V.:2487.4; IV Piggyback:436.9] Out: 595 [Urine:525; Drains:50; Blood:20] Intake/Output this shift: No intake/output data recorded.  PE: General: pleasant, WD, female who is laying in bed in NAD Lungs: CTAB. Respiratory effort nonlabored on 2 lpm [place] supp O2. Pulling 750 on IS Abd: soft, ND, +BS, mild TTP over incisions and around drain - greatest around drain. Incisions c/d/I. Drain with SS fluid MSK: all 4 extremities are symmetrical with no cyanosis, clubbing, or edema. Skin: warm and dry  Psych: A&Ox3 with an appropriate affect.    Lab Results:  Recent Labs    07/17/22 0430 07/17/22 0806 07/18/22 0709  WBC 9.9  --  7.4  HGB 12.0 13.3 11.1*  HCT 37.6 39.0 35.1*  PLT 201  --  196   BMET Recent Labs    07/17/22 0430 07/17/22 0806 07/18/22 0709  NA 138 140 141  K 2.8* 3.1* 4.0  CL 102 104 105  CO2 25  --  24  GLUCOSE 106* 106* 111*  BUN '8 11 10  '$ CREATININE 0.74 0.60 0.66  CALCIUM 8.0*  --  8.1*   PT/INR No results for input(s): "LABPROT", "INR" in the last 72 hours. CMP     Component Value Date/Time   NA 141 07/18/2022 0709   K 4.0 07/18/2022 0709   CL 105 07/18/2022 0709   CO2 24 07/18/2022 0709   GLUCOSE 111 (H) 07/18/2022 0709   BUN 10 07/18/2022 0709   CREATININE 0.66 07/18/2022 0709   CALCIUM 8.1 (L) 07/18/2022 0709   PROT 5.3 (L) 07/18/2022 0709   ALBUMIN 2.5 (L) 07/18/2022 0709   AST 35  07/18/2022 0709   ALT 24 07/18/2022 0709   ALKPHOS 97 07/18/2022 0709   BILITOT 0.4 07/18/2022 0709   GFRNONAA >60 07/18/2022 0709   GFRAA  02/09/2010 1100    >60        The eGFR has been calculated using the MDRD equation. This calculation has not been validated in all clinical situations. eGFR's persistently <60 mL/min signify possible Chronic Kidney Disease.   Lipase     Component Value Date/Time   LIPASE <10 (L) 07/15/2022 1708       Studies/Results: No results found.  Anti-infectives: Anti-infectives (From admission, onward)    Start     Dose/Rate Route Frequency Ordered Stop   07/16/22 1600  cefTRIAXone (ROCEPHIN) 2 g in sodium chloride 0.9 % 100 mL IVPB        2 g 200 mL/hr over 30 Minutes Intravenous Every 24 hours 07/16/22 1433 07/23/22 1559   07/15/22 2000  cefTRIAXone (ROCEPHIN) 2 g in sodium chloride 0.9 % 100 mL IVPB        2 g 200 mL/hr over 30 Minutes Intravenous  Once 07/15/22 1955 07/15/22 2115        Assessment/Plan  Cholecystitis with Cholelithiasis POD1 s/p laparoscopic subtotal cholecystectomy 1/29 Dr. Marlou Starks  -  continue IV abx for now - drain 50 ml/24h charted. Continue drain and monitor output - tolerating CLD, advance to fulls - encouraged ambulation and PT ordered. Encouraged IS use  FEN: FLD ID: rocephin VTE: lovenox  COPD - not on O2 at baseline, wean O2 Anxiety - home med Recent 5th metacarpal fx - s/p surgery. Follow up with ortho as scheduled   LOS: 0 days   Winferd Humphrey, [facility] 07/18/2022, 8:25 AM Please see Amion for pager number during day hours 7:00am-4:30pm

## 2022-07-18 NOTE — Evaluation (Signed)
Physical Therapy Evaluation Patient Details Name: [name] MRN: [id number] DOB: 04-12-1946 Today's Date: 07/18/2022  History of Present Illness  77 y.o. female presents to [facility] 07/15/2022 with RUQ pain and nausea. CT abdomen demonstrates acute cholecystitis. PT underwent lap cholecystectomy on 1/29. PMH includes anxiety, bladder tumor, COPD, depression, migraines, recent R wrist ORIF.  Clinical Impression  Pt presents to PT with deficits in functional mobility, gait, balance, cardiopulmonary function. Pt is able to ambulate for household distances at this time with no overt losses of balance, although pt reports feeling wobbly. Pt does desaturate when ambulating on room air, PT encourages frequent mobilization in an effort to improve pulmonary function. PT will continue to follow in an effort to restore independence.       Recommendations for follow up therapy are one component of a multi-disciplinary discharge planning process, led by the attending physician.  Recommendations may be updated based on patient status, additional functional criteria and insurance authorization.  Follow Up Recommendations No PT follow up      Assistance Recommended at Discharge PRN  Patient can return home with the following  A little help with bathing/dressing/bathroom;Assistance with cooking/housework;Help with stairs or ramp for entrance;Assist for transportation    Equipment Recommendations None recommended by PT  Recommendations for Other Services       Functional Status Assessment Patient has had a recent decline in their functional status and demonstrates the ability to make significant improvements in function in a reasonable and predictable amount of time.     Precautions / Restrictions Precautions Precautions: Fall Precaution Comments: JP drain abdomen Restrictions Weight Bearing Restrictions: Yes RUE Weight Bearing: Weight bear through elbow only Other Position/Activity  Restrictions: assumed WBAT through elbow, NWB through hand/wrist      Mobility  Bed Mobility Overal bed mobility: Needs Assistance Bed Mobility: Supine to Sit, Sit to Supine     Supine to sit: Supervision, HOB elevated Sit to supine: Supervision        Transfers Overall transfer level: Needs assistance Equipment used: None Transfers: Sit to/from Stand Sit to Stand: Supervision           General transfer comment: cues to maintain NWB through R hand/wrist    Ambulation/Gait Ambulation/Gait assistance: Supervision Gait Distance (Feet): 250 Feet Assistive device: None Gait Pattern/deviations: Step-through pattern, Drifts right/left Gait velocity: reduced Gait velocity interpretation: 1.31 - 2.62 ft/sec, indicative of limited community ambulator   General Gait Details: mild lateral drift, ambulation distance limited by SOB  Stairs            Wheelchair Mobility    Modified Rankin (Stroke Patients Only)       Balance Overall balance assessment: Needs assistance Sitting-balance support: No upper extremity supported, Feet supported Sitting balance-Leahy Scale: Good     Standing balance support: No upper extremity supported, During functional activity Standing balance-Leahy Scale: Good                               Pertinent Vitals/Pain Pain Assessment Pain Assessment: 0-10 Pain Score: 5  Pain Location: abdomen Pain Descriptors / Indicators: Sore Pain Intervention(s): Premedicated before session    Home Living Family/patient expects to be discharged to:: Private residence Living Arrangements: Spouse/significant other;Children Available Help at Discharge: Family;Available 24 hours/day Type of Home: House Home Access: Stairs to enter Entrance Stairs-Rails: Right Entrance Stairs-Number of Steps: 4   Home Layout: Able to live on main level with  bedroom/bathroom Home Equipment: Conservation officer, nature (2 wheels);Cane - single point;BSC/3in1       Prior Function Prior Level of Function : Independent/Modified Independent;Driving             Mobility Comments: recently tripped when making bed, broke R wrist. Typically independent       Hand Dominance   Dominant Hand: Right    Extremity/Trunk Assessment   Upper Extremity Assessment Upper Extremity Assessment: RUE deficits/detail RUE Deficits / Details: R wrist splint    Lower Extremity Assessment Lower Extremity Assessment: Overall WFL for tasks assessed    Cervical / Trunk Assessment Cervical / Trunk Assessment: Normal  Communication   Communication: No difficulties  Cognition Arousal/Alertness: Awake/alert Behavior During Therapy: WFL for tasks assessed/performed Overall Cognitive Status: Within Functional Limits for tasks assessed                                          General Comments General comments (skin integrity, edema, etc.): pt on 1L [place] upon PT arrival, pt desats to 82% when ambulating on room air, reporting DOE and with difficulty conversing when ambulating. Pt recovers to 91% with 2L [place], PT weans back to 1L [place] before departure    Exercises     Assessment/Plan    PT Assessment Patient needs continued PT services  PT Problem List Decreased balance;Decreased activity tolerance;Decreased mobility;Cardiopulmonary status limiting activity       PT Treatment Interventions DME instruction;Gait training;Functional mobility training;Stair training;Therapeutic activities;Therapeutic exercise;Balance training;Patient/family education    PT Goals (Current goals can be found in the Care Plan section)  Acute Rehab PT Goals Patient Stated Goal: to return to independence PT Goal Formulation: With patient Time For Goal Achievement: 08/01/22 Potential to Achieve Goals: Good Additional Goals Additional Goal #1: Pt will score >19/24 on the DGI to indicate a reduced risk for falls    Frequency Min 3X/week     Co-evaluation                AM-PAC PT "6 Clicks" Mobility  Outcome Measure Help needed turning from your back to your side while in a flat bed without using bedrails?: A Little Help needed moving from lying on your back to sitting on the side of a flat bed without using bedrails?: A Little Help needed moving to and from a bed to a chair (including a wheelchair)?: A Little Help needed standing up from a chair using your arms (e.g., wheelchair or bedside chair)?: A Little Help needed to walk in hospital room?: A Little Help needed climbing 3-5 steps with a railing? : A Little 6 Click Score: 18    End of Session Equipment Utilized During Treatment: Oxygen Activity Tolerance: Patient tolerated treatment well Patient left: in bed;with call bell/phone within reach Nurse Communication: Mobility status PT Visit Diagnosis: Other abnormalities of gait and mobility (R26.89)    Time: 5929-2446 PT Time Calculation (min) (ACUTE ONLY): 20 min   Charges:   PT Evaluation $PT Eval Low Complexity: [place], PT, DPT Acute Rehabilitation Office [phone]   Zenaida Niece 07/18/2022, 1:51 PM

## 2022-07-18 NOTE — Progress Notes (Signed)
Trauma Md paged for lab orders.

## 2022-07-18 NOTE — Plan of Care (Signed)

## 2022-07-19 ENCOUNTER — Inpatient Hospital Stay (HOSPITAL_COMMUNITY): Payer: Medicare HMO

## 2022-07-19 LAB — CBC
HCT: 37.5 % (ref 36.0–46.0)
Hemoglobin: 12.2 g/dL (ref 12.0–15.0)
MCH: 30.7 pg (ref 26.0–34.0)
MCHC: 32.5 g/dL (ref 30.0–36.0)
MCV: 94.2 fL (ref 80.0–100.0)
Platelets: 293 10*3/uL (ref 150–400)
RBC: 3.98 MIL/uL (ref 3.87–5.11)
RDW: 13.2 % (ref 11.5–15.5)
WBC: 6.4 10*3/uL (ref 4.0–10.5)
nRBC: 0 % (ref 0.0–0.2)

## 2022-07-19 LAB — COMPREHENSIVE METABOLIC PANEL
ALT: 26 U/L (ref 0–44)
AST: 26 U/L (ref 15–41)
Albumin: 2.7 g/dL — ABNORMAL LOW (ref 3.5–5.0)
Alkaline Phosphatase: 91 U/L (ref 38–126)
Anion gap: 9 (ref 5–15)
BUN: 5 mg/dL — ABNORMAL LOW (ref 8–23)
CO2: 27 mmol/L (ref 22–32)
Calcium: 8.1 mg/dL — ABNORMAL LOW (ref 8.9–10.3)
Chloride: 104 mmol/L (ref 98–111)
Creatinine, Ser: 0.79 mg/dL (ref 0.44–1.00)
GFR, Estimated: >60 mL/min (ref >60)
Glucose, Bld: 96 mg/dL (ref 70–99)
Potassium: 3.1 mmol/L — ABNORMAL LOW (ref 3.5–5.1)
Sodium: 140 mmol/L (ref 135–145)
Total Bilirubin: 0.3 mg/dL (ref 0.3–1.2)
Total Protein: 5.5 g/dL — ABNORMAL LOW (ref 6.5–8.1)

## 2022-07-19 LAB — MAGNESIUM: Magnesium: 1.9 mg/dL (ref 1.7–2.4)

## 2022-07-19 MED ORDER — ALBUTEROL SULFATE (2.5 MG/3ML) 0.083% IN NEBU: 2.5000 mg | INHALATION_SOLUTION | RESPIRATORY_TRACT | Status: DC | PRN

## 2022-07-19 MED ORDER — IPRATROPIUM-ALBUTEROL 0.5-2.5 (3) MG/3ML IN SOLN
3.0000 mL | RESPIRATORY_TRACT | Status: DC
  Administered 2022-07-19: 3 mL via RESPIRATORY_TRACT
  Filled 2022-07-19: qty 3

## 2022-07-19 MED ORDER — POTASSIUM CHLORIDE CRYS ER 20 MEQ PO TBCR
20.0000 meq | EXTENDED_RELEASE_TABLET | Freq: Three times a day (TID) | ORAL | Status: DC
  Administered 2022-07-19 – 2022-07-21 (×7): 20 meq via ORAL
  Filled 2022-07-19 (×8): qty 1

## 2022-07-19 MED ORDER — IOHEXOL 350 MG/ML SOLN
75.0000 mL | Freq: Once | INTRAVENOUS | Status: AC | PRN
  Administered 2022-07-19: 75 mL via INTRAVENOUS

## 2022-07-19 NOTE — Progress Notes (Signed)
Progress Note  2 Days Post-Op  Subjective: Tolerating full liquids without n/v abdominal pain. Passing flatus. No BM. Ambulating. On O2 this am without SHOB but did have some SHOB with exertion yesterday. Pain controlled with help of abdominal binder.  Objective: Vital signs in last 24 hours: Temp:  [97.5 F (36.4 C)-98 F (36.7 C)] 97.8 F (36.6 C) (01/31 0419) Pulse Rate:  [57-78] 68 (01/31 0419) Resp:  [18-20] 20 (01/31 0419) BP: (123-132)/(65-89) 132/75 (01/31 0419) SpO2:  [92 %-94 %] 92 % (01/31 0419) Last BM Date : 07/14/22  Intake/Output from previous day: 01/30 0701 - 01/31 0700 In: 180 [P.O.:180] Out: 130 [Urine:100; Drains:30] Intake/Output this shift: No intake/output data recorded.  PE: General: pleasant, WD, female who is laying in bed in NAD Lungs: CTAB. Respiratory effort nonlabored on 1 lpm [place] supp O2 with spo2 >90%. Pulling 750 on IS Abd: soft, ND, +BS, mild TTP over incisions and around drain - greatest around drain. Incisions c/d/I. Drain with scant SS fluid MSK: all 4 extremities are symmetrical with no cyanosis, clubbing, or edema. Skin: warm and dry  Psych: A&Ox3 with an appropriate affect.    Lab Results:  Recent Labs    07/17/22 0430 07/17/22 0806 07/18/22 0709  WBC 9.9  --  7.4  HGB 12.0 13.3 11.1*  HCT 37.6 39.0 35.1*  PLT 201  --  196    BMET Recent Labs    07/17/22 0430 07/17/22 0806 07/18/22 0709  NA 138 140 141  K 2.8* 3.1* 4.0  CL 102 104 105  CO2 25  --  24  GLUCOSE 106* 106* 111*  BUN '8 11 10  '$ CREATININE 0.74 0.60 0.66  CALCIUM 8.0*  --  8.1*    PT/INR No results for input(s): "LABPROT", "INR" in the last 72 hours. CMP     Component Value Date/Time   NA 141 07/18/2022 0709   K 4.0 07/18/2022 0709   CL 105 07/18/2022 0709   CO2 24 07/18/2022 0709   GLUCOSE 111 (H) 07/18/2022 0709   BUN 10 07/18/2022 0709   CREATININE 0.66 07/18/2022 0709   CALCIUM 8.1 (L) 07/18/2022 0709   PROT 5.3 (L) 07/18/2022 0709    ALBUMIN 2.5 (L) 07/18/2022 0709   AST 35 07/18/2022 0709   ALT 24 07/18/2022 0709   ALKPHOS 97 07/18/2022 0709   BILITOT 0.4 07/18/2022 0709   GFRNONAA >60 07/18/2022 0709   GFRAA  02/09/2010 1100    >60        The eGFR has been calculated using the MDRD equation. This calculation has not been validated in all clinical situations. eGFR's persistently <60 mL/min signify possible Chronic Kidney Disease.   Lipase     Component Value Date/Time   LIPASE <10 (L) 07/15/2022 1708       Studies/Results: No results found.  Anti-infectives: Anti-infectives (From admission, onward)    Start     Dose/Rate Route Frequency Ordered Stop   07/16/22 1600  cefTRIAXone (ROCEPHIN) 2 g in sodium chloride 0.9 % 100 mL IVPB        2 g 200 mL/hr over 30 Minutes Intravenous Every 24 hours 07/16/22 1433 07/23/22 1559   07/15/22 2000  cefTRIAXone (ROCEPHIN) 2 g in sodium chloride 0.9 % 100 mL IVPB        2 g 200 mL/hr over 30 Minutes Intravenous  Once 07/15/22 1955 07/15/22 2115        Assessment/Plan  Cholecystitis with Cholelithiasis POD2 s/p laparoscopic subtotal cholecystectomy  1/29 Dr. Marlou Starks  - continue IV abx for now and stop on dc - drain 30 ml/24h charted - SS. Continue drain and monitor output - tolerating FLD, advance to soft - encouraged ambulation and IS use. Added flutter valve and duoneb  Possible dc as early as this afternoon pending diet tolerance and weaning O2  FEN: soft, hypokalemia - replete PO, check magnesium ID: rocephin VTE: lovenox  COPD - not on O2 at baseline, wean O2 Anxiety - home med Recent 5th metacarpal fx - s/p surgery. Follow up with ortho as scheduled   LOS: 1 day   Winferd Humphrey, [facility] 07/19/2022, 7:49 AM Please see Amion for pager number during day hours 7:00am-4:30pm

## 2022-07-19 NOTE — Progress Notes (Signed)
Mobility Specialist Progress Note:   07/19/22 1549  Mobility  Activity Ambulated independently in hallway  Level of Assistance Modified independent, requires aide device or extra time  Assistive Device None  Distance Ambulated (ft) 150 ft  Activity Response Tolerated well  Mobility Referral Yes  $Mobility charge 1 Mobility   Pre-mobility: 89% SpO2 on RA During mobility: 87% on SpO2 RA, 91% SpO2 on 1L Post-mobility: 92% SpO2 on 1L  Pt received in bed and agreeable. Per RN request, performed O2 sat test. No c/o SOB or pain throughout ambulation. Pt returned to bed with all needs met, call bell in reach, and family in room.   Andrey Campanile Mobility Specialist Please contact via SecureChat or  Rehab office at [phone]

## 2022-07-19 NOTE — Progress Notes (Signed)
Nurse requested Mobility Specialist to perform oxygen saturation test with pt which includes removing pt from oxygen both at rest and while ambulating.  Below are the results from that testing.    Patient Saturations on Room Air at Rest = spO2 89%  Patient Saturations on Room Air while Ambulating = sp02 87% .  Rested and performed pursed lip breathing for 1 minute with sp02 at 88%.  Patient Saturations on 1 Liter of oxygen while Ambulating = sp02 91%  At end of testing pt left in room on 1 Liter of oxygen.  Reported results to nurse.    Andrey Campanile Mobility Specialist Please contact via SecureChat or  Rehab office at [phone]

## 2022-07-20 NOTE — Progress Notes (Signed)
Physical Therapy Treatment Patient Details Name: MALINDA MAYDEN MRN: [id number] DOB: Jul 19, 1945 Today's Date: 07/20/2022   History of Present Illness 77 y.o. female admitted 07/15/2022 with RUQ pain, nausea and acute cholecystitis. S/p lap cholecystectomy on 1/29. PMH includes anxiety, bladder tumor, COPD, depression, migraines, recent Rt wrist ORIF.    PT Comments    Pt pleasant and reports frustration over frequent urination. Pt requiring supplemental oxygen throughout session to maintain sats >90% and cues for breathing technique and increased inspiration volume. Pt educated for HEP and progressive gait with pt tolerating well. Will continue to follow.   RA at rest with SPO2 89% with drop to 87% with limited mobility 2L to maintain 92% with gait    Recommendations for follow up therapy are one component of a multi-disciplinary discharge planning process, led by the attending physician.  Recommendations may be updated based on patient status, additional functional criteria and insurance authorization.  Follow Up Recommendations  No PT follow up     Assistance Recommended at Discharge PRN  Patient can return home with the following A little help with bathing/dressing/bathroom;Assistance with cooking/housework;Help with stairs or ramp for entrance;Assist for transportation   Equipment Recommendations  None recommended by PT    Recommendations for Other Services       Precautions / Restrictions Precautions Precautions: Fall Precaution Comments: JP drain abdomen, watch sats Restrictions Weight Bearing Restrictions: No RUE Weight Bearing: Weight bearing as tolerated Other Position/Activity Restrictions: assumed WBAT through elbow, NWB through hand/wrist     Mobility  Bed Mobility Overal bed mobility: Modified Independent             General bed mobility comments: rolling right with increased time to rise    Transfers Overall transfer level: Modified independent      Sit to Stand: Modified independent (Device/Increase time)           General transfer comment: pt able to stand from bed and BSC without UE support    Ambulation/Gait Ambulation/Gait assistance: Supervision Gait Distance (Feet): 275 Feet Assistive device: None Gait Pattern/deviations: Step-through pattern, Wide base of support, Decreased stride length   Gait velocity interpretation: 1.31 - 2.62 ft/sec, indicative of limited community ambulator   General Gait Details: pt with decreased stride and increased BOS. Pt with desaturation to 87% on RA and required 2L to maintain 92% with gait   Stairs             Wheelchair Mobility    Modified Rankin (Stroke Patients Only)       Balance Overall balance assessment: Mild deficits observed, not formally tested                                          Cognition Arousal/Alertness: Awake/alert Behavior During Therapy: WFL for tasks assessed/performed Overall Cognitive Status: Within Functional Limits for tasks assessed                                          Exercises General Exercises - Lower Extremity Long Arc Quad: AROM, 20 reps, Both, Seated Hip Flexion/Marching: AROM, 20 reps, Both, Seated Toe Raises: AROM, 20 reps, Both, Seated Heel Raises: AROM, 20 reps, Both, Seated    General Comments        Pertinent Vitals/Pain Pain Assessment Pain Score:  3  Pain Location: abdomen Pain Descriptors / Indicators: Sore Pain Intervention(s): Limited activity within patient's tolerance, Monitored during session, Repositioned    Home Living                          Prior Function            PT Goals (current goals can now be found in the care plan section) Progress towards PT goals: Progressing toward goals    Frequency    Min 3X/week      PT Plan Current plan remains appropriate    Co-evaluation              AM-PAC PT "6 Clicks" Mobility   Outcome  Measure  Help needed turning from your back to your side while in a flat bed without using bedrails?: None Help needed moving from lying on your back to sitting on the side of a flat bed without using bedrails?: None Help needed moving to and from a bed to a chair (including a wheelchair)?: A Little Help needed standing up from a chair using your arms (e.g., wheelchair or bedside chair)?: A Little Help needed to walk in hospital room?: A Little Help needed climbing 3-5 steps with a railing? : A Little 6 Click Score: 20    End of Session Equipment Utilized During Treatment: Oxygen Activity Tolerance: Patient tolerated treatment well Patient left: in chair;with call bell/phone within reach;with family/visitor present Nurse Communication: Mobility status PT Visit Diagnosis: Other abnormalities of gait and mobility (R26.89)     Time: 9476-5465 PT Time Calculation (min) (ACUTE ONLY): 35 min  Charges:  $Gait Training: 8-22 mins $Therapeutic Activity: 8-22 mins                     Bayard Males, PT Acute Rehabilitation Services Office: [place] 07/20/2022, 10:36 AM

## 2022-07-20 NOTE — Progress Notes (Signed)
SATURATION QUALIFICATIONS: (This note is used to comply with regulatory documentation for home oxygen)  Patient Saturations on Room Air at Rest = 89%  Patient Saturations on Room Air while Ambulating = 87%  Patient Saturations on 2 Liters of oxygen while Ambulating = 92%  Please briefly explain why patient needs home oxygen: Pt requiring supplemental oxygen at rest and with ambulation to maintain SpO2 >90%  Javaughn Opdahl P, PT Acute Rehabilitation Services Office: [phone]

## 2022-07-20 NOTE — Progress Notes (Signed)
Progress Note  3 Days Post-Op  Subjective: Didn't have a great night with multiple frequent trips to void.  Purewick placed due to urgency.  Some nausea, but overall tolerating soft diet.  Just not having a great day today.  87 on RA with PT and came up to 92% with 2L  Objective: Vital signs in last 24 hours: Temp:  [98.1 F (36.7 C)-99.3 F (37.4 C)] 98.1 F (36.7 C) (02/01 0737) Pulse Rate:  [73-88] 73 (02/01 0737) Resp:  [15-19] 17 (02/01 0525) BP: (125-154)/(66-82) 146/78 (02/01 0737) SpO2:  [92 %-94 %] 93 % (02/01 0737) Last BM Date : 07/17/22  Intake/Output from previous day: 01/31 0701 - 02/01 0700 In: 682.9 [P.O.:480; IV Piggyback:202.9] Out: 1730 [Urine:1700; Drains:30] Intake/Output this shift: Total I/O In: -  Out: 20 [Drains:20]  PE: General: pleasant, WD, female who is laying in bed in NAD Lungs: CTAB. Respiratory effort nonlabored on 2 lpm [place] sats 87-89% lying in bed. Pulling 500-750 on IS Abd: soft, ND, +BS, mild TTP over incisions and around drain - greatest around drain. Incisions c/d/I. Drain with scant SS fluid Psych: A&Ox3 and tearful today   Lab Results:  Recent Labs    07/18/22 0709 07/19/22 0823  WBC 7.4 6.4  HGB 11.1* 12.2  HCT 35.1* 37.5  PLT 196 293   BMET Recent Labs    07/18/22 0709 07/19/22 0823  NA 141 140  K 4.0 3.1*  CL 105 104  CO2 24 27  GLUCOSE 111* 96  BUN 10 5*  CREATININE 0.66 0.79  CALCIUM 8.1* 8.1*   PT/INR No results for input(s): "LABPROT", "INR" in the last 72 hours. CMP     Component Value Date/Time   NA 140 07/19/2022 0823   K 3.1 (L) 07/19/2022 0823   CL 104 07/19/2022 0823   CO2 27 07/19/2022 0823   GLUCOSE 96 07/19/2022 0823   BUN 5 (L) 07/19/2022 0823   CREATININE 0.79 07/19/2022 0823   CALCIUM 8.1 (L) 07/19/2022 0823   PROT 5.5 (L) 07/19/2022 0823   ALBUMIN 2.7 (L) 07/19/2022 0823   AST 26 07/19/2022 0823   ALT 26 07/19/2022 0823   ALKPHOS 91 07/19/2022 0823   BILITOT 0.3 07/19/2022 0823    GFRNONAA >60 07/19/2022 0823   GFRAA  02/09/2010 1100    >60        The eGFR has been calculated using the MDRD equation. This calculation has not been validated in all clinical situations. eGFR's persistently <60 mL/min signify possible Chronic Kidney Disease.   Lipase     Component Value Date/Time   LIPASE <10 (L) 07/15/2022 1708       Studies/Results: CT Angio Chest Pulmonary Embolism (PE) W or WO Contrast  Result Date: 07/19/2022 CLINICAL DATA:  Pulmonary embolism (PE) suspected, high prob post op shortness of breath and hypoxia EXAM: CT ANGIOGRAPHY CHEST WITH CONTRAST TECHNIQUE: Multidetector CT imaging of the chest was performed using the standard protocol during bolus administration of intravenous contrast. Multiplanar CT image reconstructions and MIPs were obtained to evaluate the vascular anatomy. RADIATION DOSE REDUCTION: This exam was performed according to the departmental dose-optimization program which includes automated exposure control, adjustment of the mA and/or kV according to patient size and/or use of iterative reconstruction technique. CONTRAST:  26m OMNIPAQUE IOHEXOL 350 MG/ML SOLN COMPARISON:  Radiograph earlier today. Chest CT 02/26/2019 FINDINGS: Cardiovascular: There are no filling defects within the pulmonary arteries to suggest pulmonary embolus. Mild aortic atherosclerosis and tortuosity. No aneurysm or  acute aortic findings. The heart is normal in size. No pericardial effusion. Mediastinum/Nodes: No mediastinal or hilar adenopathy. Decompressed esophagus. Lungs/Pleura: Moderate emphysema. Bandlike atelectasis within the lingula, right middle lobe in both lower lobes. There is also dependent atelectasis in the right greater than left lower lobe. No significant effusion. The small pulmonary nodules on prior lung cancer screening CT are obscured by motion on the current exam. No obvious pulmonary mass. No features of pulmonary edema. Upper Abdomen: No acute  findings in the included upper abdomen. 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 There are no acute or suspicious osseous abnormalities. Review of the MIP images confirms the above findings. IMPRESSION: 1. No pulmonary embolus. 2. Bandlike and compressive atelectasis in the lower lung zones. 3. Emphysema. Aortic Atherosclerosis (ICD10-I70.0) and Emphysema (ICD10-J43.9). Electronically Signed   By: Keith Rake M.D.   On: 07/19/2022 21:47   DG CHEST PORT 1 VIEW  Result Date: 07/19/2022 CLINICAL DATA:  Shortness of breath. EXAM: PORTABLE CHEST 1 VIEW COMPARISON:  Chest x-ray 07/15/2022 FINDINGS: The cardiac silhouette, mediastinal and hilar contours are within normal limits and stable. Progressive bibasilar atelectasis in part due to low lung volumes. No definite pleural effusions. IMPRESSION: Progressive bibasilar atelectasis in part due to low lung volumes. Electronically Signed   By: Marijo Sanes M.D.   On: 07/19/2022 14:51    Anti-infectives: Anti-infectives (From admission, onward)    Start     Dose/Rate Route Frequency Ordered Stop   07/16/22 1600  cefTRIAXone (ROCEPHIN) 2 g in sodium chloride 0.9 % 100 mL IVPB        2 g 200 mL/hr over 30 Minutes Intravenous Every 24 hours 07/16/22 1433 07/23/22 1559   07/15/22 2000  cefTRIAXone (ROCEPHIN) 2 g in sodium chloride 0.9 % 100 mL IVPB        2 g 200 mL/hr over 30 Minutes Intravenous  Once 07/15/22 1955 07/15/22 2115        Assessment/Plan POD 3, s/p laparoscopic subtotal cholecystectomy 1/29 Dr. Marlou Starks for cholecystitis - continue IV abx for now and stop on dc - drain 50 ml/24h charted - SS. Continue drain and monitor output - soft diet - encouraged ambulation and IS use. Added flutter valve and duoneb -not yet ready for DC from respiratory standpoint, hopefully tomorrow.  FEN: soft  ID: rocephin VTE: lovenox  COPD - not on O2 at baseline, CTA negative for PE.  Unable to wean O2 at  this time.  HH order placed for temporary need for O2 at home. Anxiety - home med Recent 5th metacarpal fx - s/p surgery. Follow up with ortho as scheduled   LOS: 2 days   [name], [facility] Surgery 07/20/2022, 10:27 AM Please see Amion for pager number during day hours 7:00am-4:30pm

## 2022-07-21 LAB — BASIC METABOLIC PANEL
Anion gap: 8 (ref 5–15)
BUN: 9 mg/dL (ref 8–23)
CO2: 27 mmol/L (ref 22–32)
Calcium: 8.5 mg/dL — ABNORMAL LOW (ref 8.9–10.3)
Chloride: 103 mmol/L (ref 98–111)
Creatinine, Ser: 0.75 mg/dL (ref 0.44–1.00)
GFR, Estimated: >60 mL/min (ref >60)
Glucose, Bld: 102 mg/dL — ABNORMAL HIGH (ref 70–99)
Potassium: 3.7 mmol/L (ref 3.5–5.1)
Sodium: 138 mmol/L (ref 135–145)

## 2022-07-21 LAB — GLUCOSE, CAPILLARY: Glucose-Capillary: 106 mg/dL — ABNORMAL HIGH (ref 70–99)

## 2022-07-21 LAB — HEPATIC FUNCTION PANEL
ALT: 30 U/L (ref 0–44)
AST: 50 U/L — ABNORMAL HIGH (ref 15–41)
Albumin: 2.7 g/dL — ABNORMAL LOW (ref 3.5–5.0)
Alkaline Phosphatase: 91 U/L (ref 38–126)
Bilirubin, Direct: 0.1 mg/dL (ref 0.0–0.2)
Indirect Bilirubin: 0.2 mg/dL — ABNORMAL LOW (ref 0.3–0.9)
Total Bilirubin: 0.3 mg/dL (ref 0.3–1.2)
Total Protein: 5.3 g/dL — ABNORMAL LOW (ref 6.5–8.1)

## 2022-07-21 MED ORDER — GUAIFENESIN ER 600 MG PO TB12
600.0000 mg | ORAL_TABLET | Freq: Two times a day (BID) | ORAL | Status: DC
  Administered 2022-07-21 – 2022-07-27 (×11): 600 mg via ORAL
  Filled 2022-07-21 (×12): qty 1

## 2022-07-21 MED ORDER — SODIUM CHLORIDE 0.9 % IV SOLN: INTRAVENOUS | Status: DC

## 2022-07-21 MED ORDER — GUAIFENESIN 100 MG/5ML PO LIQD
5.0000 mL | ORAL | Status: DC | PRN
  Administered 2022-07-21 – 2022-07-24 (×3): 5 mL via ORAL
  Filled 2022-07-21 (×5): qty 15

## 2022-07-21 NOTE — Progress Notes (Signed)
Progress Note  4 Days Post-Op  Subjective: Better day yesterday. Feels she is breathing better. Has noted some leaking around her drain. Tolerating diet. Passing flatus. No BM  Daughter at bedside  Objective: Vital signs in last 24 hours: Temp:  [97.7 F (36.5 C)-98.3 F (36.8 C)] 98.3 F (36.8 C) (02/02 0718) Pulse Rate:  [71-78] 76 (02/02 0718) Resp:  [17] 17 (02/02 0718) BP: (109-134)/(62-81) 134/81 (02/02 0718) SpO2:  [92 %-95 %] 93 % (02/02 0718) Last BM Date : 07/17/22  Intake/Output from previous day: 02/01 0701 - 02/02 0700 In: 120 [P.O.:120] Out: 465 [Urine:400; Drains:65] Intake/Output this shift: No intake/output data recorded.  PE: General: pleasant, WD, female who is laying in bed in NAD Lungs: CTAB. Respiratory effort nonlabored on room air. Oxygen saturation 89-92% at rest Abd: soft, ND, +BS, mild TTP over incisions and around drain - greatest around drain. Incisions c/d/I. Drain with bilious fluid in bulb and leaked onto abdominal binder - 70m/24hrs documented Psych: A&Ox3, in good spirits   Lab Results:  Recent Labs    07/19/22 0823  WBC 6.4  HGB 12.2  HCT 37.5  PLT 293    BMET Recent Labs    07/19/22 0823 07/21/22 0543  NA 140 138  K 3.1* 3.7  CL 104 103  CO2 27 27  GLUCOSE 96 102*  BUN 5* 9  CREATININE 0.79 0.75  CALCIUM 8.1* 8.5*    PT/INR No results for input(s): "LABPROT", "INR" in the last 72 hours. CMP     Component Value Date/Time   NA 138 07/21/2022 0543   K 3.7 07/21/2022 0543   CL 103 07/21/2022 0543   CO2 27 07/21/2022 0543   GLUCOSE 102 (H) 07/21/2022 0543   BUN 9 07/21/2022 0543   CREATININE 0.75 07/21/2022 0543   CALCIUM 8.5 (L) 07/21/2022 0543   PROT 5.5 (L) 07/19/2022 0823   ALBUMIN 2.7 (L) 07/19/2022 0823   AST 26 07/19/2022 0823   ALT 26 07/19/2022 0823   ALKPHOS 91 07/19/2022 0823   BILITOT 0.3 07/19/2022 0823   GFRNONAA >60 07/21/2022 0543   GFRAA  02/09/2010 1100    >60        The eGFR has  been calculated using the MDRD equation. This calculation has not been validated in all clinical situations. eGFR's persistently <60 mL/min signify possible Chronic Kidney Disease.   Lipase     Component Value Date/Time   LIPASE <10 (L) 07/15/2022 1708       Studies/Results: CT Angio Chest Pulmonary Embolism (PE) W or WO Contrast  Result Date: 07/19/2022 CLINICAL DATA:  Pulmonary embolism (PE) suspected, high prob post op shortness of breath and hypoxia EXAM: CT ANGIOGRAPHY CHEST WITH CONTRAST TECHNIQUE: Multidetector CT imaging of the chest was performed using the standard protocol during bolus administration of intravenous contrast. Multiplanar CT image reconstructions and MIPs were obtained to evaluate the vascular anatomy. RADIATION DOSE REDUCTION: This exam was performed according to the departmental dose-optimization program which includes automated exposure control, adjustment of the mA and/or kV according to patient size and/or use of iterative reconstruction technique. CONTRAST:  736mOMNIPAQUE IOHEXOL 350 MG/ML SOLN COMPARISON:  Radiograph earlier today. Chest CT 02/26/2019 FINDINGS: Cardiovascular: There are no filling defects within the pulmonary arteries to suggest pulmonary embolus. Mild aortic atherosclerosis and tortuosity. No aneurysm or acute aortic findings. The heart is normal in size. No pericardial effusion. Mediastinum/Nodes: No mediastinal or hilar adenopathy. Decompressed esophagus. Lungs/Pleura: Moderate emphysema. Bandlike atelectasis within the lingula,  right middle lobe in both lower lobes. There is also dependent atelectasis in the right greater than left lower lobe. No significant effusion. The small pulmonary nodules on prior lung cancer screening CT are obscured by motion on the current exam. No obvious pulmonary mass. No features of pulmonary edema. Upper Abdomen: No acute findings in the included upper abdomen. Musculoskeletal: Thoracic degenerative change with  multilevel degenerative disc disease. There are no acute or suspicious osseous abnormalities. Review of the MIP images confirms the above findings. IMPRESSION: 1. No pulmonary embolus. 2. Bandlike and compressive atelectasis in the lower lung zones. 3. Emphysema. Aortic Atherosclerosis (ICD10-I70.0) and Emphysema (ICD10-J43.9). Electronically Signed   By: Keith Rake M.D.   On: 07/19/2022 21:47   DG CHEST PORT 1 VIEW  Result Date: 07/19/2022 CLINICAL DATA:  Shortness of breath. EXAM: PORTABLE CHEST 1 VIEW COMPARISON:  Chest x-ray 07/15/2022 FINDINGS: The cardiac silhouette, mediastinal and hilar contours are within normal limits and stable. Progressive bibasilar atelectasis in part due to low lung volumes. No definite pleural effusions. IMPRESSION: Progressive bibasilar atelectasis in part due to low lung volumes. Electronically Signed   By: Marijo Sanes M.D.   On: 07/19/2022 14:51    Anti-infectives: Anti-infectives (From admission, onward)    Start     Dose/Rate Route Frequency Ordered Stop   07/16/22 1600  cefTRIAXone (ROCEPHIN) 2 g in sodium chloride 0.9 % 100 mL IVPB        2 g 200 mL/hr over 30 Minutes Intravenous Every 24 hours 07/16/22 1433 07/23/22 1559   07/15/22 2000  cefTRIAXone (ROCEPHIN) 2 g in sodium chloride 0.9 % 100 mL IVPB        2 g 200 mL/hr over 30 Minutes Intravenous  Once 07/15/22 1955 07/15/22 2115        Assessment/Plan POD 4, s/p laparoscopic subtotal cholecystectomy 1/29 Dr. Marlou Starks for cholecystitis - continue IV abx for now and stop on dc - drain 22 ml/24h charted - now appears bilious - will contact GI for evaluation, has seen Dr. Benson Norway in the past. Continue drain and monitor output - soft diet - encouraged ambulation and IS use. Added flutter valve and duoneb  FEN: soft  ID: rocephin VTE: lovenox  COPD - not on O2 at baseline, CTA negative for PE.  Unable to wean O2 at this time.  HH order placed for temporary need for O2 at home. Anxiety - home  med Recent 5th metacarpal fx - s/p surgery. Follow up with ortho as scheduled   LOS: 3 days   Winferd Humphrey, [facility] 07/21/2022, 8:42 AM Please see Amion for pager number during day hours 7:00am-4:30pm

## 2022-07-21 NOTE — Progress Notes (Signed)
Physical Therapy Treatment Patient Details Name: [name] MRN: [id number] DOB: [birth year] Today's Date: 07/21/2022   History of Present Illness 77 y.o. female admitted 07/15/2022 with RUQ pain, nausea and acute cholecystitis. S/p lap cholecystectomy on 1/29. PMH includes anxiety, bladder tumor, COPD, depression, migraines, recent Rt wrist ORIF.    PT Comments    Pt very pleasant, reports decreased pain, increased mobility and breathing better. Pt on RA throughout session with a few brief drops to 89% but able to recover with standing rest and pursed lip breathing. Pt encouraged to have pulse ox for home use and with frequent rests with activity maintaining 90-92% on RA including stairs. Pt without assist or deficits in balance with transfers and mobility today with all necessary assist available at home. Will sign off with pt aware and agreeable. Pt educated for IS use and able to pull 1250cc.     Recommendations for follow up therapy are one component of a multi-disciplinary discharge planning process, led by the attending physician.  Recommendations may be updated based on patient status, additional functional criteria and insurance authorization.  Follow Up Recommendations  No PT follow up     Assistance Recommended at Discharge PRN  Patient can return home with the following Assistance with cooking/housework   Equipment Recommendations  None recommended by PT    Recommendations for Other Services       Precautions / Restrictions Precautions Precautions: Fall Precaution Comments: JP drain abdomen, watch sats     Mobility  Bed Mobility               General bed mobility comments: EOB on arrival and end of session    Transfers Overall transfer level: Modified independent                      Ambulation/Gait Ambulation/Gait assistance: Modified independent (Device/Increase time) Gait Distance (Feet): 450 Feet Assistive device: None Gait  Pattern/deviations: WFL(Within Functional Limits)   Gait velocity interpretation: >2.62 ft/sec, indicative of community ambulatory   General Gait Details: standing rest every 150' grossly to maintain SPO2 89% or greater. Pt with 4 standing rests during gait with cues for pursed lip breathing   Stairs Stairs: Yes Stairs assistance: Modified independent (Device/Increase time) Stair Management: One rail Right Number of Stairs: 3 General stair comments: pt leaning right elbow against rail to ascend without need for assist   Wheelchair Mobility    Modified Rankin (Stroke Patients Only)       Balance Overall balance assessment: Mild deficits observed, not formally tested                                          Cognition Arousal/Alertness: Awake/alert Behavior During Therapy: WFL for tasks assessed/performed Overall Cognitive Status: Within Functional Limits for tasks assessed                                          Exercises      General Comments        Pertinent Vitals/Pain Pain Assessment Pain Score: 2  Pain Location: abdomen Pain Descriptors / Indicators: Sore Pain Intervention(s): Limited activity within patient's tolerance, Repositioned, Monitored during session    Home Living  Prior Function            PT Goals (current goals can now be found in the care plan section) Progress towards PT goals: Goals met/education completed, patient discharged from PT    Frequency           PT Plan Current plan remains appropriate    Co-evaluation              AM-PAC PT "6 Clicks" Mobility   Outcome Measure  Help needed turning from your back to your side while in a flat bed without using bedrails?: None Help needed moving from lying on your back to sitting on the side of a flat bed without using bedrails?: None Help needed moving to and from a bed to a chair (including a  wheelchair)?: None Help needed standing up from a chair using your arms (e.g., wheelchair or bedside chair)?: None Help needed to walk in hospital room?: None Help needed climbing 3-5 steps with a railing? : None 6 Click Score: 24    End of Session   Activity Tolerance: Patient tolerated treatment well Patient left: in bed;with call bell/phone within reach;with family/visitor present Nurse Communication: Mobility status PT Visit Diagnosis: Other abnormalities of gait and mobility (R26.89)     Time: 5379-4327 PT Time Calculation (min) (ACUTE ONLY): 19 min  Charges:  $Gait Training: 8-22 mins                     Bayard Males, PT Acute Rehabilitation Services Office: [place] 07/21/2022, 11:53 AM

## 2022-07-21 NOTE — Progress Notes (Signed)
Mobility Specialist Progress Note:   07/21/22 1441  Mobility  Activity Ambulated independently in hallway  Level of Assistance Independent  Assistive Device None  Distance Ambulated (ft) 350 ft  Activity Response Tolerated well  Mobility Referral Yes  $Mobility charge 1 Mobility   Pt received sitting EOB and agreeable. C/o mild SOB, requiring a total of 5 brief standing rest breaks. Pt returned sitting EOB with all needs met, call bell in reach, and family in room.   Andrey Campanile Mobility Specialist Please contact via SecureChat or  Rehab office at [phone]

## 2022-07-21 NOTE — Progress Notes (Signed)
SATURATION QUALIFICATIONS:  Patient Saturations on Room Air at Rest = 93%  Patient Saturations on Room Air while Ambulating = 90%  Pt able to maintain SPO2 on RA this session with energy conservation and standing rest every 150' to maintain SPO2 >89% Shanoah Asbill P, PT Acute Rehabilitation Services Office: [phone]

## 2022-07-21 NOTE — TOC Initial Note (Addendum)
Transition of Care [facility]) - Initial/Assessment Note    Patient Details  Name: [name] MRN: [id number] Date of Birth: 1946-02-26  Transition of Care [facility]) CM/SW Contact:    Curlene Labrum, RN Phone Number: 07/21/2022, 11:17 AM  Clinical Narrative:                 CM met with the patient at the bedside to discuss transitions of care to home once she is medically clear by [facility] surgery group.  The patient lives with her spouse at the home and has no DME at this time including home oxygen nor nebulizer machine.  Ambulatory eval for oxygen needs was provided and oxygen orders were placed by the medical team.  I provided the patient with Medicare choice regarding DME company and she did not have a preferece.  I called Jermaine, CM with [employer] and he accepted the referral for delivery of  home oxygen to the hospital room today.  The patient requested referral to pulmonology physician and Jana Half, [place] with [facility] was notified to assist with referral.  The patient is S/P Lap cholecystectomy with JP drain and may have pending tests in radiology today with likely discharge home today or tomorrow.  The patient's family will provide transportation to home by car.  No other needs by [facility] Team at this time.  07/21/21 1325 - Oxygen DME orders were discontinued by [facility] - [employer] called to pick up home oxygen that was delivered this morning- patient is aware.  Expected Discharge Plan: Home/Self Care Barriers to Discharge: Continued Medical Work up   Patient Goals and CMS Choice Patient states their goals for this hospitalization and ongoing recovery are:: To get better and go home CMS Medicare.gov Compare Post Acute Care list provided to:: Patient Choice offered to / list presented to : Patient [place] ownership interest in [facility].provided to:: Patient    Expected Discharge Plan and Services   Discharge Planning Services: CM Consult Post Acute Care Choice: Durable Medical  Equipment Living arrangements for the past 2 months: Single Family Home                 DME Arranged: Oxygen DME Agency: [employer] Date DME Agency Contacted: 07/21/22 Time DME Agency Contacted: 1829 Representative spoke with at DME Agency: Brenton Grills, CM with [employer]     Date [place]: 07/21/22 Time [place]: 1114 Representative spoke with at [place]: Brenton Grills, [place] with [employer] DME  Prior Living Arrangements/Services Living arrangements for the past 2 months: Single Family Home Lives with:: Spouse Patient language and need for interpreter reviewed:: Yes Do you feel safe going back to the place where you live?: Yes      Need for Family Participation in Patient Care: Yes (Comment) Care giver support system in place?: Yes (comment) Current home services: DME Criminal Activity/Legal Involvement Pertinent to Current Situation/Hospitalization: No - Comment as needed  Activities of Daily Living Home Assistive Devices/Equipment: Eyeglasses ADL Screening (condition at time of admission) Patient's cognitive ability adequate to safely complete daily activities?: Yes Is the patient deaf or have difficulty hearing?: No Does the patient have difficulty seeing, even when wearing glasses/contacts?: No Does the patient have difficulty concentrating, remembering, or making decisions?: No Patient able to express need for assistance with ADLs?: Yes Does the patient have difficulty dressing or bathing?: No Independently performs ADLs?: Yes (appropriate for developmental age) Does the patient have difficulty walking or climbing stairs?: No Weakness of Legs: None  Weakness of Arms/Hands: None  Permission Sought/Granted Permission sought to share information with : Case Manager, Family Supports, Investment banker, corporate granted to share info w AGENCY: DME company for home oxygen  Permission granted to share info w Relationship: spouse [name] - [phone]     Emotional Assessment Appearance:: Appears stated age Attitude/Demeanor/Rapport: Gracious Affect (typically observed): Accepting Orientation: : Oriented to Self, Oriented to Place, Oriented to  Time, Oriented to Situation Alcohol / Substance Use: Not Applicable (Quit smoking 13 years ago) Psych Involvement: No (comment)  Admission diagnosis:  Acute cholecystitis [K81.0] Hypokalemia [E87.6] Cholecystitis [K81.9] Cholecystitis, acute with cholelithiasis [K80.00] Patient Active Problem List   Diagnosis Date Noted   Cholecystitis, acute with cholelithiasis 07/18/2022   Acute cholecystitis 07/15/2022   Allergic rhinitis 05/02/2021   Bladder cancer ([place]) 08/30/2020   Pain of left hand 08/30/2020   Atrophic vaginitis 11/25/2018   Transient insomnia 05/03/2015   Distal radius fracture, right 11/03/2014   Acute bronchitis 06/14/2011   NEPHROLITHIASIS 05/04/2010   HEMATURIA UNSPECIFIED 11/03/2009   HYPERLIPIDEMIA 09/23/2009   PERS HX TOBACCO USE PRESENTING HAZARDS HEALTH 09/23/2009   Osteoporosis 10/05/2008   Disorder of skeletal system 10/05/2008   Depression, recurrent ([place]) 09/17/2008   PCP:  Eulas Post, MD Pharmacy:   [id number] [place], [place] - [address] [place] [id number]: [phone]: [phone]    Social Determinants of Health (SDOH) Social History: SDOH Screenings   Food Insecurity: No Food Insecurity (06/16/2022)  Housing: Low Risk  (06/16/2022)  Transportation Needs: No Transportation Needs (06/16/2022)  Alcohol Screen: Low Risk  (06/16/2022)  Depression (PHQ2-9): Low Risk  (06/16/2022)  Financial Resource Strain: Low Risk  (07/18/2021)  Physical Activity: Unknown (06/16/2022)  Recent Concern: Physical Activity - Inactive (06/16/2022)  Social Connections: Moderately Integrated (06/16/2022)  Stress: No Stress Concern Present  (06/16/2022)  Tobacco Use: Medium Risk (07/18/2022)   SDOH Interventions:     Readmission Risk Interventions    07/21/2022   11:16 AM  Readmission Risk Prevention Plan  Post Dischage Appt Complete  Medication Screening Complete  Transportation Screening Complete

## 2022-07-21 NOTE — Progress Notes (Signed)
    Durable Medical Equipment  (From admission, onward)           Start     Ordered   07/21/22 1112  For home use only DME oxygen  Once       Question Answer Comment  Length of Need 6 Months   Mode or (Route) Nasal cannula   Frequency Continuous (stationary and portable oxygen unit needed)   Oxygen delivery system Gas      07/21/22 1111

## 2022-07-21 NOTE — Care Management Important Message (Signed)
Important Message  Patient Details  Name: [name] MRN: [id number] Date of Birth: Jan 10, 1946   Medicare Important Message Given:  Yes     Blayn Whetsell [address] Circle 07/21/2022, 1:39 PM

## 2022-07-21 NOTE — Consult Note (Signed)
Reason for Consult: Bile Leak Referring Physician: [facility]  Floyce Stakes HPI: This is a 77 year old female with a PMH of COPD and colonic polyps admitted for an acute cholecystitis.  Her symptoms started on the weekend before her admission.  She reported having RUQ pain.  Upon presentation to the [facility] ER she was identified to have an acute cholecystitis without biliary ductal dilation.  Her liver enzymes were normal.  Dr. Marlou Starks operated and performed a subtotal cholecystectomy as there was adherence of the gallbladder to the duodenum and stomach.  Over the course of her hospitalization she had the expected fluid in the drainage, but over the past 48 hours the drainage increased.  There was also leaking around the tube site.  Because of this development, a GI consultation was obtained to perform an ERCP with stent placement.  Past Medical History:  Diagnosis Date   Anxiety    aniety attacks   Bladder tumor    Colon polyps    COPD (chronic obstructive pulmonary disease) ([place])    09-08-2020 acute flare up with cough finished antibiotics and steroids cough resolved now   Depression    Hay fever    allergies   History of kidney stones    Migraines    not in 40 years   Wears glasses     Past Surgical History:  Procedure Laterality Date   BREAST BIOPSY  1990   caesarean secion  1974, 1977, 1984   [place]  [phone]   CHOLECYSTECTOMY N/A 07/17/2022   Procedure: LAPAROSCOPIC SUBTOTAL CHOLECYSTECTOMY;  Surgeon: Jovita Kussmaul, MD;  Location: [place];  Service: General;  Laterality: N/A;   COLONOSCOPY W/ POLYPECTOMY  last done 2016    x 3    CYSTOSCOPY N/A 09/21/2020   Procedure: CYSTOSCOPY;  Surgeon: Robley Fries, MD;  [facility]  Service: Urology;  Laterality: N/A;   CYSTOSCOPY W/ RETROGRADES Bilateral 02/04/2020   Procedure: CYSTOSCOPY WITH RETROGRADE PYELOGRAM;  Surgeon: Lucas Mallow, MD;  Location: [facility];   Service: Urology;  Laterality: Bilateral;   FINGER FRACTURE SURGERY Right 07/11/2022   5th digit has a screw   OPEN REDUCTION INTERNAL FIXATION (ORIF) DISTAL RADIAL FRACTURE Right 11/03/2014   Procedure: OPEN REDUCTION INTERNAL FIXATION (ORIF) RIGHT DISTAL RADIAL FRACTURE WITH REPAIR AND RECONSTRUCTION;  Surgeon: Roseanne Kaufman, MD;  Location: [place];  Service: Orthopedics;  Laterality: Right;   TRANSURETHRAL RESECTION OF BLADDER TUMOR WITH MITOMYCIN-C N/A 02/04/2020   Procedure: TRANSURETHRAL RESECTION OF BLADDER TUMOR WITH POST OPERATIVE GEMCITABINE;  Surgeon: Lucas Mallow, MD;  Location[facility];  Service: Urology;  Laterality: N/A;   TRANSURETHRAL RESECTION OF BLADDER TUMOR WITH MITOMYCIN-C N/A 09/21/2020   Procedure: CYSTOSCOPY WITH BLADDER FULGARATION WITH POST OP GEMCITABINE;  Surgeon: Robley Fries, MD;  Location: [place];  Service: Urology;  Laterality: N/A;  1 HR    Family History  Problem Relation Age of Onset   Arthritis Other    Stroke Other    Hypertension Other    Depression Other    Migraines Other    Colon polyps Other    Breast cancer Neg Hx     Social History:  reports that she quit smoking about 12 years ago. Her smoking use included cigarettes. She has a 75.00 pack-year smoking history. She has never used smokeless tobacco. She reports current alcohol use of about 5.0 standard drinks of alcohol per week. She  reports that she does not use drugs.  Allergies:  Allergies  Allergen Reactions   Aspirin Other (See Comments)     vasculitis    Medications: Scheduled:  acetaminophen  1,000 mg Oral Q6H   enoxaparin (LOVENOX) injection  40 mg Subcutaneous Q24H   FLUoxetine  20 mg Oral Daily   potassium chloride  20 mEq Oral TID   Continuous:  cefTRIAXone (ROCEPHIN)  IV 2 g (07/20/22 1734)    Results for orders placed or performed during the hospital encounter of 07/15/22 (from the past 24 hour(s))  Hepatic function  panel     Status: Abnormal   Collection Time: 07/21/22  5:38 AM  Result Value Ref Range   Total Protein 5.3 (L) 6.5 - 8.1 g/dL   Albumin 2.7 (L) 3.5 - 5.0 g/dL   AST 50 (H) 15 - 41 U/L   ALT 30 0 - 44 U/L   Alkaline Phosphatase 91 38 - 126 U/L   Total Bilirubin 0.3 0.3 - 1.2 mg/dL   Bilirubin, Direct 0.1 0.0 - 0.2 mg/dL   Indirect Bilirubin 0.2 (L) 0.3 - 0.9 mg/dL  Basic metabolic panel     Status: Abnormal   Collection Time: 07/21/22  5:43 AM  Result Value Ref Range   Sodium 138 135 - 145 mmol/L   Potassium 3.7 3.5 - 5.1 mmol/L   Chloride 103 98 - 111 mmol/L   CO2 27 22 - 32 mmol/L   Glucose, Bld 102 (H) 70 - 99 mg/dL   BUN 9 8 - 23 mg/dL   Creatinine, Ser 0.75 0.44 - 1.00 mg/dL   Calcium 8.5 (L) 8.9 - 10.3 mg/dL   GFR, Estimated >60 >60 mL/min   Anion gap 8 5 - 15     CT Angio Chest Pulmonary Embolism (PE) W or WO Contrast  Result Date: 07/19/2022 CLINICAL DATA:  Pulmonary embolism (PE) suspected, high prob post op shortness of breath and hypoxia EXAM: CT ANGIOGRAPHY CHEST WITH CONTRAST TECHNIQUE: Multidetector CT imaging of the chest was performed using the standard protocol during bolus administration of intravenous contrast. Multiplanar CT image reconstructions and MIPs were obtained to evaluate the vascular anatomy. RADIATION DOSE REDUCTION: This exam was performed according to the departmental dose-optimization program which includes automated exposure control, adjustment of the mA and/or kV according to patient size and/or use of iterative reconstruction technique. CONTRAST:  54m OMNIPAQUE IOHEXOL 350 MG/ML SOLN COMPARISON:  Radiograph earlier today. Chest CT 02/26/2019 FINDINGS: Cardiovascular: There are no filling defects within the pulmonary arteries to suggest pulmonary embolus. Mild aortic atherosclerosis and tortuosity. No aneurysm or acute aortic findings. The heart is normal in size. No pericardial effusion. Mediastinum/Nodes: No mediastinal or hilar adenopathy.  Decompressed esophagus. Lungs/Pleura: Moderate emphysema. Bandlike atelectasis within the lingula, right middle lobe in both lower lobes. There is also dependent atelectasis in the right greater than left lower lobe. No significant effusion. The small pulmonary nodules on prior lung cancer screening CT are obscured by motion on the current exam. No obvious pulmonary mass. No features of pulmonary edema. Upper Abdomen: No acute findings in the included upper abdomen. 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 There are no acute or suspicious osseous abnormalities. Review of the MIP images confirms the above findings. IMPRESSION: 1. No pulmonary embolus. 2. Bandlike and compressive atelectasis in the lower lung zones. 3. Emphysema. Aortic Atherosclerosis (ICD10-I70.0) and Emphysema (ICD10-J43.9). Electronically Signed   By: MKeith RakeM.D.   On: 07/19/2022 21:47   DG CHEST  PORT 1 VIEW  Result Date: 07/19/2022 CLINICAL DATA:  Shortness of breath. EXAM: PORTABLE CHEST 1 VIEW COMPARISON:  Chest x-ray 07/15/2022 FINDINGS: The cardiac silhouette, mediastinal and hilar contours are within normal limits and stable. Progressive bibasilar atelectasis in part due to low lung volumes. No definite pleural effusions. IMPRESSION: Progressive bibasilar atelectasis in part due to low lung volumes. Electronically Signed   By: Marijo Sanes M.D.   On: 07/19/2022 14:51    ROS:  As stated above in the HPI otherwise negative.  Blood pressure 134/81, pulse 76, temperature 98.3 F (36.8 C), temperature source Oral, resp. rate 17, height '5\' 7"'$  (1.702 m), weight 74 kg, SpO2 93 %.    PE: Gen: NAD, Alert and Oriented HEENT:  [place]/AT, EOMI Neck: Supple, no LAD Lungs: CTA Bilaterally CV: RRR without M/G/R ABD: Soft, tender at the incision sites, +BS, clear bile in the drain Ext: No C/C/E  Assessment/Plan: 1) Bile leak. 2) Subtotal cholecystectomy. 3) COPD.   The patient  requires an ERCP with stent placement.  She was noted to have an increase in the biliary drainage and she also had leakage at the tube site.  The patient is clinically stable.  Unfortunately she ate lunch today and an ERCP was not able to be performed.  Plan: 1) ERCP with stent placement with Dr. Watt Climes tomorrow.  Abrey Bradway D 07/21/2022, 2:22 PM

## 2022-07-22 ENCOUNTER — Encounter (HOSPITAL_COMMUNITY): Payer: Self-pay

## 2022-07-22 ENCOUNTER — Inpatient Hospital Stay (HOSPITAL_COMMUNITY): Payer: Medicare HMO

## 2022-07-22 ENCOUNTER — Inpatient Hospital Stay (HOSPITAL_COMMUNITY): Payer: Medicare HMO | Admitting: Anesthesiology

## 2022-07-22 ENCOUNTER — Encounter (HOSPITAL_COMMUNITY): Admission: EM | Disposition: A | Discharge status: Home Health | Payer: Self-pay | Source: Home / Self Care

## 2022-07-22 DIAGNOSIS — Z87891 Personal history of nicotine dependence: Secondary | ICD-10-CM | POA: Diagnosis not present

## 2022-07-22 DIAGNOSIS — K838 Other specified diseases of biliary tract: Secondary | ICD-10-CM

## 2022-07-22 DIAGNOSIS — J449 Chronic obstructive pulmonary disease, unspecified: Secondary | ICD-10-CM

## 2022-07-22 DIAGNOSIS — F418 Other specified anxiety disorders: Secondary | ICD-10-CM | POA: Diagnosis not present

## 2022-07-22 HISTORY — PX: ERCP: SHX5425

## 2022-07-22 HISTORY — PX: SPHINCTEROTOMY: SHX5544

## 2022-07-22 HISTORY — PX: PANCREATIC STENT PLACEMENT: SHX5539

## 2022-07-22 LAB — BASIC METABOLIC PANEL
Anion gap: 8 (ref 5–15)
BUN: 5 mg/dL — ABNORMAL LOW (ref 8–23)
CO2: 27 mmol/L (ref 22–32)
Calcium: 8.6 mg/dL — ABNORMAL LOW (ref 8.9–10.3)
Chloride: 105 mmol/L (ref 98–111)
Creatinine, Ser: 0.68 mg/dL (ref 0.44–1.00)
GFR, Estimated: >60 mL/min (ref >60)
Glucose, Bld: 98 mg/dL (ref 70–99)
Potassium: 3.4 mmol/L — ABNORMAL LOW (ref 3.5–5.1)
Sodium: 140 mmol/L (ref 135–145)

## 2022-07-22 SURGERY — ERCP, WITH INTERVENTION IF INDICATED
Anesthesia: General

## 2022-07-22 MED ORDER — GLUCAGON HCL RDNA (DIAGNOSTIC) 1 MG IJ SOLR
INTRAMUSCULAR | Status: AC
  Filled 2022-07-22: qty 1

## 2022-07-22 MED ORDER — INDOMETHACIN 50 MG RE SUPP
RECTAL | Status: AC
  Filled 2022-07-22: qty 2

## 2022-07-22 MED ORDER — AMISULPRIDE (ANTIEMETIC) 5 MG/2ML IV SOLN: 10.0000 mg | Freq: Once | INTRAVENOUS | Status: DC | PRN

## 2022-07-22 MED ORDER — LACTATED RINGERS IV SOLN: INTRAVENOUS | Status: DC | PRN

## 2022-07-22 MED ORDER — PHENYLEPHRINE 80 MCG/ML (10ML) SYRINGE FOR IV PUSH (FOR BLOOD PRESSURE SUPPORT)
PREFILLED_SYRINGE | INTRAVENOUS | Status: DC | PRN
  Administered 2022-07-22: 160 ug via INTRAVENOUS

## 2022-07-22 MED ORDER — FENTANYL CITRATE (PF) 100 MCG/2ML IJ SOLN
INTRAMUSCULAR | Status: AC
  Filled 2022-07-22: qty 2

## 2022-07-22 MED ORDER — DICLOFENAC SUPPOSITORY 100 MG
RECTAL | Status: AC
  Filled 2022-07-22: qty 1

## 2022-07-22 MED ORDER — PHENYLEPHRINE HCL-NACL 20-0.9 MG/250ML-% IV SOLN
INTRAVENOUS | Status: DC | PRN
  Administered 2022-07-22: 30 ug/min via INTRAVENOUS

## 2022-07-22 MED ORDER — MEPERIDINE HCL 25 MG/ML IJ SOLN: 6.2500 mg | INTRAMUSCULAR | Status: DC | PRN

## 2022-07-22 MED ORDER — PROPOFOL 10 MG/ML IV BOLUS
INTRAVENOUS | Status: DC | PRN
  Administered 2022-07-22: 90 mg via INTRAVENOUS

## 2022-07-22 MED ORDER — ROCURONIUM BROMIDE 10 MG/ML (PF) SYRINGE
PREFILLED_SYRINGE | INTRAVENOUS | Status: DC | PRN
  Administered 2022-07-22: 20 mg via INTRAVENOUS
  Administered 2022-07-22: 40 mg via INTRAVENOUS
  Administered 2022-07-22: 20 mg via INTRAVENOUS

## 2022-07-22 MED ORDER — DICLOFENAC SUPPOSITORY 100 MG
RECTAL | Status: DC | PRN
  Administered 2022-07-22: 100 mg via RECTAL

## 2022-07-22 MED ORDER — LIDOCAINE 2% (20 MG/ML) 5 ML SYRINGE
INTRAMUSCULAR | Status: DC | PRN
  Administered 2022-07-22: 70 mg via INTRAVENOUS

## 2022-07-22 MED ORDER — FENTANYL CITRATE (PF) 250 MCG/5ML IJ SOLN
INTRAMUSCULAR | Status: DC | PRN
  Administered 2022-07-22 (×2): 50 ug via INTRAVENOUS

## 2022-07-22 MED ORDER — ONDANSETRON HCL 4 MG/2ML IJ SOLN
INTRAMUSCULAR | Status: DC | PRN
  Administered 2022-07-22: 4 mg via INTRAVENOUS

## 2022-07-22 MED ORDER — SUCCINYLCHOLINE CHLORIDE 200 MG/10ML IV SOSY
PREFILLED_SYRINGE | INTRAVENOUS | Status: DC | PRN
  Administered 2022-07-22: 60 mg via INTRAVENOUS

## 2022-07-22 MED ORDER — SUGAMMADEX SODIUM 200 MG/2ML IV SOLN
INTRAVENOUS | Status: DC | PRN
  Administered 2022-07-22 (×2): 100 mg via INTRAVENOUS

## 2022-07-22 MED ORDER — DEXAMETHASONE SODIUM PHOSPHATE 10 MG/ML IJ SOLN
INTRAMUSCULAR | Status: DC | PRN
  Administered 2022-07-22: 10 mg via INTRAVENOUS

## 2022-07-22 NOTE — Op Note (Signed)
[facility] Patient Name: [name] Procedure Date : 07/22/2022 MRN: [id number] Attending MD: Clarene Essex , MD, [id number] Date of Birth: 06/12/1946 CSN: [id number] Age: 77 Admit Type: Inpatient Procedure:                ERCP Indications:              Treatment of bile leak Providers:                Clarene Essex, MD, Carlyn Reichert, RN, Frazier Richards,                            Technician Referring MD:              Medicines:                General Anesthesia Complications:            No immediate complications. Estimated Blood Loss:     Estimated blood loss was minimal. Procedure:                Pre-Anesthesia Assessment:                           - Prior to the procedure, a History and Physical                            was performed, and patient medications and                            allergies were reviewed. The patient's tolerance of                            previous anesthesia was also reviewed. The risks                            and benefits of the procedure and the sedation                            options and risks were discussed with the patient.                            All questions were answered, and informed consent                            was obtained. Prior Anticoagulants: The patient has                            taken no anticoagulant or antiplatelet agents. ASA                            Grade Assessment: II - A patient with mild systemic                            disease. After reviewing the risks and benefits,  the patient was deemed in satisfactory condition to                            undergo the procedure.                           After obtaining informed consent, the scope was                            passed under direct vision. Throughout the                            procedure, the patient's blood pressure, pulse, and                            oxygen saturations were monitored continuously. The                             [id number]-Q190V ([id number]) Olympus duodenoscope was                            introduced through the mouth, and used to inject                            contrast into and used to locate the major papilla.                            The ERCP was technically difficult and complex due                            to challenging cannulation because of abnormal                            anatomy. The patient tolerated the procedure well. Scope In: Scope Out: Findings:      The major papilla was normal. We did have some difficulty passing the       scope and we did raise her right shoulder which helped a lot the major       papilla was some distance away from a diverticulum. After a prolonged       effort we were unable to cannulate either duct so we proceeded with a       biliary pre-cut sphincterotomy was made with a Hydratome sphincterotome       using ERBE electrocautery. There was no post-sphincterotomy bleeding. At       that point we were able to get the wire to go towards the pancreas and       we elected to place one 4 Fr by 3 cm temporary plastic pancreatic stent       with a 3/4 external pigtail and no internal flaps was placed 2 cm into       the ventral pancreatic duct. The stent was in good position. The first       stent we placed fell out before we could attempt to cannulate but then       we tried another prolonged effort with multiple sphincterotome's to  cannulate next to the stent without success and we elected to try A       biliary pre-cut sphincterotomy was made with a needle knife using a       freehand technique using ERBE electrocautery. There was self limited       oozing from the sphincterotomy which did not require treatment.       Unfortunately despite this attempt we were still unable to cannulate and       after our prolonged effort we elected to stop the procedure at this       point and the scope was removed and the patient tolerated  the procedure       well there is no obvious immediate complication Impression:               - The major papilla appeared normal.                           - The major papilla was some distance away from a                            diverticulum.                           - A precut biliary sphincterotomy was performed.                           - One temporary plastic pancreatic stent was placed                            into the ventral pancreatic duct.                           - A needle-knife biliary sphincterotomy was                            performed. Recommendation:           - Clear liquid diet today. May advance tomorrow if                            okay with [place] [facility] team or surgical team whoever                            sees her first                           - Continue present medications.                           - - Repeat ERCP Next week per Dr. Benson Norway.                           - Return to GI clinic PRN.                           - Telephone GI clinic if symptomatic PRN. Will  repeat labs tomorrow and will need a follow-up                            x-ray make sure pancreatic stent passes in 1 to                            2-week unless it is no longer there at the time of                            her next ERCP Procedure Code(s):        --- Professional ---                           [phone], Esophagogastroduodenoscopy, flexible,                            transoral; diagnostic, including collection of                            specimen(s) by brushing or washing, when performed                            (separate procedure) Diagnosis Code(s):        --- Professional ---                           K83.8, Other specified diseases of biliary tract CPT copyright 2022 American [employer] Association. All rights reserved. The codes documented in this report are preliminary and upon coder review may  be revised to meet current compliance  requirements. Clarene Essex, MD 07/22/2022 5:01:10 PM This report has been signed electronically. Number of Addenda: 0

## 2022-07-22 NOTE — Progress Notes (Signed)
Progress Note  5 Days Post-Op  Subjective: Feels she is breathing better. Has noted some leaking around her drain. Tolerating diet. Passing flatus. No BM  Objective: Vital signs in last 24 hours: Temp:  [98.1 F (36.7 C)-98.5 F (36.9 C)] 98.1 F (36.7 C) (02/03 0717) Pulse Rate:  [66-87] 70 (02/03 0717) Resp:  [17-20] 18 (02/03 0717) BP: (109-147)/(72-92) 140/78 (02/03 0717) SpO2:  [89 %-95 %] 92 % (02/03 0717) Last BM Date : 07/17/22  Intake/Output from previous day: 02/02 0701 - 02/03 0700 In: -  Out: 600 [Urine:600] Intake/Output this shift: Total I/O In: -  Out: 300 [Urine:300]  PE: General: pleasant, WD, female who is laying in bed in NAD Lungs: CTAB. Respiratory effort nonlabored on room air. Oxygen saturation ~89-92% at rest Abd: soft, ND, mild TTP over incisions and around drain - greatest around drain. Incisions c/d/I. Drain with thin clear bilious fluid in bulb Psych: A&Ox3, in good spirits   Lab Results:  No results for input(s): "WBC", "HGB", "HCT", "PLT" in the last 72 hours. BMET Recent Labs    07/21/22 0543 07/22/22 0225  NA 138 140  K 3.7 3.4*  CL 103 105  CO2 27 27  GLUCOSE 102* 98  BUN 9 5*  CREATININE 0.75 0.68  CALCIUM 8.5* 8.6*   PT/INR No results for input(s): "LABPROT", "INR" in the last 72 hours. CMP     Component Value Date/Time   NA 140 07/22/2022 0225   K 3.4 (L) 07/22/2022 0225   CL 105 07/22/2022 0225   CO2 27 07/22/2022 0225   GLUCOSE 98 07/22/2022 0225   BUN 5 (L) 07/22/2022 0225   CREATININE 0.68 07/22/2022 0225   CALCIUM 8.6 (L) 07/22/2022 0225   PROT 5.3 (L) 07/21/2022 0538   ALBUMIN 2.7 (L) 07/21/2022 0538   AST 50 (H) 07/21/2022 0538   ALT 30 07/21/2022 0538   ALKPHOS 91 07/21/2022 0538   BILITOT 0.3 07/21/2022 0538   GFRNONAA >60 07/22/2022 0225   GFRAA  02/09/2010 1100    >60        The eGFR has been calculated using the MDRD equation. This calculation has not been validated in all  clinical situations. eGFR's persistently <60 mL/min signify possible Chronic Kidney Disease.   Lipase     Component Value Date/Time   LIPASE <10 (L) 07/15/2022 1708       Studies/Results: No results found.  Anti-infectives: Anti-infectives (From admission, onward)    Start     Dose/Rate Route Frequency Ordered Stop   07/16/22 1600  cefTRIAXone (ROCEPHIN) 2 g in sodium chloride 0.9 % 100 mL IVPB        2 g 200 mL/hr over 30 Minutes Intravenous Every 24 hours 07/16/22 1433 07/23/22 1559   07/15/22 2000  cefTRIAXone (ROCEPHIN) 2 g in sodium chloride 0.9 % 100 mL IVPB        2 g 200 mL/hr over 30 Minutes Intravenous  Once 07/15/22 1955 07/15/22 2115        Assessment/Plan POD 5, s/p laparoscopic subtotal cholecystectomy 1/29 Dr. Marlou Starks for cholecystitis  - continue IV abx for now and stop on dc per Dr. Marlou Starks - drain with frank bile leak - GI planning potential ERCP today - soft diet when ready per GI - encouraged ambulation and IS use. Added flutter valve and duoneb  FEN: soft  ID: rocephin VTE: lovenox  COPD - not on O2 at baseline, CTA negative for PE.  Unable to wean O2 at  this time.  HH order placed for temporary need for O2 at home. Anxiety - home med Recent 5th metacarpal fx - s/p surgery. Follow up with ortho as scheduled   LOS: 4 days   Nadeen Landau, MD [facility], [facility], [place]

## 2022-07-22 NOTE — Plan of Care (Signed)

## 2022-07-22 NOTE — Anesthesia Postprocedure Evaluation (Signed)
Anesthesia Post Note  Patient: [name]  Procedure(s) Performed: ENDOSCOPIC RETROGRADE CHOLANGIOPANCREATOGRAPHY (ERCP) SPHINCTEROTOMY PANCREATIC STENT PLACEMENT     Patient location during evaluation: PACU Anesthesia Type: General Level of consciousness: sedated and patient cooperative Pain management: pain level controlled Vital Signs Assessment: post-procedure vital signs reviewed and stable Respiratory status: spontaneous breathing Cardiovascular status: stable Anesthetic complications: no   No notable events documented.  Last Vitals:  Vitals:   07/22/22 1745 07/22/22 1935  BP: 117/66 131/77  Pulse: 92 87  Resp: 18   Temp: 36.8 C 36.8 C  SpO2: 91% 93%    Last Pain:  Vitals:   07/22/22 1935  TempSrc: Oral  PainSc:                  [name]

## 2022-07-22 NOTE — Transfer of Care (Signed)
Immediate Anesthesia Transfer of Care Note  Patient: [name]  Procedure(s) Performed: ENDOSCOPIC RETROGRADE CHOLANGIOPANCREATOGRAPHY (ERCP) SPHINCTEROTOMY PANCREATIC STENT PLACEMENT  Patient Location: PACU  Anesthesia Type:General  Level of Consciousness: awake, alert , and oriented  Airway & Oxygen Therapy: Patient Spontanous Breathing and Patient connected to nasal cannula oxygen  Post-op Assessment: Report given to RN and Post -op Vital signs reviewed and stable  Post vital signs: Reviewed and stable  Last Vitals:  Vitals Value Taken Time  BP 114/63 07/22/22 1648  Temp    Pulse 98 07/22/22 1651  Resp 17 07/22/22 1651  SpO2 88 % 07/22/22 1651  Vitals shown include unvalidated device data.  Last Pain:  Vitals:   07/22/22 1346  TempSrc: Temporal  PainSc: 0-No pain      Patients Stated Pain Goal: 3 (47/42/59 5638)  Complications: No notable events documented.

## 2022-07-22 NOTE — Anesthesia Preprocedure Evaluation (Signed)
Anesthesia Evaluation    Reviewed: Allergy & Precautions, Patient's Chart, lab work & pertinent test results, Unable to perform ROS - Chart review only  History of Anesthesia Complications Negative for: history of anesthetic complications  Airway Mallampati: II  TM Distance: >3 FB Neck ROM: Full    Dental  (+) Dental Advisory Given   Pulmonary COPD (steroids), former smoker   breath sounds clear to auscultation       Cardiovascular negative cardio ROS  Rhythm:Regular Rate:Normal     Neuro/Psych  Headaches PSYCHIATRIC DISORDERS Anxiety Depression       GI/Hepatic Neg liver ROS,,,Acute chole   Endo/Other  negative endocrine ROS    Renal/GU Renal diseaseH/o stones   H/o bladder tumor    Musculoskeletal   Abdominal   Peds  Hematology negative hematology ROS (+)   Anesthesia Other Findings   Reproductive/Obstetrics                              Anesthesia Physical Anesthesia Plan  ASA: 2  Anesthesia Plan: General   Post-op Pain Management: Minimal or no pain anticipated   Induction: Intravenous  PONV Risk Score and Plan: 3 and Ondansetron, Dexamethasone and Treatment may vary due to age or medical condition  Airway Management Planned: Oral ETT  Additional Equipment: None  Intra-op Plan:   Post-operative Plan: Extubation in OR  Informed Consent:   Plan Discussed with:   Anesthesia Plan Comments:         Anesthesia Quick Evaluation

## 2022-07-22 NOTE — Anesthesia Procedure Notes (Signed)
Procedure Name: Intubation Date/Time: 07/22/2022 2:29 PM  Performed by: Darletta Moll, CRNAPre-anesthesia Checklist: Patient identified, Emergency Drugs available, Suction available and Patient being monitored Patient Re-evaluated:Patient Re-evaluated prior to induction Oxygen Delivery Method: Circle system utilized Preoxygenation: Pre-oxygenation with 100% oxygen Induction Type: IV induction, Rapid sequence and Cricoid Pressure applied Ventilation: Unable to mask ventilate Laryngoscope Size: Mac and 3 Grade View: Grade I Tube type: Oral Tube size: 7.5 mm Number of attempts: 1 Airway Equipment and Method: Stylet and Oral airway Placement Confirmation: ETT inserted through vocal cords under direct vision, positive ETCO2 and breath sounds checked- equal and bilateral Secured at: 21 cm Tube secured with: Tape Dental Injury: Teeth and Oropharynx as per pre-operative assessment

## 2022-07-22 NOTE — Progress Notes (Signed)
[name] 2:18 PM  Subjective: Patient seen and examined in her hospital computer chart reviewed and her case discussed with Dr. Benson Norway as well as her husband and she is actually doing fairly well today we rediscussed the procedure including risk and success rate we answered all of their questions  Objective: Signs stable afebrile no acute distress in good spirits exam please see preassessment evaluation chemistry is okay CT reviewed  Assessment: Bile leak  Plan: We discussed ERCP as above we will proceed today with anesthesia assistance with further workup and plans pending those findings  [facility]  office [phone] After 5PM or if no answer call [phone]

## 2022-07-23 DIAGNOSIS — K838 Other specified diseases of biliary tract: Secondary | ICD-10-CM

## 2022-07-23 DIAGNOSIS — Z9049 Acquired absence of other specified parts of digestive tract: Secondary | ICD-10-CM

## 2022-07-23 LAB — COMPREHENSIVE METABOLIC PANEL WITH GFR
ALT: 67 U/L — ABNORMAL HIGH (ref 0–44)
AST: 73 U/L — ABNORMAL HIGH (ref 15–41)
Albumin: 3.1 g/dL — ABNORMAL LOW (ref 3.5–5.0)
Alkaline Phosphatase: 130 U/L — ABNORMAL HIGH (ref 38–126)
Anion gap: 11 (ref 5–15)
BUN: 13 mg/dL (ref 8–23)
CO2: 25 mmol/L (ref 22–32)
Calcium: 8.8 mg/dL — ABNORMAL LOW (ref 8.9–10.3)
Chloride: 102 mmol/L (ref 98–111)
Creatinine, Ser: 0.81 mg/dL (ref 0.44–1.00)
GFR, Estimated: >60 mL/min
Glucose, Bld: 111 mg/dL — ABNORMAL HIGH (ref 70–99)
Potassium: 3.6 mmol/L (ref 3.5–5.1)
Sodium: 138 mmol/L (ref 135–145)
Total Bilirubin: 0.2 mg/dL — ABNORMAL LOW (ref 0.3–1.2)
Total Protein: 6.6 g/dL (ref 6.5–8.1)

## 2022-07-23 LAB — CBC
HCT: 42.5 % (ref 36.0–46.0)
Hemoglobin: 13.6 g/dL (ref 12.0–15.0)
MCH: 30.4 pg (ref 26.0–34.0)
MCHC: 32 g/dL (ref 30.0–36.0)
MCV: 95.1 fL (ref 80.0–100.0)
Platelets: 401 K/uL — ABNORMAL HIGH (ref 150–400)
RBC: 4.47 MIL/uL (ref 3.87–5.11)
RDW: 13.4 % (ref 11.5–15.5)
WBC: 11.1 K/uL — ABNORMAL HIGH (ref 4.0–10.5)
nRBC: 0 % (ref 0.0–0.2)

## 2022-07-23 LAB — LIPASE, BLOOD: Lipase: 50 U/L (ref 11–51)

## 2022-07-23 NOTE — Progress Notes (Signed)
Progress Note  1 Day Post-Op  Subjective: Unable to get a stent in CBD yesterday with ERCP.  Having a significant amount of bilious drainage around JP drain today after procedure yesterday.  JP drain is not being overwhelmed though.  Tolerating CLD.  No significant pain  Objective: Vital signs in last 24 hours: Temp:  [97.2 F (36.2 C)-98.3 F (36.8 C)] 98.3 F (36.8 C) (02/04 0712) Pulse Rate:  [67-101] 67 (02/04 0712) Resp:  [13-20] 18 (02/04 0712) BP: (108-144)/(60-85) 122/75 (02/04 0712) SpO2:  [88 %-95 %] 95 % (02/04 0712) Weight:  [74 kg] 74 kg (02/03 1346) Last BM Date : 07/17/22  Intake/Output from previous day: 02/03 0701 - 02/04 0700 In: 800 [I.V.:800] Out: 920 [Urine:750; Drains:170] Intake/Output this shift: No intake/output data recorded.  PE: General: pleasant, WD, female who is laying in bed in NAD Lungs: CTAB. Respiratory effort nonlabored on some O2. Abd: soft, ND, mild TTP around drain.  Drain dressing saturated with bile.  This was changed. JP drain with some bilious output present. Psych: A&Ox3, in good spirits   Lab Results:  Recent Labs    07/23/22 0749  WBC 11.1*  HGB 13.6  HCT 42.5  PLT 401*   BMET Recent Labs    07/22/22 0225 07/23/22 0749  NA 140 138  K 3.4* 3.6  CL 105 102  CO2 27 25  GLUCOSE 98 111*  BUN 5* 13  CREATININE 0.68 0.81  CALCIUM 8.6* 8.8*   PT/INR No results for input(s): "LABPROT", "INR" in the last 72 hours. CMP     Component Value Date/Time   NA 138 07/23/2022 0749   K 3.6 07/23/2022 0749   CL 102 07/23/2022 0749   CO2 25 07/23/2022 0749   GLUCOSE 111 (H) 07/23/2022 0749   BUN 13 07/23/2022 0749   CREATININE 0.81 07/23/2022 0749   CALCIUM 8.8 (L) 07/23/2022 0749   PROT 6.6 07/23/2022 0749   ALBUMIN 3.1 (L) 07/23/2022 0749   AST 73 (H) 07/23/2022 0749   ALT 67 (H) 07/23/2022 0749   ALKPHOS 130 (H) 07/23/2022 0749   BILITOT 0.2 (L) 07/23/2022 0749   GFRNONAA >60 07/23/2022 0749   GFRAA  02/09/2010  1100    >60        The eGFR has been calculated using the MDRD equation. This calculation has not been validated in all clinical situations. eGFR's persistently <60 mL/min signify possible Chronic Kidney Disease.   Lipase     Component Value Date/Time   LIPASE 50 07/23/2022 0749       Studies/Results: DG ERCP  Result Date: 07/23/2022 CLINICAL DATA:  [id number] Surgery, elective [id number] EXAM: ERCP COMPARISON:  CT AP, 07/15/2022. FLUOROSCOPY: Exposure Index (as provided by the fluoroscopic device): 41.2 mGy Kerma FINDINGS: Multiple, limited oblique planar images of the RIGHT upper quadrant obtained C-arm. Images demonstrating flexible endoscopy, biliary duct cannulation and stent placement, likely pancreatic duct. No retrograde cholangiogram is submitted. IMPRESSION: Fluoroscopic imaging for ERCP and stent placement For complete description of intra procedural findings, please see performing service dictation. Electronically Signed   By: Michaelle Birks M.D.   On: 07/23/2022 09:06    Anti-infectives: Anti-infectives (From admission, onward)    Start     Dose/Rate Route Frequency Ordered Stop   07/16/22 1600  cefTRIAXone (ROCEPHIN) 2 g in sodium chloride 0.9 % 100 mL IVPB        2 g 200 mL/hr over 30 Minutes Intravenous Every 24 hours 07/16/22 1433 07/22/22 1841  07/15/22 2000  cefTRIAXone (ROCEPHIN) 2 g in sodium chloride 0.9 % 100 mL IVPB        2 g 200 mL/hr over 30 Minutes Intravenous  Once 07/15/22 1955 07/15/22 2115        Assessment/Plan POD 6, s/p laparoscopic subtotal cholecystectomy 1/29 Dr. Marlou Starks for cholecystitis - continue IV abx for now and stop on dc per Dr. Marlou Starks - ERCP yesterday.  Unable to get CBD stent in place.  Pancreatic stent placed -now with significant leakage mostly around the drain.  This is not secondary to the bulb being overwhelmed as the bulb doesn't have a significant amount present.   -TID and prn Saturation dressings.  This should improve/resolve  after CBD stent placed.  Discussed possible Eakin's pouch etc, to try to help control.  Patient would prefer to hold on this for now. -GI will repeat ERCP, timing to be determined -NPO p MN incase this can be done tomorrow - encouraged ambulation and IS use. Added flutter valve and duoneb  FEN: CLD, NPO p MN ID: rocephin VTE: lovenox  COPD - not on O2 at baseline, CTA negative for PE.  Has weaned well, but after GETA yesterday, requiring a bit today.  monitor Anxiety - home med Recent 5th metacarpal fx - s/p surgery. Follow up with ortho as scheduled   LOS: 5 days   Nadeen Landau, MD [facility], [place]

## 2022-07-23 NOTE — Progress Notes (Signed)
Progress Note for Dr. Benson Norway   Assessment    77 year old female postoperative day #6 status post subtotal cholecystectomy on 06/27/2022 by Dr. Marlou Starks for cholecystitis now with bile leak and ERCP yesterday with failed cannulation with percutaneous biliary drain in place   Recommendations   1.  Bile leak post subtotal cholecystectomy --no evidence of post ERCP complications.  We discussed the need to repeat ERCP to try to gain biliary access for decompression and ultimate treatment of her bile leak.  I have discussed this with Dr. Watt Climes and Dr. Benson Norway.  Dr. Benson Norway available for ERCP on Tuesday.  We will plan to repeat ERCP with anesthesia support on Tuesday of this week.  Patient aware of the plan. -- Okay for regular diet through Monday evening; n.p.o. after midnight Tuesday -- ERCP with general anesthesia on Tuesday with Dr. Benson Norway -- Drain management per general surgery -- Ambulation encouraged     Chief Complaint   Feels well, hungry and wants to eat more than liquids No abdominal pain other than mild tenderness around biliary drain when moving No bowel movement today Some leakage around the percutaneous biliary drain She has been able to walk around without problems Husband at bedside  Vital signs in last 24 hours: Temp:  [97.2 F (36.2 C)-98.3 F (36.8 C)] 98.3 F (36.8 C) (02/04 0712) Pulse Rate:  [67-101] 67 (02/04 0712) Resp:  [13-20] 18 (02/04 0712) BP: (108-144)/(60-85) 122/75 (02/04 0712) SpO2:  [88 %-95 %] 95 % (02/04 0712) Weight:  [74 kg] 74 kg (02/03 1346) Last BM Date : 07/17/22 Gen: awake, alert, NAD HEENT: anicteric  CV: RRR, no mrg Pulm: CTA b/l Abd: soft, NT/ND, +BS throughout, biliary drain in place right upper quadrant Ext: no c/c, trace pretibial edema Neuro: nonfocal   Intake/Output from previous day: 02/03 0701 - 02/04 0700 In: 800 [I.V.:800] Out: 920 [Urine:750; Drains:170] Intake/Output this shift: No intake/output data recorded.  Lab  Results: Recent Labs    07/23/22 0749  WBC 11.1*  HGB 13.6  HCT 42.5  PLT 401*   BMET Recent Labs    07/21/22 0543 07/22/22 0225 07/23/22 0749  NA 138 140 138  K 3.7 3.4* 3.6  CL 103 105 102  CO2 '27 27 25  '$ GLUCOSE 102* 98 111*  BUN 9 5* 13  CREATININE 0.75 0.68 0.81  CALCIUM 8.5* 8.6* 8.8*   LFT Recent Labs    07/21/22 0538 07/23/22 0749  PROT 5.3* 6.6  ALBUMIN 2.7* 3.1*  AST 50* 73*  ALT 30 67*  ALKPHOS 91 130*  BILITOT 0.3 0.2*  BILIDIR 0.1  --   IBILI 0.2*  --    PT/INR No results for input(s): "LABPROT", "INR" in the last 72 hours. Hepatitis Panel No results for input(s): "HEPBSAG", "HCVAB", "HEPAIGM", "HEPBIGM" in the last 72 hours.  Studies/Results: DG ERCP  Result Date: 07/23/2022 CLINICAL DATA:  [id number] Surgery, elective [id number] EXAM: ERCP COMPARISON:  CT AP, 07/15/2022. FLUOROSCOPY: Exposure Index (as provided by the fluoroscopic device): 41.2 mGy Kerma FINDINGS: Multiple, limited oblique planar images of the RIGHT upper quadrant obtained C-arm. Images demonstrating flexible endoscopy, biliary duct cannulation and stent placement, likely pancreatic duct. No retrograde cholangiogram is submitted. IMPRESSION: Fluoroscopic imaging for ERCP and stent placement For complete description of intra procedural findings, please see performing service dictation. Electronically Signed   By: Michaelle Birks M.D.   On: 07/23/2022 09:06      LOS: 5 days   Jerene Bears, MD 07/23/2022, 11:50  AM See Shea Evans, [facility], to contact our on call provider

## 2022-07-23 NOTE — Progress Notes (Signed)
Mobility Specialist Progress Note:   07/23/22 0940  Mobility  Activity Ambulated with assistance in hallway;Ambulated independently in hallway  Level of Assistance Modified independent, requires aide device or extra time  Assistive Device None  Distance Ambulated (ft) 300 ft  Activity Response Tolerated well  Mobility Referral Yes  $Mobility charge 1 Mobility   Pt eager for mobility session. Required x2 standing rest breaks. Tolerated ambulation on 1LO2, SpO2 90-91%. Pt back in chair with all needs met.   [name] Mobility Specialist Please contact via SecureChat or  Rehab office at [phone]

## 2022-07-23 NOTE — Progress Notes (Signed)
Old abd.binder removed and gown due to JP site drainage/leakage. Area cleansed and new drsg.applied plus new womens abd. binder (46-62) 12 in .height placed. Assisted to [facility] and back in bed . Placed in comfort level . No further needs at this time.Marland Kitchen

## 2022-07-23 NOTE — Plan of Care (Signed)

## 2022-07-23 NOTE — H&P (View-Only) (Signed)
Progress Note for Dr. Benson Norway   Assessment    77 year old female postoperative day #6 status post subtotal cholecystectomy on 06/27/2022 by Dr. Marlou Starks for cholecystitis now with bile leak and ERCP yesterday with failed cannulation with percutaneous biliary drain in place   Recommendations   1.  Bile leak post subtotal cholecystectomy --no evidence of post ERCP complications.  We discussed the need to repeat ERCP to try to gain biliary access for decompression and ultimate treatment of her bile leak.  I have discussed this with Dr. Watt Climes and Dr. Benson Norway.  Dr. Benson Norway available for ERCP on Tuesday.  We will plan to repeat ERCP with anesthesia support on Tuesday of this week.  Patient aware of the plan. -- Okay for regular diet through Monday evening; n.p.o. after midnight Tuesday -- ERCP with general anesthesia on Tuesday with Dr. Benson Norway -- Drain management per general surgery -- Ambulation encouraged     Chief Complaint   Feels well, hungry and wants to eat more than liquids No abdominal pain other than mild tenderness around biliary drain when moving No bowel movement today Some leakage around the percutaneous biliary drain She has been able to walk around without problems Husband at bedside  Vital signs in last 24 hours: Temp:  [97.2 F (36.2 C)-98.3 F (36.8 C)] 98.3 F (36.8 C) (02/04 0712) Pulse Rate:  [67-101] 67 (02/04 0712) Resp:  [13-20] 18 (02/04 0712) BP: (108-144)/(60-85) 122/75 (02/04 0712) SpO2:  [88 %-95 %] 95 % (02/04 0712) Weight:  [74 kg] 74 kg (02/03 1346) Last BM Date : 07/17/22 Gen: awake, alert, NAD HEENT: anicteric  CV: RRR, no mrg Pulm: CTA b/l Abd: soft, NT/ND, +BS throughout, biliary drain in place right upper quadrant Ext: no c/c, trace pretibial edema Neuro: nonfocal   Intake/Output from previous day: 02/03 0701 - 02/04 0700 In: 800 [I.V.:800] Out: 920 [Urine:750; Drains:170] Intake/Output this shift: No intake/output data recorded.  Lab  Results: Recent Labs    07/23/22 0749  WBC 11.1*  HGB 13.6  HCT 42.5  PLT 401*   BMET Recent Labs    07/21/22 0543 07/22/22 0225 07/23/22 0749  NA 138 140 138  K 3.7 3.4* 3.6  CL 103 105 102  CO2 '27 27 25  '$ GLUCOSE 102* 98 111*  BUN 9 5* 13  CREATININE 0.75 0.68 0.81  CALCIUM 8.5* 8.6* 8.8*   LFT Recent Labs    07/21/22 0538 07/23/22 0749  PROT 5.3* 6.6  ALBUMIN 2.7* 3.1*  AST 50* 73*  ALT 30 67*  ALKPHOS 91 130*  BILITOT 0.3 0.2*  BILIDIR 0.1  --   IBILI 0.2*  --    PT/INR No results for input(s): "LABPROT", "INR" in the last 72 hours. Hepatitis Panel No results for input(s): "HEPBSAG", "HCVAB", "HEPAIGM", "HEPBIGM" in the last 72 hours.  Studies/Results: DG ERCP  Result Date: 07/23/2022 CLINICAL DATA:  [id number] Surgery, elective [id number] EXAM: ERCP COMPARISON:  CT AP, 07/15/2022. FLUOROSCOPY: Exposure Index (as provided by the fluoroscopic device): 41.2 mGy Kerma FINDINGS: Multiple, limited oblique planar images of the RIGHT upper quadrant obtained C-arm. Images demonstrating flexible endoscopy, biliary duct cannulation and stent placement, likely pancreatic duct. No retrograde cholangiogram is submitted. IMPRESSION: Fluoroscopic imaging for ERCP and stent placement For complete description of intra procedural findings, please see performing service dictation. Electronically Signed   By: Michaelle Birks M.D.   On: 07/23/2022 09:06      LOS: 5 days   Jerene Bears, MD 07/23/2022, 11:50  AM See Shea Evans, [facility], to contact our on call provider

## 2022-07-24 LAB — COMPREHENSIVE METABOLIC PANEL
ALT: 56 U/L — ABNORMAL HIGH (ref 0–44)
AST: 56 U/L — ABNORMAL HIGH (ref 15–41)
Albumin: 2.9 g/dL — ABNORMAL LOW (ref 3.5–5.0)
Alkaline Phosphatase: 112 U/L (ref 38–126)
Anion gap: 9 (ref 5–15)
BUN: 16 mg/dL (ref 8–23)
CO2: 27 mmol/L (ref 22–32)
Calcium: 8.3 mg/dL — ABNORMAL LOW (ref 8.9–10.3)
Chloride: 101 mmol/L (ref 98–111)
Creatinine, Ser: 0.75 mg/dL (ref 0.44–1.00)
GFR, Estimated: >60 mL/min (ref >60)
Glucose, Bld: 116 mg/dL — ABNORMAL HIGH (ref 70–99)
Potassium: 3.5 mmol/L (ref 3.5–5.1)
Sodium: 137 mmol/L (ref 135–145)
Total Bilirubin: 0.6 mg/dL (ref 0.3–1.2)
Total Protein: 5.7 g/dL — ABNORMAL LOW (ref 6.5–8.1)

## 2022-07-24 LAB — CBC
HCT: 39.4 % (ref 36.0–46.0)
Hemoglobin: 12.7 g/dL (ref 12.0–15.0)
MCH: 30.8 pg (ref 26.0–34.0)
MCHC: 32.2 g/dL (ref 30.0–36.0)
MCV: 95.6 fL (ref 80.0–100.0)
Platelets: 401 10*3/uL — ABNORMAL HIGH (ref 150–400)
RBC: 4.12 MIL/uL (ref 3.87–5.11)
RDW: 13.6 % (ref 11.5–15.5)
WBC: 11.8 10*3/uL — ABNORMAL HIGH (ref 4.0–10.5)
nRBC: 0 % (ref 0.0–0.2)

## 2022-07-24 MED ORDER — BOOST / RESOURCE BREEZE PO LIQD CUSTOM
1.0000 | Freq: Three times a day (TID) | ORAL | Status: DC
  Administered 2022-07-24 – 2022-07-26 (×7): 1 via ORAL
  Filled 2022-07-24 (×2): qty 1

## 2022-07-24 MED ORDER — PROCHLORPERAZINE EDISYLATE 10 MG/2ML IJ SOLN: 10.0000 mg | Freq: Four times a day (QID) | INTRAMUSCULAR | Status: DC | PRN

## 2022-07-24 NOTE — Progress Notes (Signed)
The JP drain is filling up real quick in comparison to yesterday. At one point the surrounding dressings were soaked as well as the binder. I am having to check and empty every hour to hour and a half. Paged the [facility] to inform them for planning. She is scheduled for ERCP tomorrow but how fast the JP drain is getting full, was the reason this RN contacted the Drs.

## 2022-07-24 NOTE — Progress Notes (Signed)
Mobility Specialist Progress Note:   07/24/22 1050  Mobility  Activity Ambulated with assistance in hallway  Level of Assistance Standby assist, set-up cues, supervision of patient - no hands on  Assistive Device None  Distance Ambulated (ft) 250 ft  Activity Response Tolerated well  $Mobility charge 1 Mobility   Pt in bed willing to participate in mobility. Complaints of abdominal pain. Left in ed with call bell in reach and all needs met.   Gareth Eagle Lakechia Nay Mobility Specialist Please contact via [employer] or  Rehab Office at [phone]

## 2022-07-24 NOTE — Progress Notes (Signed)
[facility] Progress Note  2 Days Post-Op  Subjective: CC-  Thinks that she over did things yesterday. Having more abdominal discomfort today. Pain is mostly in the right hemiabodmen. She had some nausea over night, no emesis. Denies any current nausea. Passing small amount of flatus and burping.  Objective: Vital signs in last 24 hours: Temp:  [97.6 F (36.4 C)-98.9 F (37.2 C)] 98.2 F (36.8 C) (02/05 0849) Pulse Rate:  [63-79] 79 (02/05 0849) Resp:  [14-18] 16 (02/05 0849) BP: (121-160)/(68-81) 160/81 (02/05 0849) SpO2:  [93 %-94 %] 93 % (02/05 0849) Last BM Date : 07/23/22  Intake/Output from previous day: 02/04 0701 - 02/05 0700 In: -  Out: 680 [Urine:200; Drains:480] Intake/Output this shift: Total I/O In: -  Out: 75 [Drains:75]  PE: Gen:  Alert, NAD, pleasant Pulm: rate and effort normal on [place] Abd: soft, mild distension, mild diffuse tenderness most significant around the drain, scant golden bilious fluid in bulb  Lab Results:  Recent Labs    07/23/22 0749 07/24/22 0409  WBC 11.1* 11.8*  HGB 13.6 12.7  HCT 42.5 39.4  PLT 401* 401*   BMET Recent Labs    07/23/22 0749 07/24/22 0409  NA 138 137  K 3.6 3.5  CL 102 101  CO2 25 27  GLUCOSE 111* 116*  BUN 13 16  CREATININE 0.81 0.75  CALCIUM 8.8* 8.3*   PT/INR No results for input(s): "LABPROT", "INR" in the last 72 hours. CMP     Component Value Date/Time   NA 137 07/24/2022 0409   K 3.5 07/24/2022 0409   CL 101 07/24/2022 0409   CO2 27 07/24/2022 0409   GLUCOSE 116 (H) 07/24/2022 0409   BUN 16 07/24/2022 0409   CREATININE 0.75 07/24/2022 0409   CALCIUM 8.3 (L) 07/24/2022 0409   PROT 5.7 (L) 07/24/2022 0409   ALBUMIN 2.9 (L) 07/24/2022 0409   AST 56 (H) 07/24/2022 0409   ALT 56 (H) 07/24/2022 0409   ALKPHOS 112 07/24/2022 0409   BILITOT 0.6 07/24/2022 0409   GFRNONAA >60 07/24/2022 0409   GFRAA  02/09/2010 1100    >60        The eGFR has been calculated using the MDRD  equation. This calculation has not been validated in all clinical situations. eGFR's persistently <60 mL/min signify possible Chronic Kidney Disease.   Lipase     Component Value Date/Time   LIPASE 50 07/23/2022 0749       Studies/Results: DG ERCP  Result Date: 07/23/2022 CLINICAL DATA:  [id number] Surgery, elective [id number] EXAM: ERCP COMPARISON:  CT AP, 07/15/2022. FLUOROSCOPY: Exposure Index (as provided by the fluoroscopic device): 41.2 mGy Kerma FINDINGS: Multiple, limited oblique planar images of the RIGHT upper quadrant obtained C-arm. Images demonstrating flexible endoscopy, biliary duct cannulation and stent placement, likely pancreatic duct. No retrograde cholangiogram is submitted. IMPRESSION: Fluoroscopic imaging for ERCP and stent placement For complete description of intra procedural findings, please see performing service dictation. Electronically Signed   By: Michaelle Birks M.D.   On: 07/23/2022 09:06    Anti-infectives: Anti-infectives (From admission, onward)    Start     Dose/Rate Route Frequency Ordered Stop   07/16/22 1600  cefTRIAXone (ROCEPHIN) 2 g in sodium chloride 0.9 % 100 mL IVPB        2 g 200 mL/hr over 30 Minutes Intravenous Every 24 hours 07/16/22 1433 07/22/22 1841   07/15/22 2000  cefTRIAXone (ROCEPHIN) 2 g in sodium chloride 0.9 % 100 mL IVPB  2 g 200 mL/hr over 30 Minutes Intravenous  Once 07/15/22 1955 07/15/22 2115        Assessment/Plan POD 7, s/p laparoscopic subtotal cholecystectomy 1/29 Dr. Marlou Starks for cholecystitis - ERCP 2/3.  Unable to get CBD stent in place.  Pancreatic stent placed - GI planning repeat ERCP tomorrow 2/6 - having high bilious output from JP drain -TID and prn Saturation dressings.  This should improve/resolve after CBD stent placed.   - back diet down to full liquids as tolerated, NPO after MN - encouraged ambulation and IS use. flutter valve and duoneb   FEN: FLD, NPO p MN ID: none currently VTE: lovenox    COPD - not on O2 at baseline, CTA negative for PE. Continue pulm toilet, wean as able Anxiety - home med Recent 5th metacarpal fx - s/p surgery. Follow up with ortho as scheduled    LOS: 6 days    Wellington Hampshire, [facility] Surgery 07/24/2022, 9:48 AM Please see Amion for pager number during day hours 7:00am-4:30pm

## 2022-07-25 ENCOUNTER — Encounter (HOSPITAL_COMMUNITY): Payer: Self-pay

## 2022-07-25 ENCOUNTER — Inpatient Hospital Stay (HOSPITAL_COMMUNITY): Payer: Medicare HMO

## 2022-07-25 ENCOUNTER — Encounter (HOSPITAL_COMMUNITY): Admission: EM | Disposition: A | Discharge status: Home Health | Payer: Self-pay | Source: Home / Self Care

## 2022-07-25 ENCOUNTER — Inpatient Hospital Stay (HOSPITAL_COMMUNITY): Payer: Medicare HMO | Admitting: Certified Registered Nurse Anesthetist

## 2022-07-25 DIAGNOSIS — J449 Chronic obstructive pulmonary disease, unspecified: Secondary | ICD-10-CM | POA: Diagnosis not present

## 2022-07-25 DIAGNOSIS — F418 Other specified anxiety disorders: Secondary | ICD-10-CM

## 2022-07-25 DIAGNOSIS — Z87891 Personal history of nicotine dependence: Secondary | ICD-10-CM

## 2022-07-25 DIAGNOSIS — K838 Other specified diseases of biliary tract: Secondary | ICD-10-CM | POA: Diagnosis not present

## 2022-07-25 HISTORY — PX: ERCP: SHX5425

## 2022-07-25 HISTORY — PX: BILIARY STENT PLACEMENT: SHX5538

## 2022-07-25 LAB — CBC
HCT: 39 % (ref 36.0–46.0)
Hemoglobin: 12.5 g/dL (ref 12.0–15.0)
MCH: 30.1 pg (ref 26.0–34.0)
MCHC: 32.1 g/dL (ref 30.0–36.0)
MCV: 94 fL (ref 80.0–100.0)
Platelets: 354 10*3/uL (ref 150–400)
RBC: 4.15 MIL/uL (ref 3.87–5.11)
RDW: 13.7 % (ref 11.5–15.5)
WBC: 14.3 10*3/uL — ABNORMAL HIGH (ref 4.0–10.5)
nRBC: 0 % (ref 0.0–0.2)

## 2022-07-25 LAB — COMPREHENSIVE METABOLIC PANEL
ALT: 35 U/L (ref 0–44)
AST: 22 U/L (ref 15–41)
Albumin: 2.7 g/dL — ABNORMAL LOW (ref 3.5–5.0)
Alkaline Phosphatase: 86 U/L (ref 38–126)
Anion gap: 9 (ref 5–15)
BUN: 9 mg/dL (ref 8–23)
CO2: 25 mmol/L (ref 22–32)
Calcium: 8.2 mg/dL — ABNORMAL LOW (ref 8.9–10.3)
Chloride: 101 mmol/L (ref 98–111)
Creatinine, Ser: 0.76 mg/dL (ref 0.44–1.00)
GFR, Estimated: >60 mL/min (ref >60)
Glucose, Bld: 114 mg/dL — ABNORMAL HIGH (ref 70–99)
Potassium: 2.9 mmol/L — ABNORMAL LOW (ref 3.5–5.1)
Sodium: 135 mmol/L (ref 135–145)
Total Bilirubin: 0.8 mg/dL (ref 0.3–1.2)
Total Protein: 5.6 g/dL — ABNORMAL LOW (ref 6.5–8.1)

## 2022-07-25 LAB — MAGNESIUM: Magnesium: 2 mg/dL (ref 1.7–2.4)

## 2022-07-25 SURGERY — ERCP, WITH INTERVENTION IF INDICATED
Anesthesia: General

## 2022-07-25 MED ORDER — SODIUM CHLORIDE 0.9 % IV SOLN
INTRAVENOUS | Status: DC | PRN
  Administered 2022-07-25: 15 mL

## 2022-07-25 MED ORDER — PHENYLEPHRINE HCL-NACL 20-0.9 MG/250ML-% IV SOLN
INTRAVENOUS | Status: DC | PRN
  Administered 2022-07-25: 40 ug/min via INTRAVENOUS

## 2022-07-25 MED ORDER — ROCURONIUM BROMIDE 10 MG/ML (PF) SYRINGE
PREFILLED_SYRINGE | INTRAVENOUS | Status: DC | PRN
  Administered 2022-07-25: 50 mg via INTRAVENOUS

## 2022-07-25 MED ORDER — GLUCAGON HCL RDNA (DIAGNOSTIC) 1 MG IJ SOLR
INTRAMUSCULAR | Status: AC
  Filled 2022-07-25: qty 1

## 2022-07-25 MED ORDER — FENTANYL CITRATE (PF) 100 MCG/2ML IJ SOLN: 25.0000 ug | INTRAMUSCULAR | Status: DC | PRN

## 2022-07-25 MED ORDER — GLUCAGON HCL RDNA (DIAGNOSTIC) 1 MG IJ SOLR
INTRAMUSCULAR | Status: DC | PRN
  Administered 2022-07-25 (×2): .5 mg via INTRAVENOUS

## 2022-07-25 MED ORDER — ONDANSETRON HCL 4 MG/2ML IJ SOLN
INTRAMUSCULAR | Status: DC | PRN
  Administered 2022-07-25: 4 mg via INTRAVENOUS

## 2022-07-25 MED ORDER — BISACODYL 10 MG RE SUPP: 10.0000 mg | Freq: Every day | RECTAL | Status: DC | PRN

## 2022-07-25 MED ORDER — LIDOCAINE 2% (20 MG/ML) 5 ML SYRINGE
INTRAMUSCULAR | Status: DC | PRN
  Administered 2022-07-25: 100 mg via INTRAVENOUS

## 2022-07-25 MED ORDER — AMISULPRIDE (ANTIEMETIC) 5 MG/2ML IV SOLN: 10.0000 mg | Freq: Once | INTRAVENOUS | Status: DC | PRN

## 2022-07-25 MED ORDER — SODIUM CHLORIDE 0.9 % IV SOLN: INTRAVENOUS | Status: DC

## 2022-07-25 MED ORDER — DICLOFENAC SUPPOSITORY 100 MG
RECTAL | Status: AC
  Filled 2022-07-25: qty 1

## 2022-07-25 MED ORDER — SUGAMMADEX SODIUM 200 MG/2ML IV SOLN
INTRAVENOUS | Status: DC | PRN
  Administered 2022-07-25: 150 mg via INTRAVENOUS

## 2022-07-25 MED ORDER — FENTANYL CITRATE (PF) 100 MCG/2ML IJ SOLN
INTRAMUSCULAR | Status: AC
  Filled 2022-07-25: qty 2

## 2022-07-25 MED ORDER — SODIUM CHLORIDE 0.9 % IV SOLN
1.5000 g | Freq: Once | INTRAVENOUS | Status: AC
  Administered 2022-07-25: 1.5 g via INTRAVENOUS
  Filled 2022-07-25: qty 4

## 2022-07-25 MED ORDER — PHENYLEPHRINE HCL (PRESSORS) 10 MG/ML IV SOLN
INTRAVENOUS | Status: DC | PRN
  Administered 2022-07-25 (×3): 160 ug via INTRAVENOUS

## 2022-07-25 MED ORDER — FENTANYL CITRATE (PF) 250 MCG/5ML IJ SOLN
INTRAMUSCULAR | Status: DC | PRN
  Administered 2022-07-25: 50 ug via INTRAVENOUS

## 2022-07-25 MED ORDER — LACTATED RINGERS IV SOLN: INTRAVENOUS | Status: DC

## 2022-07-25 MED ORDER — POTASSIUM CHLORIDE 10 MEQ/100ML IV SOLN
10.0000 meq | INTRAVENOUS | Status: AC
  Administered 2022-07-25 (×6): 10 meq via INTRAVENOUS
  Filled 2022-07-25 (×4): qty 100

## 2022-07-25 MED ORDER — INDOMETHACIN 50 MG RE SUPP
100.0000 mg | Freq: Once | RECTAL | Status: AC
  Administered 2022-07-25: 100 mg via RECTAL
  Filled 2022-07-25 (×2): qty 2

## 2022-07-25 MED ORDER — PROPOFOL 10 MG/ML IV BOLUS
INTRAVENOUS | Status: DC | PRN
  Administered 2022-07-25: 120 mg via INTRAVENOUS

## 2022-07-25 MED ORDER — SODIUM CHLORIDE 0.9 % IV SOLN
2.0000 g | INTRAVENOUS | Status: DC
  Administered 2022-07-25 – 2022-07-27 (×3): 2 g via INTRAVENOUS
  Filled 2022-07-25 (×3): qty 20

## 2022-07-25 MED ORDER — CIPROFLOXACIN IN D5W 400 MG/200ML IV SOLN
INTRAVENOUS | Status: AC
  Filled 2022-07-25: qty 200

## 2022-07-25 NOTE — Anesthesia Procedure Notes (Signed)
Procedure Name: Intubation Date/Time: 07/25/2022 11:46 AM  Performed by: Glynda Jaeger, CRNAPre-anesthesia Checklist: Patient identified, Patient being monitored, Timeout performed, Emergency Drugs available and Suction available Patient Re-evaluated:Patient Re-evaluated prior to induction Oxygen Delivery Method: Circle System Utilized Preoxygenation: Pre-oxygenation with 100% oxygen Induction Type: IV induction Ventilation: Mask ventilation without difficulty Laryngoscope Size: Mac and 4 Grade View: Grade II Tube type: Oral Tube size: 7.5 mm Number of attempts: 1 Airway Equipment and Method: Stylet Placement Confirmation: ETT inserted through vocal cords under direct vision, positive ETCO2 and breath sounds checked- equal and bilateral Secured at: 22 cm Tube secured with: Tape Dental Injury: Teeth and Oropharynx as per pre-operative assessment

## 2022-07-25 NOTE — Anesthesia Postprocedure Evaluation (Signed)
Anesthesia Post Note  Patient: [name]  Procedure(s) Performed: ENDOSCOPIC RETROGRADE CHOLANGIOPANCREATOGRAPHY (ERCP) BILIARY STENT PLACEMENT     Patient location during evaluation: PACU Anesthesia Type: General Level of consciousness: awake and alert Pain management: pain level controlled Vital Signs Assessment: post-procedure vital signs reviewed and stable Respiratory status: spontaneous breathing, nonlabored ventilation and respiratory function stable Cardiovascular status: blood pressure returned to baseline Postop Assessment: no apparent nausea or vomiting Anesthetic complications: no   No notable events documented.  Last Vitals:  Vitals:   07/25/22 1349 07/25/22 1350  BP:  (!) 92/59  Pulse:  91  Resp:  15  Temp:    SpO2: 92% 93%    Last Pain:  Vitals:   07/25/22 1350  TempSrc:   PainSc: 0-No pain                 Marthenia Rolling

## 2022-07-25 NOTE — Progress Notes (Signed)
[facility] Progress Note  3 Days Post-Op  Subjective: CC-  Feels about the same as yesterday. Continues to have some abdominal pain and drainage around JP, no worse than yesterday. JP with 550cc output last 24 hours. Not much of an appetite but did drink Boost x3 yesterday. Passing flatus, no BM. WBC up 14.3, TMAX 100.4 Going for ERCP at 1300  Objective: Vital signs in last 24 hours: Temp:  [98.3 F (36.8 C)-100.4 F (38 C)] 98.8 F (37.1 C) (02/06 0801) Pulse Rate:  [77-86] 77 (02/06 0801) Resp:  [14-18] 18 (02/06 0801) BP: (98-111)/(59-78) 111/78 (02/06 0801) SpO2:  [92 %-96 %] 96 % (02/06 0801) Last BM Date : 07/23/22  Intake/Output from previous day: 02/05 0701 - 02/06 0700 In: 480 [P.O.:480] Out: 1300 [Urine:750; Drains:550] Intake/Output this shift: No intake/output data recorded.  PE: Gen:  Alert, NAD, pleasant Pulm: rate and effort normal on [place] Abd: soft, mild distension, mild diffuse tenderness most significant around the drain, bilious fluid in bulb  Lab Results:  Recent Labs    07/24/22 0409 07/25/22 0609  WBC 11.8* 14.3*  HGB 12.7 12.5  HCT 39.4 39.0  PLT 401* 354   BMET Recent Labs    07/24/22 0409 07/25/22 0609  NA 137 135  K 3.5 2.9*  CL 101 101  CO2 27 25  GLUCOSE 116* 114*  BUN 16 9  CREATININE 0.75 0.76  CALCIUM 8.3* 8.2*   PT/INR No results for input(s): "LABPROT", "INR" in the last 72 hours. CMP     Component Value Date/Time   NA 135 07/25/2022 0609   K 2.9 (L) 07/25/2022 0609   CL 101 07/25/2022 0609   CO2 25 07/25/2022 0609   GLUCOSE 114 (H) 07/25/2022 0609   BUN 9 07/25/2022 0609   CREATININE 0.76 07/25/2022 0609   CALCIUM 8.2 (L) 07/25/2022 0609   PROT 5.6 (L) 07/25/2022 0609   ALBUMIN 2.7 (L) 07/25/2022 0609   AST 22 07/25/2022 0609   ALT 35 07/25/2022 0609   ALKPHOS 86 07/25/2022 0609   BILITOT 0.8 07/25/2022 0609   GFRNONAA >60 07/25/2022 0609   GFRAA  02/09/2010 1100    >60        The eGFR has  been calculated using the MDRD equation. This calculation has not been validated in all clinical situations. eGFR's persistently <60 mL/min signify possible Chronic Kidney Disease.   Lipase     Component Value Date/Time   LIPASE 50 07/23/2022 0749       Studies/Results: No results found.  Anti-infectives: Anti-infectives (From admission, onward)    Start     Dose/Rate Route Frequency Ordered Stop   07/25/22 0900  cefTRIAXone (ROCEPHIN) 2 g in sodium chloride 0.9 % 100 mL IVPB        2 g 200 mL/hr over 30 Minutes Intravenous Every 24 hours 07/25/22 0725 08/01/22 0859   07/25/22 0815  ampicillin-sulbactam (UNASYN) 1.5 g in sodium chloride 0.9 % 100 mL IVPB        1.5 g 200 mL/hr over 30 Minutes Intravenous  Once 07/25/22 0724 07/25/22 0933   07/16/22 1600  cefTRIAXone (ROCEPHIN) 2 g in sodium chloride 0.9 % 100 mL IVPB        2 g 200 mL/hr over 30 Minutes Intravenous Every 24 hours 07/16/22 1433 07/22/22 1841   07/15/22 2000  cefTRIAXone (ROCEPHIN) 2 g in sodium chloride 0.9 % 100 mL IVPB        2 g 200 mL/hr over  30 Minutes Intravenous  Once 07/15/22 1955 07/15/22 2115        Assessment/Plan POD 8, s/p laparoscopic subtotal cholecystectomy 1/29 Dr. Marlou Starks for cholecystitis - ERCP 2/3.  Unable to get CBD stent in place.  Pancreatic stent placed - GI planning repeat ERCP today 2/6 - having high bilious output from JP drain - TID and prn Saturation dressings.  This should improve/resolve after CBD stent placed.  - WBC up 14.3 with TMAX 100.4. monitor, may need CT to evaluate for any undrained biloma  - encouraged ambulation and IS use. flutter valve and duoneb   FEN: NPO for procedure. Replace K and check Mag level ID: none currently VTE: lovenox   COPD - not on O2 at baseline, CTA negative for PE. Continue pulm toilet, wean as able Anxiety - home med Recent 5th metacarpal fx - s/p surgery. Follow up with ortho as scheduled    LOS: 7 days    Wellington Hampshire,  [facility] Surgery 07/25/2022, 9:35 AM Please see Amion for pager number during day hours 7:00am-4:30pm

## 2022-07-25 NOTE — Transfer of Care (Signed)
Immediate Anesthesia Transfer of Care Note  Patient: BAYLEN DEA  Procedure(s) Performed: ENDOSCOPIC RETROGRADE CHOLANGIOPANCREATOGRAPHY (ERCP) BILIARY STENT PLACEMENT  Patient Location: Endoscopy Unit  Anesthesia Type:General  Level of Consciousness: awake, alert , patient cooperative, and responds to stimulation  Airway & Oxygen Therapy: Patient Spontanous Breathing and Patient connected to nasal cannula oxygen  Post-op Assessment: Report given to RN and Post -op Vital signs reviewed and stable  Post vital signs: Reviewed and stable  Last Vitals:  Vitals Value Taken Time  BP    Temp    Pulse 92 07/25/22 1324  Resp 14 07/25/22 1324  SpO2 90 % 07/25/22 1324  Vitals shown include unvalidated device data.  Last Pain:  Vitals:   07/25/22 1102  TempSrc: Temporal  PainSc: 0-No pain      Patients Stated Pain Goal: 3 (62/70/35 0093)  Complications: No notable events documented.

## 2022-07-25 NOTE — Progress Notes (Signed)
Initial Nutrition Assessment  DOCUMENTATION CODES:   Not applicable  INTERVENTION:   - Once diet advanced, continue Boost Breeze po TID, each supplement provides 250 kcal and 9 grams of protein  - RD will monitor for further diet advancement and adjust oral nutrition supplement regimen as appropriate  NUTRITION DIAGNOSIS:   Inadequate oral intake related to inability to eat as evidenced by NPO status.  GOAL:   Patient will meet greater than or equal to 90% of their needs  MONITOR:   PO intake, Diet advancement, Supplement acceptance, Weight trends, Labs, I & O's  REASON FOR ASSESSMENT:   NPO/Clear Liquid Diet    ASSESSMENT:   77 year old female who presented to the ED on 1/27 with abdominal pain, nausea. PMH of depression, kidney stones, COPD, HLD, previous bladder cancer s/p resection, osteoporosis. Pt admitted with cholecystitis with cholelithiasis.  01/29 - s/p laparoscopic cholecystectomy, clear liquid diet 01/30 - full liquid diet 01/31 - GI soft diet 02/03 - NPO, s/p ERCP but unable to get CBD stent in place (pancreatic stent placed), clear liquid diet 02/04 - regular diet 02/05 - NPO  Noted plan for repeat ERCP today. Pt unavailable at time of RD visit x 2. Pt undergoing ERCP.  Pt with Boost Breeze currently ordered TID. Per Surgery PA note, pt consumed 3 Boost Breeze supplements yesterday. Pt is currently NPO for ERCP. RD will monitor for diet advancement and adjust oral nutrition supplement regimen as appropriate.  Medications reviewed and include: Boost Breeze TID, IV abx, IV KCl 10 mEq x 6 IVF: NS @ 20 ml/hr  Labs reviewed: potassium 2.9, WBC 14.3  UOP: 750 ml x 24 hours R abd JP drain: 550 ml x 24 hours I/O's: +2.0 L since admit  NUTRITION - FOCUSED PHYSICAL EXAM:  Unable to complete at this time. Pt undergoing ERCP.  Diet Order:   Diet Order             Diet NPO time specified  Diet effective now                   EDUCATION NEEDS:    Not appropriate for education at this time  Skin:  Skin Assessment: Skin Integrity Issues: Incisions: abd, R arm  Last BM:  07/23/22  Height:   Ht Readings from Last 1 Encounters:  07/22/22 '5\' 7"'$  (1.702 m)    Weight:   Wt Readings from Last 1 Encounters:  07/22/22 74 kg    BMI:  Body mass index is 25.55 kg/m.  Estimated Nutritional Needs:   Kcal:  1800-2000  Protein:  85-100 grams  Fluid:  1.8-2.0 L    Gustavus Bryant, MS, RD, LDN Inpatient Clinical Dietitian Please see AMiON for contact information.

## 2022-07-25 NOTE — Anesthesia Preprocedure Evaluation (Addendum)
Anesthesia Evaluation  Patient identified by MRN, date of birth, ID band Patient awake    Reviewed: Allergy & Precautions, NPO status , Patient's Chart, lab work & pertinent test results  History of Anesthesia Complications Negative for: history of anesthetic complications  Airway Mallampati: II  TM Distance: >3 FB Neck ROM: Full    Dental no notable dental hx.    Pulmonary COPD, former smoker   Pulmonary exam normal        Cardiovascular negative cardio ROS Normal cardiovascular exam     Neuro/Psych  Headaches  Anxiety Depression       GI/Hepatic Neg liver ROS,,,Bile leak post subtotal cholecystectomy   Endo/Other  negative endocrine ROS    Renal/GU K 2.9, received 75mq IV  negative genitourinary   Musculoskeletal negative musculoskeletal ROS (+)    Abdominal   Peds  Hematology negative hematology ROS (+)   Anesthesia Other Findings Day of surgery medications reviewed with patient.  Reproductive/Obstetrics negative OB ROS                              Anesthesia Physical Anesthesia Plan  ASA: 3  Anesthesia Plan: General   Post-op Pain Management: Minimal or no pain anticipated   Induction: Intravenous  PONV Risk Score and Plan: 3 and Treatment may vary due to age or medical condition, Dexamethasone and Ondansetron  Airway Management Planned: Oral ETT  Additional Equipment: None  Intra-op Plan:   Post-operative Plan: Extubation in OR  Informed Consent: I have reviewed the patients History and Physical, chart, labs and discussed the procedure including the risks, benefits and alternatives for the proposed anesthesia with the patient or authorized representative who has indicated his/her understanding and acceptance.     Dental advisory given  Plan Discussed with: CRNA  Anesthesia Plan Comments:         Anesthesia Quick Evaluation

## 2022-07-25 NOTE — Op Note (Signed)
[facility] Patient Name: [name] Procedure Date : 07/25/2022 MRN: [id number] Attending MD: Carol Ada , MD, [id number] Date of Birth: 02-10-1946 CSN: [id number] Age: 77 Admit Type: Inpatient Procedure:                ERCP Indications:              Bile leak Providers:                Carol Ada, MD, Dulcy Fanny, Faustina                            Mbumina, Technician, Tressia Miners, CRNA Referring MD:              Medicines:                General Anesthesia Complications:            No immediate complications. Estimated Blood Loss:     Estimated blood loss: none. Procedure:                Pre-Anesthesia Assessment:                           - Prior to the procedure, a History and Physical                            was performed, and patient medications and                            allergies were reviewed. The patient's tolerance of                            previous anesthesia was also reviewed. The risks                            and benefits of the procedure and the sedation                            options and risks were discussed with the patient.                            All questions were answered, and informed consent                            was obtained. Prior Anticoagulants: The patient has                            taken no anticoagulant or antiplatelet agents. ASA                            Grade Assessment: III - A patient with severe                            systemic disease. After reviewing the risks and  benefits, the patient was deemed in satisfactory                            condition to undergo the procedure.                           - Sedation was administered by an anesthesia                            professional. General anesthesia was attained.                           After obtaining informed consent, the scope was                            passed under direct vision. Throughout the                             procedure, the patient's blood pressure, pulse, and                            oxygen saturations were monitored continuously. The                            [id number]-[id number] ([id number] Olympus duodenoscope was                            introduced through the mouth, and used to inject                            contrast into and used to inject contrast into the                            bile, dorsal and ventral pancreatic ducts. The ERCP                            was performed with difficulty. The patient                            tolerated the procedure well. Scope In: Scope Out: Findings:      The major papilla was some distance away from a diverticulum. Inspection       of the major papilla revealed that biliary and pancreatic       sphincteroplasties had been performed previously. The biliary       sphincteroplasty appeared open. The pancreatic sphincteroplasty appeared       open. The bile duct was deeply cannulated with the short-nosed traction       sphincterotome. Contrast was injected. I personally interpreted the bile       duct images. There was brisk flow of contrast through the ducts. Image       quality was excellent. Contrast extended to the entire biliary tree.       Extravasation of contrast originating from the right intrahepatic       branches was observed. Revolution wire was passed into the biliary tree.  One 8.5 Fr by 5 cm plastic stent with a single external flap and a       single internal flap was placed 4 cm into the common bile duct. Bile       flowed through the stent. The stent was in good position.      The procedure was very difficult to perform. The prior sphincterotomy       and precut were identified. There was surrounding       inflammation/granulation tissue from the recent intervention. Two       orifices were identified with one being superior and to the left and one       diagonally inferior and to the right. The superior  orifice was thought       to be the CBD. The sphincterotome was able to cannulate this orifice and       the Revolution wire was passed, however, the wire would repeatedly bend       and "knuckle" at the surgical staple. After multiple tries the guidewire       was not able to be advanced. Contrast injection did not show any       opacification of the biliary tree and there was containment of the       contrast. With passage of time, the contrast did spontaneously drain. It       was noted during the procedure that there was some bile draining from       the inferior orifice. Attention was placed in this area and cannulation       was performed with ease, however, the guidewire advanced into the PD. A       two wire technique was used, but the CBD was not able to be located and       there was no further drainage of bile. The guidewire was left in the PD.       The superior orifice was cannulated again and this time the guidewire       was able to be advanced, but there was some resistance. Realtime wire       placement was requested from Radiology. While waiting for the reading       bile was again noted from the inferior orifice and the PD wire was       removed. The guidewire was left in the superior orifice. This time with       several tries the CBD was able to be cannulated. The guidewire advanced       proximally with ease. Contrast injection confirmed the wire was in the       biliary tract. There was evidence of extravasation from the gallbladder       bed. There was a copious amount of drainage from the CBD. The wire in       the superior orifice was removed. An 8.5 Fr x 5 cm plastic biliary stent       was placed without difficulty and there was a significant amount of       biliary drainage. The bile ducts were not enlarged prior to the       placement of the stent. Impression:               - The major papilla was some distance away from a  diverticulum.                           - The prior biliary sphincteroplasty appeared open.                           - The prior pancreatic sphincteroplasty appeared                            open.                           - A bile leak was found.                           - One plastic stent was placed into the common bile                            duct. Recommendation:           - Return patient to hospital ward for ongoing care.                           - Resume regular diet.                           - Routine post-op management per Surgery.                           - Removal of JP drain per Surgery.                           - ERCP with stent extraction in1-3 months.                           - Continue with antibiotics for another 5-7 days as                            there was evidence of a false tract being created. Procedure Code(s):        --- Professional ---                           [phone], Endoscopic retrograde                            cholangiopancreatography (ERCP); with placement of                            endoscopic stent into biliary or pancreatic duct,                            including pre- and post-dilation and guide wire                            passage, when performed, including sphincterotomy,  when performed, each stent Diagnosis Code(s):        --- Professional ---                           K83.9, Disease of biliary tract, unspecified                           K83.8, Other specified diseases of biliary tract CPT copyright 2022 [employer]. All rights reserved. The codes documented in this report are preliminary and upon coder review may  be revised to meet current compliance requirements. Carol Ada, MD Carol Ada, MD 07/25/2022 1:32:11 PM This report has been signed electronically. Number of Addenda: 0

## 2022-07-25 NOTE — Interval H&P Note (Signed)
History and Physical Interval Note:  07/25/2022 11:25 AM  [name]  has presented today for surgery, with the diagnosis of Bile leak post subtotal cholecystectomy.  The various methods of treatment have been discussed with the patient and family. After consideration of risks, benefits and other options for treatment, the patient has consented to  Procedure(s): ENDOSCOPIC RETROGRADE CHOLANGIOPANCREATOGRAPHY (ERCP) (N/A) as a surgical intervention.  The patient's history has been reviewed, patient examined, no change in status, stable for surgery.  I have reviewed the patient's chart and labs.  Questions were answered to the patient's satisfaction.     [name]

## 2022-07-26 ENCOUNTER — Encounter (HOSPITAL_COMMUNITY): Payer: Self-pay | Admitting: Gastroenterology

## 2022-07-26 LAB — COMPREHENSIVE METABOLIC PANEL
ALT: 30 U/L (ref 0–44)
AST: 18 U/L (ref 15–41)
Albumin: 2.5 g/dL — ABNORMAL LOW (ref 3.5–5.0)
Alkaline Phosphatase: 148 U/L — ABNORMAL HIGH (ref 38–126)
Anion gap: 12 (ref 5–15)
BUN: 8 mg/dL (ref 8–23)
CO2: 26 mmol/L (ref 22–32)
Calcium: 8.6 mg/dL — ABNORMAL LOW (ref 8.9–10.3)
Chloride: 102 mmol/L (ref 98–111)
Creatinine, Ser: 0.74 mg/dL (ref 0.44–1.00)
GFR, Estimated: >60 mL/min (ref >60)
Glucose, Bld: 116 mg/dL — ABNORMAL HIGH (ref 70–99)
Potassium: 3.5 mmol/L (ref 3.5–5.1)
Sodium: 140 mmol/L (ref 135–145)
Total Bilirubin: 0.3 mg/dL (ref 0.3–1.2)
Total Protein: 5.9 g/dL — ABNORMAL LOW (ref 6.5–8.1)

## 2022-07-26 LAB — CBC
HCT: 39 % (ref 36.0–46.0)
Hemoglobin: 13 g/dL (ref 12.0–15.0)
MCH: 31.2 pg (ref 26.0–34.0)
MCHC: 33.3 g/dL (ref 30.0–36.0)
MCV: 93.5 fL (ref 80.0–100.0)
Platelets: 366 10*3/uL (ref 150–400)
RBC: 4.17 MIL/uL (ref 3.87–5.11)
RDW: 13.7 % (ref 11.5–15.5)
WBC: 16.7 10*3/uL — ABNORMAL HIGH (ref 4.0–10.5)
nRBC: 0 % (ref 0.0–0.2)

## 2022-07-26 MED ORDER — POLYETHYLENE GLYCOL 3350 17 G PO PACK: 17.0000 g | PACK | Freq: Every day | ORAL | Status: DC | PRN

## 2022-07-26 MED ORDER — DOCUSATE SODIUM 100 MG PO CAPS
100.0000 mg | ORAL_CAPSULE | Freq: Two times a day (BID) | ORAL | Status: DC
  Administered 2022-07-26 – 2022-07-27 (×3): 100 mg via ORAL
  Filled 2022-07-26 (×3): qty 1

## 2022-07-26 NOTE — Progress Notes (Signed)
[facility] Progress Note  1 Day Post-Op  Subjective: CC-  Sitting up on edge of bed eating breakfast. Overall feeling ok. Denies new or worsening abdominal pain. Still has some intermittent RUQ pain, worse with deep inspiration. Denies n/v. Passing a lot of flatus, no BM.  JP output significantly down (95cc). Still with some leaking around drain.  Objective: Vital signs in last 24 hours: Temp:  [97.3 F (36.3 C)-98.8 F (37.1 C)] 98.8 F (37.1 C) (02/07 0914) Pulse Rate:  [74-93] 87 (02/07 0914) Resp:  [15-25] 18 (02/07 0914) BP: (88-131)/(50-67) 93/50 (02/07 0914) SpO2:  [89 %-97 %] 93 % (02/07 0914) Last BM Date : 07/19/22  Intake/Output from previous day: 02/06 0701 - 02/07 0700 In: 428.8 [IV Piggyback:428.8] Out: 545 [Urine:450; Drains:95] Intake/Output this shift: No intake/output data recorded.  PE: Gen:  Alert, NAD, pleasant Pulm: rate and effort normal on RA Abd: soft, minimal distension, mild tenderness around drain, small amount of bilious fluid in bulb  Lab Results:  Recent Labs    07/25/22 0609 07/26/22 0755  WBC 14.3* 16.7*  HGB 12.5 13.0  HCT 39.0 39.0  PLT 354 366   BMET Recent Labs    07/25/22 0609 07/26/22 0755  NA 135 140  K 2.9* 3.5  CL 101 102  CO2 25 26  GLUCOSE 114* 116*  BUN 9 8  CREATININE 0.76 0.74  CALCIUM 8.2* 8.6*   PT/INR No results for input(s): "LABPROT", "INR" in the last 72 hours. CMP     Component Value Date/Time   NA 140 07/26/2022 0755   K 3.5 07/26/2022 0755   CL 102 07/26/2022 0755   CO2 26 07/26/2022 0755   GLUCOSE 116 (H) 07/26/2022 0755   BUN 8 07/26/2022 0755   CREATININE 0.74 07/26/2022 0755   CALCIUM 8.6 (L) 07/26/2022 0755   PROT 5.9 (L) 07/26/2022 0755   ALBUMIN 2.5 (L) 07/26/2022 0755   AST 18 07/26/2022 0755   ALT 30 07/26/2022 0755   ALKPHOS 148 (H) 07/26/2022 0755   BILITOT 0.3 07/26/2022 0755   GFRNONAA >60 07/26/2022 0755   GFRAA  02/09/2010 1100    >60        The eGFR has  been calculated using the MDRD equation. This calculation has not been validated in all clinical situations. eGFR's persistently <60 mL/min signify possible Chronic Kidney Disease.   Lipase     Component Value Date/Time   LIPASE 50 07/23/2022 0749       Studies/Results: DG ERCP  Result Date: 07/25/2022 CLINICAL DATA:  77 year old female with a history of cholecystectomy and possible biliary leak EXAM: ERCP TECHNIQUE: Multiple spot images obtained with the fluoroscopic device and submitted for interpretation post-procedure. FLUOROSCOPY: Op note COMPARISON:  CT 07/15/2022, ERCP 07/22/2022 FINDINGS: Limited intraoperative fluoroscopic spot images during ERCP. Initial image demonstrates endoscope projecting over the upper abdomen with a safety wire in the pancreatic duct. Surgical drain projects over the right upper quadrant. Surgical changes of cholecystectomy. Subsequently there is placement of a safety wire into the extrahepatic biliary system with retrograde infusion of contrast partially opacifying the intrahepatic and extrahepatic biliary ducts. Final image demonstrates plastic biliary stent, as well as extraluminal contrast adjacent to the surgical drain. IMPRESSION: Limited images during ERCP demonstrates treatment of bile leak treated with plastic biliary stent. Please refer to the dictated operative report for full details of intraoperative findings and procedure. Electronically Signed   By: Corrie Mckusick D.O.   On: 07/25/2022 14:43  Anti-infectives: Anti-infectives (From admission, onward)    Start     Dose/Rate Route Frequency Ordered Stop   07/25/22 0900  cefTRIAXone (ROCEPHIN) 2 g in sodium chloride 0.9 % 100 mL IVPB        2 g 200 mL/hr over 30 Minutes Intravenous Every 24 hours 07/25/22 0725 08/01/22 0859   07/25/22 0815  ampicillin-sulbactam (UNASYN) 1.5 g in sodium chloride 0.9 % 100 mL IVPB        1.5 g 200 mL/hr over 30 Minutes Intravenous  Once 07/25/22 0724 07/25/22  0933   07/16/22 1600  cefTRIAXone (ROCEPHIN) 2 g in sodium chloride 0.9 % 100 mL IVPB        2 g 200 mL/hr over 30 Minutes Intravenous Every 24 hours 07/16/22 1433 07/22/22 1841   07/15/22 2000  cefTRIAXone (ROCEPHIN) 2 g in sodium chloride 0.9 % 100 mL IVPB        2 g 200 mL/hr over 30 Minutes Intravenous  Once 07/15/22 1955 07/15/22 2115        Assessment/Plan POD 9, s/p laparoscopic subtotal cholecystectomy 1/29 Dr. Marlou Starks for cholecystitis - ERCP 2/3 unable to get CBD stent in place, pancreatic stent placed - repeat ERCP 2/6 successful placement of plastic stent in common bile duct - JP output significantly down (95cc) - continue for now. Still with some leakage around drain - WBC up 16.7 post-procedure, but afebrile and appears well. Will discuss with MD if CT is needed given some ongoing leakage around JP and leukocytosis, but may be able to monitor today and repeat CBC in AM - encouraged ambulation and IS use. flutter valve and duoneb. Currently on RA, will ask staff to ambulate patient and ensure she does not need supplemental O2 - may be ready for discharge tomorrow   FEN: reg diet. Add colace ID: none currently VTE: lovenox   COPD - not on O2 at baseline, CTA negative for PE. Continue pulm toilet, wean as able Anxiety - home med Recent 5th metacarpal fx - s/p surgery. Follow up with ortho as scheduled    LOS: 8 days    Wellington Hampshire, [facility] 07/26/2022, 9:35 AM Please see Amion for pager number during day hours 7:00am-4:30pm

## 2022-07-26 NOTE — Plan of Care (Addendum)
Patient is [name], with no complains at this time. Reports some abdominal tenderness at the JP drain site that comes and goes. NO N/V reported and has good active bowel sounds. Reported 1 BM this morning. Dressing at the [place] drain was changed and I noticed medium saturation with green drainage of the old dressing. JP drain output was charted 7.5 ml this AM. Patient's SpO2 is 94% on RA while sitting and during walking trial it dropped up to 91% (Patient walked aprox 180 feet on the hallway with stand by assist). I've educated the patient and her husband about care of the JP drain.  1800 Called twice materials to request delivery of abdominal binder. Night shift RN, Lorriane Shire, will follow up on this.

## 2022-07-26 NOTE — Progress Notes (Signed)
Mobility Specialist - Progress Note   07/26/22 1159  Mobility  Activity Ambulated with assistance in room  Level of Assistance Contact guard assist, steadying assist  Assistive Device None  Distance Ambulated (ft) 20 ft  Activity Response Tolerated well  Mobility Referral Yes  $Mobility charge 1 Mobility   Pt received in bed and needing assistance to BR. No complaints throughout. Pt returned to bed with all needs met.   Franki Monte  Mobility Specialist Please contact via Solicitor or Rehab office at [phone]

## 2022-07-26 NOTE — Progress Notes (Signed)
Subjective: No complaints.  Feeling well post ERCP.  Objective: Vital signs in last 24 hours: Temp:  [97.3 F (36.3 C)-98.8 F (37.1 C)] 98.8 F (37.1 C) (02/07 0543) Pulse Rate:  [74-93] 81 (02/07 0543) Resp:  [15-25] 16 (02/07 0543) BP: (88-131)/(53-67) 101/60 (02/07 0543) SpO2:  [89 %-97 %] 95 % (02/07 0543) Last BM Date : 07/19/22  Intake/Output from previous day: 02/06 0701 - 02/07 0700 In: 428.8 [IV Piggyback:428.8] Out: 545 [Urine:450; Drains:95] Intake/Output this shift: No intake/output data recorded.  General appearance: alert and no distress GI: soft, non-tender; bowel sounds normal; no masses,  no organomegaly  Lab Results: Recent Labs    07/24/22 0409 07/25/22 0609 07/26/22 0755  WBC 11.8* 14.3* 16.7*  HGB 12.7 12.5 13.0  HCT 39.4 39.0 39.0  PLT 401* 354 366   BMET Recent Labs    07/24/22 0409 07/25/22 0609 07/26/22 0755  NA 137 135 140  K 3.5 2.9* 3.5  CL 101 101 102  CO2 '27 25 26  '$ GLUCOSE 116* 114* 116*  BUN '16 9 8  '$ CREATININE 0.75 0.76 0.74  CALCIUM 8.3* 8.2* 8.6*   LFT Recent Labs    07/26/22 0755  PROT 5.9*  ALBUMIN 2.5*  AST 18  ALT 30  ALKPHOS 148*  BILITOT 0.3   PT/INR No results for input(s): "LABPROT", "INR" in the last 72 hours. Hepatitis Panel No results for input(s): "HEPBSAG", "HCVAB", "HEPAIGM", "HEPBIGM" in the last 72 hours. C-Diff No results for input(s): "CDIFFTOX" in the last 72 hours. Fecal Lactopherrin No results for input(s): "FECLLACTOFRN" in the last 72 hours.  Studies/Results: DG ERCP  Result Date: 07/25/2022 CLINICAL DATA:  77 year old female with a history of cholecystectomy and possible biliary leak EXAM: ERCP TECHNIQUE: Multiple spot images obtained with the fluoroscopic device and submitted for interpretation post-procedure. FLUOROSCOPY: Op note COMPARISON:  CT 07/15/2022, ERCP 07/22/2022 FINDINGS: Limited intraoperative fluoroscopic spot images during ERCP. Initial image demonstrates endoscope  projecting over the upper abdomen with a safety wire in the pancreatic duct. Surgical drain projects over the right upper quadrant. Surgical changes of cholecystectomy. Subsequently there is placement of a safety wire into the extrahepatic biliary system with retrograde infusion of contrast partially opacifying the intrahepatic and extrahepatic biliary ducts. Final image demonstrates plastic biliary stent, as well as extraluminal contrast adjacent to the surgical drain. IMPRESSION: Limited images during ERCP demonstrates treatment of bile leak treated with plastic biliary stent. Please refer to the dictated operative report for full details of intraoperative findings and procedure. Electronically Signed   By: Corrie Mckusick D.O.   On: 07/25/2022 14:43    Medications: Scheduled:  acetaminophen  1,000 mg Oral Q6H   enoxaparin (LOVENOX) injection  40 mg Subcutaneous Q24H   feeding supplement  1 Container Oral TID BM   FLUoxetine  20 mg Oral Daily   guaiFENesin  600 mg Oral BID   Continuous:  cefTRIAXone (ROCEPHIN)  IV 2 g (07/26/22 1610)    Assessment/Plan: 1) Bile leak s/p biliary drain. 2) Leukocytosis.   She is feeling well.  There is no new abdominal pain.  She only has mild post lap chole discomfort.  Her JP drain dropped significantly down to 95 ml.  There is an elevation in her WBC, but no fever.  Plan: 1) Follow up in the office upon discharge. 2) Repeat ERCP with stent removal in 1-3 months. 3) Discharge with antibiotics for another week.  LOS: 8 days   Eara Burruel D 07/26/2022, 8:42 AM

## 2022-07-27 DIAGNOSIS — S62306A Unspecified fracture of fifth metacarpal bone, right hand, initial encounter for closed fracture: Secondary | ICD-10-CM | POA: Diagnosis not present

## 2022-07-27 DIAGNOSIS — M25641 Stiffness of right hand, not elsewhere classified: Secondary | ICD-10-CM | POA: Diagnosis not present

## 2022-07-27 LAB — CBC
HCT: 35.7 % — ABNORMAL LOW (ref 36.0–46.0)
Hemoglobin: 11.6 g/dL — ABNORMAL LOW (ref 12.0–15.0)
MCH: 30.4 pg (ref 26.0–34.0)
MCHC: 32.5 g/dL (ref 30.0–36.0)
MCV: 93.5 fL (ref 80.0–100.0)
Platelets: 352 10*3/uL (ref 150–400)
RBC: 3.82 MIL/uL — ABNORMAL LOW (ref 3.87–5.11)
RDW: 13.9 % (ref 11.5–15.5)
WBC: 12 10*3/uL — ABNORMAL HIGH (ref 4.0–10.5)
nRBC: 0 % (ref 0.0–0.2)

## 2022-07-27 MED ORDER — GUAIFENESIN ER 600 MG PO TB12: 600.0000 mg | ORAL_TABLET | Freq: Two times a day (BID) | ORAL | Status: DC | PRN

## 2022-07-27 MED ORDER — AMOXICILLIN-POT CLAVULANATE 875-125 MG PO TABS: 1.0000 | ORAL_TABLET | Freq: Two times a day (BID) | ORAL | 0 refills | Status: AC

## 2022-07-27 MED ORDER — GUAIFENESIN 100 MG/5ML PO LIQD: 5.0000 mL | ORAL | 0 refills | Status: DC | PRN

## 2022-07-27 MED ORDER — POLYETHYLENE GLYCOL 3350 17 G PO PACK: 17.0000 g | PACK | Freq: Every day | ORAL | 0 refills | Status: DC | PRN

## 2022-07-27 NOTE — Progress Notes (Signed)
Mobility Specialist - Progress Note   07/27/22 1029  Mobility  Activity Ambulated with assistance in room  Level of Assistance Standby assist, set-up cues, supervision of patient - no hands on  Assistive Device None  Distance Ambulated (ft) 250 ft  Activity Response Tolerated well  Mobility Referral Yes  $Mobility charge 1 Mobility   Pt received at [facility] agreeable to mobility. No complaints throughout session. Pt was returned to EOB with all needs met.  Franki Monte  Mobility Specialist Please contact via Solicitor or Rehab office at [phone]

## 2022-07-27 NOTE — Discharge Summary (Signed)
[facility] Discharge Summary   Patient ID: [name] MRN: [id number] DOB/AGE: [birth year] 77 y.o.  Admit date: 07/15/2022 Discharge date: 07/27/2022  Discharge Diagnosis Acute cholecystitis Postoperative bile leak  Consultants Gastroenterology  Imaging: DG ERCP  Result Date: 07/25/2022 CLINICAL DATA:  77 year old female with a history of cholecystectomy and possible biliary leak EXAM: ERCP TECHNIQUE: Multiple spot images obtained with the fluoroscopic device and submitted for interpretation post-procedure. FLUOROSCOPY: Op note COMPARISON:  CT 07/15/2022, ERCP 07/22/2022 FINDINGS: Limited intraoperative fluoroscopic spot images during ERCP. Initial image demonstrates endoscope projecting over the upper abdomen with a safety wire in the pancreatic duct. Surgical drain projects over the right upper quadrant. Surgical changes of cholecystectomy. Subsequently there is placement of a safety wire into the extrahepatic biliary system with retrograde infusion of contrast partially opacifying the intrahepatic and extrahepatic biliary ducts. Final image demonstrates plastic biliary stent, as well as extraluminal contrast adjacent to the surgical drain. IMPRESSION: Limited images during ERCP demonstrates treatment of bile leak treated with plastic biliary stent. Please refer to the dictated operative report for full details of intraoperative findings and procedure. Electronically Signed   By: Corrie Mckusick D.O.   On: 07/25/2022 14:43    Procedures Dr. Marlou Starks (07/17/2022) - LAPAROSCOPIC SUBTOTAL CHOLECYSTECTOMY  Dr. Watt Climes (07/22/2022) - ERCP Dr. Benson Norway (07/25/22) - ERCP  Hospital Course:  [name] is a 77 y.o. female who presented to the ED 1/28 with RUQ abdominal pain and nausea.  Workup showed acute cholecystitis.  Patient was admitted and was taken to the operating room. Intraoperatively she was found to have cholecystitis and the neck of the gallbladder was found to be adherent to  the stomach and duodenum; because of  this she underwent subtotal cholecystectomy and JP drain was left in place. Patient tolerated procedure well and was transferred to the floor.  Diet was advanced as tolerated.  She was noted to have bilious drainage from her JP drain therefore gastroenterology was consulted for a postoperative bile leak. She underwent ERCP 2/3 but we were unable to place a common bile duct stent. She underwent repeat ERCP 2/6 which was successful. Output from JP drain decreased. On 2/8 the patient was felt stable for discharge home.  Patient will follow up as below and knows to call with questions or concerns.      Physical Exam: Gen:  Alert, NAD, pleasant Pulm: rate and effort normal on RA Abd: soft, nondistended, nontender, small amount of thin bilious fluid in bulb (27cc/24hr)  Allergies as of 07/27/2022       Reactions   Aspirin Other (See Comments)    vasculitis        Medication List     STOP taking these medications    doxycycline 100 MG capsule Commonly known as: VIBRAMYCIN   HYDROcodone-acetaminophen 5-325 MG tablet Commonly known as: NORCO/VICODIN   ondansetron 4 MG tablet Commonly known as: ZOFRAN   oxyCODONE 5 MG immediate release tablet Commonly known as: Oxy IR/ROXICODONE   predniSONE 20 MG tablet Commonly known as: DELTASONE       TAKE these medications    acetaminophen 650 MG CR tablet Commonly known as: TYLENOL Take 650 mg by mouth as needed for pain.   amoxicillin-clavulanate 875-125 MG tablet Commonly known as: AUGMENTIN Take 1 tablet by mouth 2 (two) times daily for 7 days.   FLUoxetine 20 MG capsule Commonly known as: PROZAC TAKE 1 CAPSULE BY MOUTH EVERY DAY What changed: how much to take   guaiFENesin  600 MG 12 hr tablet Commonly known as: MUCINEX Take 1 tablet (600 mg total) by mouth 2 (two) times daily as needed for to loosen phlegm.   guaiFENesin 100 MG/5ML liquid Commonly known as: ROBITUSSIN Take 5 mLs by  mouth every 4 (four) hours as needed for cough or to loosen phlegm.   polyethylene glycol 17 g packet Commonly known as: MIRALAX / GLYCOLAX Take 17 g by mouth daily as needed for mild constipation.   vitamin C 1000 MG tablet Take 1,000 mg by mouth daily.               Durable Medical Equipment  (From admission, onward)           Start     Ordered   07/21/22 1112  For home use only DME oxygen  Once       Question Answer Comment  Length of Need 6 Months   Mode or (Route) Nasal cannula   Frequency Continuous (stationary and portable oxygen unit needed)   Oxygen delivery system Gas      07/21/22 1111              Follow-up Information     Burchette, Alinda Sierras, MD. Schedule an appointment as soon as possible for a visit.   Specialty: Family Medicine Why: to follow up after hospitalization and continue to manage oxygen needs Contact information: [place] [address] [phone]         [facility], [place] Follow up.   Why: [employer] is providing home oxygen prior to your discharge to home. Contact information: [address] [place] [phone]         Jovita Kussmaul, MD. Call.   Specialty: General Surgery Why: follow up on 2/13 please arrive at 11:15 am for 11:45 am appt to complete check in,  and bring photo ID and insurance card. Contact information: [address] [address] [phone]         Carol Ada, MD. Call.   Specialty: Gastroenterology Why: Their office should contact you with an appointment Contact information: [address] [place] [address] [phone]                  Signed: [place], [facility] 07/27/2022, 9:32 AM Please see Amion for pager number during day hours 7:00am-4:30pm

## 2022-07-28 ENCOUNTER — Telehealth: Payer: Self-pay

## 2022-07-28 ENCOUNTER — Encounter (HOSPITAL_COMMUNITY): Payer: Self-pay | Admitting: Gastroenterology

## 2022-07-28 NOTE — Patient Outreach (Signed)
  Care Coordination TOC Note Transition Care Management Follow-up Telephone Call Date of discharge and from where: 07/27/22-[facility]  Dx: "acute cholecystitis" How have you been since you were released from the hospital? Incoming call from patient returning RN CM call. She voices that she "feels good and has no pain." She rested well last night. She has been up moving around but taking it slow and easy. She ate a small breakfast. Denies any n&v. LBM was this morning. She voices drain is intact and no issues. Any questions or concerns? No  Items Reviewed: Did the pt receive and understand the discharge instructions provided? Yes  Medications obtained and verified? Yes  Other? Yes  Any new allergies since your discharge? No  Dietary orders reviewed? Yes Do you have support at home? Yes -spouse and son  [place] and Equipment/Supplies: Were home health services ordered? not applicable If so, what is the name of the agency? N/A  Has the agency set up a time to come to the patient's home? not applicable Were any new equipment or medical supplies ordered?  No-Patient states orginally she was going to need home oxygen but it was later determined she did not need it and does not have DME there. Pulse ox sats 96% RA per patient report since being home What is the name of the medical supply agency? N/A Were you able to get the supplies/equipment? not applicable Do you have any questions related to the use of the equipment or supplies? No  Functional Questionnaire: (I = Independent and D = Dependent) ADLs: I  Bathing/Dressing- I  Meal Prep- I  Eating- I  Maintaining continence- I  Transferring/Ambulation- I  Managing Meds- I  Follow up appointments reviewed:  PCP Hospital f/u appt confirmed? Yes  Scheduled to see Dr. Elease Hashimoto on 08/02/22 @ 11:30 am. [facility] f/u appt confirmed? Yes  Scheduled to see Dr. Marlou Starks on 08/01/22 @ 11:45 am. Patient awaiting GI office to call her  with appt date and time-aware to follow up with office if she does not hear from them in 2-3 business days Are transportation arrangements needed? No  If their condition worsens, is the pt aware to call PCP or go to the Emergency Dept.? Yes Was the patient provided with contact information for the PCP's office or ED? Yes Was to pt encouraged to call back with questions or concerns? Yes  SDOH assessments and interventions completed:   Yes SDOH Interventions Today    Flowsheet Row Most Recent Value  SDOH Interventions   Food Insecurity Interventions Intervention Not Indicated  Transportation Interventions Intervention Not Indicated       Care Coordination Interventions:  TOC Interventions Today    Flowsheet Row Most Recent Value  TOC Interventions   TOC Interventions Discussed/Reviewed TOC Interventions Discussed, Post discharge activity limitations per provider, Post op wound/incision care, S/S of infection       Interventions Today    Flowsheet Row Most Recent Value  Education Interventions   Education Provided Provided Verbal Education  Provided Verbal Education On Nutrition, When to see the doctor, Medication  Nutrition Interventions   Nutrition Discussed/Reviewed Nutrition Discussed        Encounter Outcome:  Pt. Visit Completed     Enzo Montgomery, RN,BSN,CCM [facility] Telephonic Care Management Coordinator Direct Phone: [phone] Toll Free: [phone] Fax: [phone]

## 2022-07-28 NOTE — Patient Outreach (Signed)
  Care Coordination TOC Note Transition Care Management Unsuccessful Follow-up Telephone Call  Date of discharge and from where:  07/27/22-[facility]  Attempts:  1st Attempt  Reason for unsuccessful TCM follow-up call:  Left voice message     Hetty Blend [place] [facility] Telephonic Care Management Coordinator Direct Phone: [phone] Toll Free: [phone] Fax: [phone]

## 2022-08-02 ENCOUNTER — Encounter: Payer: Self-pay | Admitting: Family Medicine

## 2022-08-02 ENCOUNTER — Ambulatory Visit (INDEPENDENT_AMBULATORY_CARE_PROVIDER_SITE_OTHER): Payer: Medicare HMO | Admitting: Family Medicine

## 2022-08-02 VITALS — BP 100/60 | HR 80 | Temp 97.9°F | Ht 67.0 in | Wt 159.9 lb

## 2022-08-02 DIAGNOSIS — C679 Malignant neoplasm of bladder, unspecified: Secondary | ICD-10-CM | POA: Diagnosis not present

## 2022-08-02 DIAGNOSIS — K8000 Calculus of gallbladder with acute cholecystitis without obstruction: Secondary | ICD-10-CM

## 2022-08-02 DIAGNOSIS — C678 Malignant neoplasm of overlapping sites of bladder: Secondary | ICD-10-CM | POA: Diagnosis not present

## 2022-08-02 DIAGNOSIS — R3121 Asymptomatic microscopic hematuria: Secondary | ICD-10-CM | POA: Diagnosis not present

## 2022-08-02 NOTE — Patient Instructions (Signed)
Follow up for any fever, recurrent abdominal, recurrent vomiting, or other concerns.

## 2022-08-02 NOTE — Progress Notes (Signed)
Established Patient Office Visit  Subjective   Patient ID: [name], female    DOB: Nov 17, 1945  Age: 77 y.o. MRN: [id number]  Chief Complaint  Patient presents with   Hospitalization Follow-up    HPI   Aubrielle has history of osteoporosis, bladder cancer, kidney stones, recurrent depression, hyperlipidemia, probable emphysema with longstanding past history of nicotine use.  She is seen for hospital follow-up following somewhat lengthy admission from 27 January to 8 February for acute cholecystitis with complications as below.  She underwent laparoscopic subtotal cholecystectomy on 29 January and had complication of postoperative bile leak which required attempted ERCP unsuccessfully on the third and subsequent repeat ERCP stent placement on the sixth.  She presented to the ER on the 28th with right upper quadrant pain and nausea.  This was a new symptom.  Was diagnosed with acute cholecystitis.  Taken to the OR and found to have acute cholecystitis with neck of gallbladder adherent to the stomach and duodenum.  Therefore underwent subtotal cholecystectomy and JP drain.  She was then noticed to have increased bilious drainage in her JP tube and GI consulted for postoperative bile leak.  Had 2 different ERCP procedures as above.  Eventually improved after second ERCP.  She has some generalized weakness now but appetite slowly improving.  Her albumin dropped to 2.5.  She states she saw her urologist this morning and had cystoscopy which showed no recurrent bladder tumor.  She had some blood in her urine.  Recent CT scan showed nonobstructing left-sided nephrolithiasis.  She had drain removed by surgeon yesterday and overall is improving.  No fever.  No abdominal pain.  She apparently had very low O2 sats during the hospital had CT angiogram chest on January 31st which showed no PE.  Emphysema changes were noted.  Home O2 sats usually 95 to 96% room air.  She has pending follow-up with  pulmonary.  Past Medical History:  Diagnosis Date   Anxiety    aniety attacks   Bladder tumor    Colon polyps    COPD (chronic obstructive pulmonary disease) ([facility])    09-08-2020 acute flare up with cough finished antibiotics and steroids cough resolved now   Depression    Hay fever    allergies   History of kidney stones    Migraines    not in 40 years   Wears glasses    Past Surgical History:  Procedure Laterality Date   BILIARY STENT PLACEMENT  07/25/2022   Procedure: BILIARY STENT PLACEMENT;  Surgeon: Carol Ada, MD;  Location: [facility] ENDOSCOPY;  Service: Gastroenterology;;   [place]   caesarean secion  1974, [place], [place]  [phone]   CHOLECYSTECTOMY N/A 07/17/2022   Procedure: LAPAROSCOPIC SUBTOTAL CHOLECYSTECTOMY;  Surgeon: Jovita Kussmaul, MD;  Location: [facility];  Service: General;  Laterality: N/A;   COLONOSCOPY W/ POLYPECTOMY  last done 2016    x 3    CYSTOSCOPY N/A 09/21/2020   Procedure: CYSTOSCOPY;  Surgeon: Robley Fries, MD;  Location[facility];  Service: Urology;  Laterality: N/A;   CYSTOSCOPY W/ RETROGRADES Bilateral 02/04/2020   Procedure: CYSTOSCOPY WITH RETROGRADE PYELOGRAM;  Surgeon: Lucas Mallow, MD;  [facility];  Service: Urology;  Laterality: Bilateral;   ERCP N/A 07/22/2022   Procedure: ENDOSCOPIC RETROGRADE CHOLANGIOPANCREATOGRAPHY (ERCP);  Surgeon: Clarene Essex, MD;  Location: [facility];  Service: Gastroenterology;  Laterality: N/A;   ERCP N/A 07/25/2022  Procedure: ENDOSCOPIC RETROGRADE CHOLANGIOPANCREATOGRAPHY (ERCP);  Surgeon: Carol Ada, MD;  Location: [facility];  Service: Gastroenterology;  Laterality: N/A;   FINGER FRACTURE SURGERY Right 07/11/2022   5th digit has a screw   OPEN REDUCTION INTERNAL FIXATION (ORIF) DISTAL RADIAL FRACTURE Right 11/03/2014   Procedure: OPEN REDUCTION INTERNAL FIXATION (ORIF) RIGHT DISTAL RADIAL FRACTURE WITH REPAIR AND  RECONSTRUCTION;  Surgeon: Roseanne Kaufman, MD;  Location: [facility];  Service: Orthopedics;  Laterality: Right;   PANCREATIC STENT PLACEMENT  07/22/2022   Procedure: PANCREATIC STENT PLACEMENT;  Surgeon: Clarene Essex, MD;  Location: [facility];  Service: Gastroenterology;;   Joan Mayans  07/22/2022   Procedure: Joan Mayans;  Surgeon: Clarene Essex, MD;  Location: [facility];  Service: Gastroenterology;;   TRANSURETHRAL RESECTION OF BLADDER TUMOR WITH MITOMYCIN-C N/A 02/04/2020   Procedure: TRANSURETHRAL RESECTION OF BLADDER TUMOR WITH POST OPERATIVE GEMCITABINE;  Surgeon: Lucas Mallow, MD;  Location: [facility];  Service: Urology;  Laterality: N/A;   TRANSURETHRAL RESECTION OF BLADDER TUMOR WITH MITOMYCIN-C N/A 09/21/2020   Procedure: CYSTOSCOPY WITH BLADDER FULGARATION WITH POST OP GEMCITABINE;  Surgeon: Robley Fries, MD;  Location: [facility];  Service: Urology;  Laterality: N/A;  1 HR    reports that she quit smoking about 12 years ago. Her smoking use included cigarettes. She has a 75.00 pack-year smoking history. She has never used smokeless tobacco. She reports current alcohol use of about 5.0 standard drinks of alcohol per week. She reports that she does not use drugs. family history includes Arthritis in an other family member; Colon polyps in an other family member; Depression in an other family member; Hypertension in an other family member; Migraines in an other family member; Stroke in an other family member. Allergies  Allergen Reactions   Aspirin Other (See Comments)     vasculitis    Review of Systems  Constitutional:  Positive for malaise/fatigue and weight loss. Negative for chills and fever.  Eyes:  Negative for blurred vision.  Respiratory:  Negative for shortness of breath.   Cardiovascular:  Negative for chest pain.  Gastrointestinal:  Negative for abdominal pain, blood in stool, constipation, diarrhea, melena, nausea and vomiting.   Genitourinary:  Negative for dysuria.  Neurological:  Negative for dizziness, weakness and headaches.      Objective:     BP 100/60 (BP Location: Left Arm, Patient Position: Sitting, Cuff Size: Normal)   Pulse 80   Temp 97.9 F (36.6 C) (Oral)   Ht 5' 7"$  (1.702 m)   Wt 159 lb 14.4 oz (72.5 kg)   SpO2 96%   BMI 25.04 kg/m  BP Readings from Last 3 Encounters:  08/02/22 100/60  07/27/22 130/67  06/16/22 122/70   Wt Readings from Last 3 Encounters:  08/02/22 159 lb 14.4 oz (72.5 kg)  07/22/22 163 lb 2.3 oz (74 kg)  06/16/22 163 lb 1 oz (74 kg)      Physical Exam Vitals reviewed.  Constitutional:      General: She is not in acute distress.    Appearance: She is well-developed. She is not ill-appearing.  Eyes:     Pupils: Pupils are equal, round, and reactive to light.  Neck:     Thyroid: No thyromegaly.     Vascular: No JVD.  Cardiovascular:     Rate and Rhythm: Normal rate and regular rhythm.     Heart sounds:     No gallop.  Pulmonary:     Effort: Pulmonary effort is normal.  No respiratory distress.     Breath sounds: Normal breath sounds. No wheezing or rales.  Abdominal:     General: There is no distension.     Palpations: Abdomen is soft.     Tenderness: There is no abdominal tenderness.  Musculoskeletal:     Cervical back: Neck supple.     Right lower leg: No edema.     Left lower leg: No edema.  Neurological:     Mental Status: She is alert.      No results found for any visits on 08/02/22.  Last CBC Lab Results  Component Value Date   WBC 12.0 (H) 07/27/2022   HGB 11.6 (L) 07/27/2022   HCT 35.7 (L) 07/27/2022   MCV 93.5 07/27/2022   MCH 30.4 07/27/2022   RDW 13.9 07/27/2022   PLT 352 [id number]   Last metabolic panel Lab Results  Component Value Date   GLUCOSE 116 (H) 07/26/2022   NA 140 07/26/2022   K 3.5 07/26/2022   CL 102 07/26/2022   CO2 26 07/26/2022   BUN 8 07/26/2022   CREATININE 0.74 07/26/2022   GFRNONAA >60 07/26/2022    CALCIUM 8.6 (L) 07/26/2022   PROT 5.9 (L) 07/26/2022   ALBUMIN 2.5 (L) 07/26/2022   BILITOT 0.3 07/26/2022   ALKPHOS 148 (H) 07/26/2022   AST 18 07/26/2022   ALT 30 07/26/2022   ANIONGAP 12 07/26/2022      The ASCVD Risk score (Arnett DK, et al., 2019) failed to calculate for the following reasons:   Cannot find a previous HDL lab   Cannot find a previous total cholesterol lab    Assessment & Plan:   #1 recent acute cholecystitis with complication of postoperative bile leak requiring ERCP stent placement.  Patient has seen surgeon since discharge and is slowly recovering.  She has plans to follow-up with GI and couple months to consider stent removal.  #2 history of bladder cancer.  She had recent follow-up with urologist as earlier today with repeat cystoscopy reportedly clear  #3 recurrent hypoxia during recent hospitalization.  Longstanding history of smoking.  Emphysema changes noted on recent CT angiogram of chest with no pulmonary emboli.  Stable at this time with O2 sats today 96% room air.  She has pending follow-up with pulmonary   Carolann Littler, MD

## 2022-08-03 DIAGNOSIS — M79641 Pain in right hand: Secondary | ICD-10-CM | POA: Diagnosis not present

## 2022-08-08 ENCOUNTER — Telehealth: Payer: Self-pay | Admitting: Family Medicine

## 2022-08-08 DIAGNOSIS — M79641 Pain in right hand: Secondary | ICD-10-CM | POA: Diagnosis not present

## 2022-08-08 NOTE — Telephone Encounter (Signed)
[place] to schedule their annual wellness visit. Appointment made for 08/09/22.  Barkley Boards AWV direct phone # [phone]

## 2022-08-09 ENCOUNTER — Ambulatory Visit (INDEPENDENT_AMBULATORY_CARE_PROVIDER_SITE_OTHER): Payer: Medicare HMO

## 2022-08-09 VITALS — Ht 67.0 in | Wt 156.0 lb

## 2022-08-09 DIAGNOSIS — Z0000 Encounter for general adult medical examination without abnormal findings: Secondary | ICD-10-CM | POA: Diagnosis not present

## 2022-08-09 NOTE — Progress Notes (Signed)
Subjective:   [name] is a 77 y.o. female who presents for Medicare Annual (Subsequent) preventive examination.  Review of Systems    Virtual Visit via Telephone Note  I connected with  [name] on 08/09/22 at  1:45 PM EST by telephone and verified that I am speaking with the correct person using two identifiers.  Location: Patient: Home Provider: Office Persons participating in the virtual visit: patient/Nurse Health Advisor   I discussed the limitations, risks, security and privacy concerns of performing an evaluation and management service by telephone and the availability of in person appointments. The patient expressed understanding and agreed to proceed.  Interactive audio and video telecommunications were attempted between this nurse and patient, however failed, due to patient having technical difficulties OR patient did not have access to video capability.  We continued and completed visit with audio only.  Some vital signs may be absent or patient reported.   Criselda Peaches, LPN  Cardiac Risk Factors include: advanced age (>12mn, >>70women)     Objective:    Today's Vitals   08/09/22 1350  Weight: 156 lb (70.8 kg)  Height: 5' 7"$  (1.702 m)  PainSc: 0-No pain   Body mass index is 24.43 kg/m.     08/09/2022    2:00 PM 07/16/2022    2:10 PM 07/15/2022    2:25 PM 07/18/2021   10:56 AM 09/21/2020    8:20 AM 06/28/2020    9:08 AM 02/04/2020    9:23 AM  Advanced Directives  Does Patient Have a Medical Advance Directive? Yes Yes Yes Yes Yes Yes Yes  Type of [place]aramed[place]of [place] will Living will Living will HTomp[place]Living will HFountain N' LakesLiving will [place]West BrooklynLiving will HBuffaloLiving will  Does patient want to make changes to medical advance directive?  No - Patient declined  No - Patient declined  No - Patient declined No - Patient declined   Copy of HS[place]in Chart? No - copy requested   No - copy requested No - copy requested No - copy requested No - copy requested    Current Medications (verified) Outpatient Encounter Medications as of 08/09/2022  Medication Sig   acetaminophen (TYLENOL) 650 MG CR tablet Take 650 mg by mouth as needed for pain.   Ascorbic Acid (VITAMIN C) 1000 MG tablet Take 1,000 mg by mouth daily.   FLUoxetine (PROZAC) 20 MG capsule TAKE 1 CAPSULE BY MOUTH EVERY DAY (Patient taking differently: Take 20 mg by mouth daily.)   guaiFENesin (MUCINEX) 600 MG 12 hr tablet Take 1 tablet (600 mg total) by mouth 2 (two) times daily as needed for to loosen phlegm.   guaiFENesin (ROBITUSSIN) 100 MG/5ML liquid Take 5 mLs by mouth every 4 (four) hours as needed for cough or to loosen phlegm.   No facility-administered encounter medications on file as of 08/09/2022.    Allergies (verified) Aspirin   History: Past Medical History:  Diagnosis Date   Anxiety    aniety attacks   Bladder tumor    Colon polyps    COPD (chronic obstructive pulmonary disease) [place]    09-08-2020 acute flare up with cough finished antibiotics and steroids cough resolved now   Depression    Hay fever    allergies   History of kidney stones    Migraines    not in 40 years   Wears glasses    Past  Surgical History:  Procedure Laterality Date   BILIARY STENT PLACEMENT  07/25/2022   Procedure: BILIARY STENT PLACEMENT;  Surgeon: Carol Ada, MD;  Location: [facility] ENDOSCOPY;  Service: Gastroenterology;;   BREAST BIOPSY  1990   caesarean secion  1974, [place]  [phone]   CHOLECYSTECTOMY N/A 07/17/2022   Procedure: LAPAROSCOPIC SUBTOTAL CHOLECYSTECTOMY;  Surgeon: Jovita Kussmaul, MD;  Location: [place];  Service: General;  Laterality: N/A;   COLONOSCOPY W/ POLYPECTOMY  last done 2016    x 3    CYSTOSCOPY N/A 09/21/2020   Procedure: CYSTOSCOPY;  Surgeon: Robley Fries, MD;  Location: [facility];  Service: Urology;  Laterality: N/A;   CYSTOSCOPY W/ RETROGRADES Bilateral 02/04/2020   Procedure: CYSTOSCOPY WITH RETROGRADE PYELOGRAM;  Surgeon: Lucas Mallow, MD;  Location[facility];  Service: Urology;  Laterality: Bilateral;   ERCP N/A 07/22/2022   Procedure: ENDOSCOPIC RETROGRADE CHOLANGIOPANCREATOGRAPHY (ERCP);  Surgeon: Clarene Essex, MD;  Location: [place];  Service: Gastroenterology;  Laterality: N/A;   ERCP N/A 07/25/2022   Procedure: ENDOSCOPIC RETROGRADE CHOLANGIOPANCREATOGRAPHY (ERCP);  Surgeon: Carol Ada, MD;  Location: [place];  Service: Gastroenterology;  Laterality: N/A;   FINGER FRACTURE SURGERY Right 07/11/2022   5th digit has a screw   OPEN REDUCTION INTERNAL FIXATION (ORIF) DISTAL RADIAL FRACTURE Right 11/03/2014   Procedure: OPEN REDUCTION INTERNAL FIXATION (ORIF) RIGHT DISTAL RADIAL FRACTURE WITH REPAIR AND RECONSTRUCTION;  Surgeon: Roseanne Kaufman, MD;  Location: [place];  Service: Orthopedics;  Laterality: Right;   PANCREATIC STENT PLACEMENT  07/22/2022   Procedure: PANCREATIC STENT PLACEMENT;  Surgeon: Clarene Essex, MD;  Location: [place];  Service: Gastroenterology;;   Joan Mayans  07/22/2022   Procedure: Joan Mayans;  Surgeon: Clarene Essex, MD;  Location: [place];  Service: Gastroenterology;;   TRANSURETHRAL RESECTION OF BLADDER TUMOR WITH MITOMYCIN-C N/A 02/04/2020   Procedure: TRANSURETHRAL RESECTION OF BLADDER TUMOR WITH POST OPERATIVE GEMCITABINE;  Surgeon: Lucas Mallow, MD;  Location: [place];  Service: Urology;  Laterality: N/A;   TRANSURETHRAL RESECTION OF BLADDER TUMOR WITH MITOMYCIN-C N/A 09/21/2020   Procedure: CYSTOSCOPY WITH BLADDER FULGARATION WITH POST OP GEMCITABINE;  Surgeon: Robley Fries, MD;  Location: [place];  Service: Urology;  Laterality: N/A;  1 HR   Family History  Problem Relation Age of Onset   Arthritis Other    Stroke Other     Hypertension Other    Depression Other    Migraines Other    Colon polyps Other    Breast cancer Neg Hx    Social History   Socioeconomic History   Marital status: Married    Spouse name: Not on file   Number of children: 3   Years of education: 13   Highest education level: Some college, no degree  Occupational History    Comment: Retired   Tobacco Use   Smoking status: Former    Packs/day: 1.50    Years: 50.00    Total pack years: 75.00    Types: Cigarettes    Quit date: 2012    Years since quitting: 12.1   Smokeless tobacco: Never   Tobacco comments:    Former smoker  Scientific laboratory technician Use: Never used  Substance and Sexual Activity   Alcohol use: Yes    Alcohol/week: 5.0 standard drinks of alcohol    Types: 5 Glasses of wine per week   Drug use: No   Sexual activity:  Not on file  Other Topics Concern   Not on file  Social History Narrative   Married   3 children   Social Determinants of Health   Financial Resource Strain: Low Risk  (08/09/2022)   Overall Financial Resource Strain (CARDIA)    Difficulty of Paying Living Expenses: Not hard at all  Food Insecurity: No Food Insecurity (08/09/2022)   Hunger Vital Sign    Worried About Running Out of Food in the Last Year: Never true    Ran Out of Food in the Last Year: Never true  Transportation Needs: No Transportation Needs (08/09/2022)   PRAPARE - Hydrologist (Medical): No    Lack of Transportation (Non-Medical): No  Physical Activity: Inactive (08/09/2022)   Exercise Vital Sign    Days of Exercise per Week: 0 days    Minutes of Exercise per Session: 0 min  Stress: No Stress Concern Present (08/09/2022)   [place]    Feeling of Stress : Not at all  Social Connections: Moderately Isolated (08/09/2022)   Social Connection and Isolation Panel [NHANES]    Frequency of Communication with Friends and Family: More than  three times a week    Frequency of Social Gatherings with Friends and Family: More than three times a week    Attends Religious Services: Never    Marine scientist or Organizations: No    Attends Music therapist: Never    Marital Status: Married    Tobacco Counseling Counseling given: Not Answered Tobacco comments: Former smoker   Clinical Intake:  Pre-visit preparation completed: No  Pain : No/denies pain Pain Score: 0-No pain Faces Pain Scale: No hurt  Faces Pain Scale: No hurt  BMI - recorded: 24.43 Nutritional Status: BMI of 19-24  Normal Nutritional Risks: None Diabetes: No  How often do you need to have someone help you when you read instructions, pamphlets, or other written materials from your doctor or pharmacy?: 1 - Never  Diabetic?  No  Interpreter Needed?: No  Information entered by :: Rolene Arbour LPN   Activities of Daily Living    08/09/2022    1:56 PM 07/16/2022    2:06 PM  In your present state of health, do you have any difficulty performing the following activities:  Hearing? 0 0  Vision? 0 0  Difficulty concentrating or making decisions? 0 0  Walking or climbing stairs? 0 0  Dressing or bathing? 0 0  Doing errands, shopping? 0 0  Preparing Food and eating ? N   Using the Toilet? N   In the past six months, have you accidently leaked urine? N   Do you have problems with loss of bowel control? N   Managing your Medications? N   Managing your Finances? N   Housekeeping or managing your Housekeeping? N     Patient Care Team: Eulas Post, MD as PCP - General  Indicate any recent Medical Services you may have received from other than [facility] providers in the past year (date may be approximate).     Assessment:   This is a routine wellness examination for [name].  Hearing/Vision screen Hearing Screening - Comments:: Denies hearing difficulties   Vision Screening - Comments:: Wears rx glasses - up to date with  routine eye exams with  Dr Schuyler Amor  Dietary issues and exercise activities discussed: Exercise limited by: Other - see comments (Gallbladder surgery)   Goals  Addressed               This Visit's Progress     Patient Stated (pt-stated)        I will continue to walk 3 days per week for 45-50 minutes.       Depression Screen    08/09/2022    1:55 PM 06/16/2022    7:28 AM 03/07/2022   10:25 AM 07/18/2021   10:47 AM 06/28/2020    9:10 AM 06/05/2019    1:16 PM 01/22/2018   11:17 AM  PHQ 2/9 Scores  PHQ - 2 Score 0 0 0 0 0 1 0  PHQ- 9 Score   0  0      Fall Risk    08/09/2022    1:58 PM 06/16/2022    7:28 AM 06/16/2022    6:21 AM 07/18/2021   10:52 AM 07/18/2021    7:33 AM  Fall Risk   Falls in the past year? 1 1 1 1 1  $ Number falls in past yr: 0 1 1 0 1  Comment    Fall w/o injury or medical attention   Injury with Fall? 1 0 0 0 0  Comment Fx finger on right hand. Followed by medical attention      Risk for fall due to : Impaired balance/gait History of fall(s)  No Fall Risks   Follow up Falls prevention discussed Falls evaluation completed  Falls evaluation completed     [place]:  Any stairs in or around the home? Yes  If so, are there any without handrails? No  Home free of loose throw rugs in walkways, pet beds, electrical cords, etc? Yes  Adequate lighting in your home to reduce risk of falls? Yes   ASSISTIVE DEVICES UTILIZED TO PREVENT FALLS:  Life alert? No  Use of a cane, walker or w/c? No  Grab bars in the bathroom? No  Shower chair or bench in shower? No  Elevated toilet seat or a handicapp ed toilet? No   TIMED UP AND GO:  Was the test performed? No . Audio Visit   Cognitive Function:        08/09/2022    2:00 PM 07/18/2021   10:54 AM 06/05/2019    1:18 PM  6CIT Screen  What Year? 0 points 0 points 0 points  What month? 0 points 0 points 0 points  What time? 0 points 0 points 0 points  Count back from 20 0  points 0 points 0 points  Months in reverse 0 points 0 points 0 points  Repeat phrase 0 points 0 points 0 points  Total Score 0 points 0 points 0 points    Immunizations Immunization History  Administered Date(s) Administered   Fluad Quad(high Dose 65+) 01/26/2022   Hepatitis A, Adult 05/03/2015   Influenza Split 03/14/2011, 04/29/2012, 02/20/2013   Influenza Whole 03/19/2010   Influenza, High Dose Seasonal PF 03/26/2014, 03/11/2017, 02/04/2018, 02/11/2019   Influenza-Unspecified 03/16/2015, 03/06/2016, 03/11/2017, 02/06/2020, 03/14/2021, 01/26/2022   PFIZER(Purple Top)SARS-COV-2 Vaccination 07/14/2019, 08/04/2019, 02/16/2020   PNEUMOCOCCAL CONJUGATE-20 06/16/2021   Pfizer Covid-19 Vaccine Bivalent Booster 83yr & up 01/13/2021, 06/16/2021   Pneumococcal Conjugate-13 01/02/2017, 09/01/2019   Pneumococcal Polysaccharide-23 11/03/2009   Rsv, Bivalent, Protein Subunit Rsvpref,pf (Evans Lance 03/24/2022   Td 06/19/2006, 08/27/2007   Tdap 06/06/2019   Unspecified SARS-COV-2 Vaccination 03/24/2022   Zoster Recombinat (Shingrix) 03/11/2017, 06/30/2017    TDAP status: Up to date  Flu Vaccine status: Up  to date  Pneumococcal vaccine status: Up to date  Covid-19 vaccine status: Completed vaccines  Qualifies for Shingles Vaccine? Yes   Zostavax completed Yes   Shingrix Completed?: Yes  Screening Tests Health Maintenance  Topic Date Due   COVID-19 Vaccine (7 - 2023-24 season) 08/25/2022 (Originally 05/19/2022)   COLONOSCOPY (Pts 45-51yr Insurance coverage will need to be confirmed)  08/10/2023 (Originally 07/05/2022)   DEXA SCAN  12/09/2022   Lung Cancer Screening  07/20/2023   Medicare Annual Wellness (AWV)  08/10/2023   DTaP/Tdap/Td (4 - Td or Tdap) 06/05/2029   Pneumonia Vaccine 77 Years old  Completed   INFLUENZA VACCINE  Completed   Hepatitis C Screening  Completed   Zoster Vaccines- Shingrix  Completed   HPV VACCINES  Aged Out    Health Maintenance  There are no  preventive care reminders to display for this patient.   Colorectal cancer screening: No longer required.   Mammogram status: No longer required due to Age.  Bone Density status: Completed 12/08/20. Results reflect: Bone density results: OSTEOPOROSIS. Repeat every   years.  Lung Cancer Screening: (Low Dose CT Chest recommended if Age 78-80years, 30 pack-year currently smoking OR have quit w/in 15years.) does not qualify.   Additional Screening:  Hepatitis C Screening: does qualify; Completed 01/02/17  Vision Screening: Recommended annual ophthalmology exams for early detection of glaucoma and other disorders of the eye. Is the patient up to date with their annual eye exam?  Yes  Who is the provider or what is the name of the office in which the patient attends annual eye exams? Dr GDelman CheadleIf pt is not established with a provider, would they like to be referred to a provider to establish care? No .   Dental Screening: Recommended annual dental exams for proper oral hygiene  Community Resource Referral / Chronic Care Management:  CRR required this visit?  No   CCM required this visit?  No      Plan:     I have personally reviewed and noted the following in the patient's chart:   Medical and social history Use of alcohol, tobacco or illicit drugs  Current medications and supplements including opioid prescriptions. Patient is not currently taking opioid prescriptions. Functional ability and status Nutritional status Physical activity Advanced directives List of other physicians Hospitalizations, surgeries, and ER visits in previous 12 months Vitals Screenings to include cognitive, depression, and falls Referrals and appointments  In addition, I have reviewed and discussed with patient certain preventive protocols, quality metrics, and best practice recommendations. A written personalized care plan for preventive services as well as general preventive health recommendations  were provided to patient.     BCriselda Peaches LPN   [id number]  Nurse Notes: None

## 2022-08-09 NOTE — Patient Instructions (Addendum)
[name] , Thank you for taking time to come for your Medicare Wellness Visit. I appreciate your ongoing commitment to your health goals. Please review the following plan we discussed and let me know if I can assist you in the future.   These are the goals we discussed:  Goals       Patient Stated (pt-stated)      I will continue to walk 3 days per week for 45-50 minutes.        This is a list of the screening recommended for you and due dates:  Health Maintenance  Topic Date Due   COVID-19 Vaccine (7 - 2023-24 season) 08/25/2022*   Colon Cancer Screening  08/10/2023*   DEXA scan (bone density measurement)  12/09/2022   Screening for Lung Cancer  07/20/2023   Medicare Annual Wellness Visit  08/10/2023   DTaP/Tdap/Td vaccine (4 - Td or Tdap) 06/05/2029   Pneumonia Vaccine  Completed   Flu Shot  Completed   Hepatitis C Screening: USPSTF Recommendation to screen - Ages 18-79 yo.  Completed   Zoster (Shingles) Vaccine  Completed   HPV Vaccine  Aged Out  *Topic was postponed. The date shown is not the original due date.    Advanced directives: Please bring a copy of your health care power of attorney and living will to the office to be added to your chart at your convenience.   Conditions/risks identified: None  Next appointment: Follow up in one year for your annual wellness visit    Preventive Care 65 Years and Older, Female Preventive care refers to lifestyle choices and visits with your health care provider that can promote health and wellness. What does preventive care include? A yearly physical exam. This is also called an annual well check. Dental exams once or twice a year. Routine eye exams. Ask your health care provider how often you should have your eyes checked. Personal lifestyle choices, including: Daily care of your teeth and gums. Regular physical activity. Eating a healthy diet. Avoiding tobacco and drug use. Limiting alcohol use. Practicing safe  sex. Taking low-dose aspirin every day. Taking vitamin and mineral supplements as recommended by your health care provider. What happens during an annual well check? The services and screenings done by your health care provider during your annual well check will depend on your age, overall health, lifestyle risk factors, and family history of disease. Counseling  Your health care provider may ask you questions about your: Alcohol use. Tobacco use. Drug use. Emotional well-being. Home and relationship well-being. Sexual activity. Eating habits. History of falls. Memory and ability to understand (cognition). Work and work Statistician. Reproductive health. Screening  You may have the following tests or measurements: Height, weight, and BMI. Blood pressure. Lipid and cholesterol levels. These may be checked every 5 years, or more frequently if you are over 81 years old. Skin check. Lung cancer screening. You may have this screening every year starting at age 3 if you have a 30-pack-year history of smoking and currently smoke or have quit within the past 15 years. Fecal occult blood test (FOBT) of the stool. You may have this test every year starting at age 61. Flexible sigmoidoscopy or colonoscopy. You may have a sigmoidoscopy every 5 years or a colonoscopy every 10 years starting at age 71. Hepatitis C blood test. Hepatitis B blood test. Sexually transmitted disease (STD) testing. Diabetes screening. This is done by checking your blood sugar (glucose) after you have not eaten for a  while (fasting). You may have this done every 1-3 years. Bone density scan. This is done to screen for osteoporosis. You may have this done starting at age 89. Mammogram. This may be done every 1-2 years. Talk to your health care provider about how often you should have regular mammograms. Talk with your health care provider about your test results, treatment options, and if necessary, the need for more  tests. Vaccines  Your health care provider may recommend certain vaccines, such as: Influenza vaccine. This is recommended every year. Tetanus, diphtheria, and acellular pertussis (Tdap, Td) vaccine. You may need a Td booster every 10 years. Zoster vaccine. You may need this after age 75. Pneumococcal 13-valent conjugate (PCV13) vaccine. One dose is recommended after age 53. Pneumococcal polysaccharide (PPSV23) vaccine. One dose is recommended after age 37. Talk to your health care provider about which screenings and vaccines you need and how often you need them. This information is not intended to replace advice given to you by your health care provider. Make sure you discuss any questions you have with your health care provider. Document Released: 07/02/2015 Document Revised: 02/23/2016 Document Reviewed: 04/06/2015 Elsevier Interactive Patient Education  2017 [place] Prevention in the Home Falls can cause injuries. They can happen to people of all ages. There are many things you can do to make your home safe and to help prevent falls. What can I do on the outside of my home? Regularly fix the edges of walkways and driveways and fix any cracks. Remove anything that might make you trip as you walk through a door, such as a raised step or threshold. Trim any bushes or trees on the path to your home. Use bright outdoor lighting. Clear any walking paths of anything that might make someone trip, such as rocks or tools. Regularly check to see if handrails are loose or broken. Make sure that both sides of any steps have handrails. Any raised decks and porches should have guardrails on the edges. Have any leaves, snow, or ice cleared regularly. Use sand or salt on walking paths during winter. Clean up any spills in your garage right away. This includes oil or grease spills. What can I do in the bathroom? Use night lights. Install grab bars by the toilet and in the tub and shower.  Do not use towel bars as grab bars. Use non-skid mats or decals in the tub or shower. If you need to sit down in the shower, use a plastic, non-slip stool. Keep the floor dry. Clean up any water that spills on the floor as soon as it happens. Remove soap buildup in the tub or shower regularly. Attach bath mats securely with double-sided non-slip rug tape. Do not have throw rugs and other things on the floor that can make you trip. What can I do in the bedroom? Use night lights. Make sure that you have a light by your bed that is easy to reach. Do not use any sheets or blankets that are too big for your bed. They should not hang down onto the floor. Have a firm chair that has side arms. You can use this for support while you get dressed. Do not have throw rugs and other things on the floor that can make you trip. What can I do in the kitchen? Clean up any spills right away. Avoid walking on wet floors. Keep items that you use a lot in easy-to-reach places. If you need to reach something above you,  use a strong step stool that has a grab bar. Keep electrical cords out of the way. Do not use floor polish or wax that makes floors slippery. If you must use wax, use non-skid floor wax. Do not have throw rugs and other things on the floor that can make you trip. What can I do with my stairs? Do not leave any items on the stairs. Make sure that there are handrails on both sides of the stairs and use them. Fix handrails that are broken or loose. Make sure that handrails are as long as the stairways. Check any carpeting to make sure that it is firmly attached to the stairs. Fix any carpet that is loose or worn. Avoid having throw rugs at the top or bottom of the stairs. If you do have throw rugs, attach them to the floor with carpet tape. Make sure that you have a light switch at the top of the stairs and the bottom of the stairs. If you do not have them, ask someone to add them for you. What else  can I do to help prevent falls? Wear shoes that: Do not have high heels. Have rubber bottoms. Are comfortable and fit you well. Are closed at the toe. Do not wear sandals. If you use a stepladder: Make sure that it is fully opened. Do not climb a closed stepladder. Make sure that both sides of the stepladder are locked into place. Ask someone to hold it for you, if possible. Clearly mark and make sure that you can see: Any grab bars or handrails. First and last steps. Where the edge of each step is. Use tools that help you move around (mobility aids) if they are needed. These include: Canes. Walkers. Scooters. Crutches. Turn on the lights when you go into a dark area. Replace any light bulbs as soon as they burn out. Set up your furniture so you have a clear path. Avoid moving your furniture around. If any of your floors are uneven, fix them. If there are any pets around you, be aware of where they are. Review your medicines with your doctor. Some medicines can make you feel dizzy. This can increase your chance of falling. Ask your doctor what other things that you can do to help prevent falls. This information is not intended to replace advice given to you by your health care provider. Make sure you discuss any questions you have with your health care provider. Document Released: 04/01/2009 Document Revised: 11/11/2015 Document Reviewed: 07/10/2014 Elsevier Interactive Patient Education  2017 [employer].

## 2022-08-10 DIAGNOSIS — Z809 Family history of malignant neoplasm, unspecified: Secondary | ICD-10-CM | POA: Diagnosis not present

## 2022-08-10 DIAGNOSIS — Z8249 Family history of ischemic heart disease and other diseases of the circulatory system: Secondary | ICD-10-CM | POA: Diagnosis not present

## 2022-08-10 DIAGNOSIS — Z8551 Personal history of malignant neoplasm of bladder: Secondary | ICD-10-CM | POA: Diagnosis not present

## 2022-08-10 DIAGNOSIS — Z823 Family history of stroke: Secondary | ICD-10-CM | POA: Diagnosis not present

## 2022-08-10 DIAGNOSIS — Z9181 History of falling: Secondary | ICD-10-CM | POA: Diagnosis not present

## 2022-08-10 DIAGNOSIS — Z833 Family history of diabetes mellitus: Secondary | ICD-10-CM | POA: Diagnosis not present

## 2022-08-10 DIAGNOSIS — M8580 Other specified disorders of bone density and structure, unspecified site: Secondary | ICD-10-CM | POA: Diagnosis not present

## 2022-08-10 DIAGNOSIS — Z886 Allergy status to analgesic agent status: Secondary | ICD-10-CM | POA: Diagnosis not present

## 2022-08-10 DIAGNOSIS — R69 Illness, unspecified: Secondary | ICD-10-CM | POA: Diagnosis not present

## 2022-08-10 DIAGNOSIS — Z825 Family history of asthma and other chronic lower respiratory diseases: Secondary | ICD-10-CM | POA: Diagnosis not present

## 2022-08-10 DIAGNOSIS — Z87891 Personal history of nicotine dependence: Secondary | ICD-10-CM | POA: Diagnosis not present

## 2022-08-14 ENCOUNTER — Encounter: Payer: Self-pay | Admitting: Pulmonary Disease

## 2022-08-14 ENCOUNTER — Ambulatory Visit: Payer: Medicare HMO | Admitting: Pulmonary Disease

## 2022-08-14 VITALS — BP 118/70 | HR 71 | Ht 67.0 in | Wt 160.0 lb

## 2022-08-14 DIAGNOSIS — J432 Centrilobular emphysema: Secondary | ICD-10-CM

## 2022-08-14 NOTE — Progress Notes (Unsigned)
Synopsis: Referred in February 2024 for COPD exacerbation by Richard Miu, PA  Subjective:   PATIENT ID: [name] GENDER: female DOB: 03-20-1946, MRN: [id number]  HPI  Chief Complaint  Patient presents with   Consult    Referred by hospital for COPD exacerbation. States she has been doing well since last being home.    [name] is a 77 year old woman, former smoker with history of COPD who is referred to pulmonary clinic for COPD exacerbation.   She was admitted 1/27 to 2/8 for acute cholecystitis and developed issues with her breathing and was treated for COPD exacerbation.   She reports every fall she gets sick with cough, wheezing and shortness of breath which usually lasts for 3 weeks. She has not been on inhaler therapy in the past.   She is a former smoker with 75 pack year history, quit in 2012. She has no history of dust or chemical exposure. She performed secretarial work. She livers with her husband and son. She does not have pets. Family history of COPD/Asthma in one sister and rheumatoid arthritis in another sister.    Past Medical History:  Diagnosis Date   Anxiety    aniety attacks   Bladder tumor    Colon polyps    COPD (chronic obstructive pulmonary disease) (Min[place])    09-08-2020 acute flare up with cough finished antibiotics and steroids cough resolved now   Depression    Hay fever    allergies   History of kidney stones    Migraines    not in 40 years   Wears glasses      Family History  Problem Relation Age of Onset   Arthritis Other    Stroke Other    Hypertension Other    Depression Other    Migraines Other    Colon polyps Other    Breast cancer Neg Hx      Social History   Socioeconomic History   Marital status: Married    Spouse name: Not on file   Number of children: 3   Years of education: 13   Highest education level: Some college, no degree  Occupational History    Comment: Retired   Tobacco Use   Smoking status:  Former    Packs/day: 1.50    Years: 50.00    Total pack years: 75.00    Types: Cigarettes    Quit date: 2012    Years since quitting: 12.1   Smokeless tobacco: Never   Tobacco comments:    Former smoker  Scientific laboratory technician Use: Never used  Substance and Sexual Activity   Alcohol use: Yes    Alcohol/week: 5.0 standard drinks of alcohol    Types: 5 Glasses of wine per week   Drug use: No   Sexual activity: Not on file  Other Topics Concern   Not on file  Social History Narrative   Married   3 children   Social Determinants of Health   Financial Resource Strain: Low Risk  (08/09/2022)   Overall Financial Resource Strain (CARDIA)    Difficulty of Paying Living Expenses: Not hard at all  Food Insecurity: No Food Insecurity (08/09/2022)   Hunger Vital Sign    Worried About Running Out of Food in the Last Year: Never true    Ran Out of Food in the Last Year: Never true  Transportation Needs: No Transportation Needs (08/09/2022)   PRAPARE - Transportation    Lack of  Transportation (Medical): No    Lack of Transportation (Non-Medical): No  Physical Activity: Inactive (08/09/2022)   Exercise Vital Sign    Days of Exercise per Week: 0 days    Minutes of Exercise per Session: 0 min  Stress: No Stress Concern Present (08/09/2022)   [place]    Feeling of Stress : Not at all  Social Connections: Moderately Isolated (08/09/2022)   Social Connection and Isolation Panel [NHANES]    Frequency of Communication with Friends and Family: More than three times a week    Frequency of Social Gatherings with Friends and Family: More than three times a week    Attends Religious Services: Never    Marine scientist or Organizations: No    Attends Archivist Meetings: Never    Marital Status: Married  Human resources officer Violence: Not At Risk (08/09/2022)   Humiliation, Afraid, Rape, and Kick questionnaire    Fear of  Current or Ex-Partner: No    Emotionally Abused: No    Physically Abused: No    Sexually Abused: No     Allergies  Allergen Reactions   Aspirin Other (See Comments)     vasculitis     Outpatient Medications Prior to Visit  Medication Sig Dispense Refill   Ascorbic Acid (VITAMIN C) 1000 MG tablet Take 1,000 mg by mouth daily.     Calcium Carbonate (CALCIUM 600 PO) Take 600 mg by mouth daily in the afternoon.     FLUoxetine (PROZAC) 20 MG capsule TAKE 1 CAPSULE BY MOUTH EVERY DAY (Patient taking differently: Take 20 mg by mouth daily.) 90 capsule 3   guaiFENesin (MUCINEX) 600 MG 12 hr tablet Take 1 tablet (600 mg total) by mouth 2 (two) times daily as needed for to loosen phlegm.     VITAMIN D PO Take by mouth.     acetaminophen (TYLENOL) 650 MG CR tablet Take 650 mg by mouth as needed for pain.     guaiFENesin (ROBITUSSIN) 100 MG/5ML liquid Take 5 mLs by mouth every 4 (four) hours as needed for cough or to loosen phlegm. 120 mL 0   No facility-administered medications prior to visit.   Review of Systems  Constitutional:  Negative for chills, fever, malaise/fatigue and weight loss.  HENT:  Negative for congestion, sinus pain and sore throat.   Eyes: Negative.   Respiratory:  Positive for cough, sputum production and shortness of breath. Negative for hemoptysis and wheezing.   Cardiovascular:  Negative for chest pain, palpitations, orthopnea, claudication and leg swelling.  Gastrointestinal:  Negative for abdominal pain, heartburn, nausea and vomiting.  Genitourinary: Negative.   Musculoskeletal:  Negative for joint pain and myalgias.  Skin:  Negative for rash.  Neurological:  Negative for weakness.  Endo/Heme/Allergies: Negative.   Psychiatric/Behavioral:  Positive for depression.    Objective:   Vitals:   08/14/22 0958  BP: 118/70  Pulse: 71  SpO2: 96%  Weight: 160 lb (72.6 kg)  Height: '5\' 7"'$  (1.702 m)     Physical Exam Constitutional:      General: She is not in  acute distress.    Appearance: She is not ill-appearing.  HENT:     Head: Normocephalic and atraumatic.  Eyes:     General: No scleral icterus.    Conjunctiva/sclera: Conjunctivae normal.     Pupils: Pupils are equal, round, and reactive to light.  Cardiovascular:     Rate and Rhythm: Normal rate and regular  rhythm.     Pulses: Normal pulses.     Heart sounds: Normal heart sounds. No murmur heard. Pulmonary:     Effort: Pulmonary effort is normal.     Breath sounds: Normal breath sounds. No wheezing, rhonchi or rales.  Abdominal:     General: Bowel sounds are normal.     Palpations: Abdomen is soft.  Musculoskeletal:     Right lower leg: No edema.     Left lower leg: No edema.  Lymphadenopathy:     Cervical: No cervical adenopathy.  Skin:    General: Skin is warm and dry.  Neurological:     General: No focal deficit present.     Mental Status: She is alert.  Psychiatric:        Mood and Affect: Mood normal.        Behavior: Behavior normal.        Thought Content: Thought content normal.        Judgment: Judgment normal.    CBC    Component Value Date/Time   WBC 12.0 (H) 07/27/2022 0342   RBC 3.82 (L) 07/27/2022 0342   HGB 11.6 (L) 07/27/2022 0342   HCT 35.7 (L) 07/27/2022 0342   PLT 352 07/27/2022 0342   MCV 93.5 07/27/2022 0342   MCH 30.4 07/27/2022 0342   MCHC 32.5 07/27/2022 0342   RDW 13.9 07/27/2022 0342   LYMPHSABS 1.1 07/15/2022 1708   MONOABS 1.6 (H) 07/15/2022 1708   EOSABS 0.0 07/15/2022 1708   BASOSABS 0.0 07/15/2022 1708      Latest Ref Rng & Units 07/26/2022    7:55 AM 07/25/2022    6:09 AM 07/24/2022    4:09 AM  BMP  Glucose 70 - 99 mg/dL 116  114  116   BUN 8 - 23 mg/dL '8  9  16   '$ Creatinine 0.44 - 1.00 mg/dL 0.74  0.76  0.75   Sodium 135 - 145 mmol/L 140  135  137   Potassium 3.5 - 5.1 mmol/L 3.5  2.9  3.5   Chloride 98 - 111 mmol/L 102  101  101   CO2 22 - 32 mmol/L '26  25  27   '$ Calcium 8.9 - 10.3 mg/dL 8.6  8.2  8.3    Chest  imaging: CTA Chest 07/19/22 Cardiovascular: There are no filling defects within the pulmonary arteries to suggest pulmonary embolus. Mild aortic atherosclerosis and tortuosity. No aneurysm or acute aortic findings. The heart is normal in size. No pericardial effusion.   Mediastinum/Nodes: No mediastinal or hilar adenopathy. Decompressed esophagus.   Lungs/Pleura: Moderate emphysema. Bandlike atelectasis within the lingula, right middle lobe in both lower lobes. There is also dependent atelectasis in the right greater than left lower lobe. No significant effusion. The small pulmonary nodules on prior lung cancer screening CT are obscured by motion on the current exam. No obvious pulmonary mass. No features of pulmonary edema.  PFT:     No data to display          Labs:  Path:  Echo:  Heart Catheterization:  Assessment & Plan:   Centrilobular emphysema ([place]) - Plan: Pulmonary Function Test  Discussion: [name] is a 77 year old woman, former smoker with history of COPD who is referred to pulmonary clinic for COPD exacerbation.   She has recovered well since her recent admission. She has not acute issues with her breathing at this time. She would like to hold off on maintenance therapy at this time  until we have pulmonary function testing done.   Follow up in 4 months with PFTs.  Freda Jackson, MD [facility] Office: [phone]   Current Outpatient Medications:    Ascorbic Acid (VITAMIN C) 1000 MG tablet, Take 1,000 mg by mouth daily., Disp: , Rfl:    Calcium Carbonate (CALCIUM 600 PO), Take 600 mg by mouth daily in the afternoon., Disp: , Rfl:    FLUoxetine (PROZAC) 20 MG capsule, TAKE 1 CAPSULE BY MOUTH EVERY DAY (Patient taking differently: Take 20 mg by mouth daily.), Disp: 90 capsule, Rfl: 3   guaiFENesin (MUCINEX) 600 MG 12 hr tablet, Take 1 tablet (600 mg total) by mouth 2 (two) times daily as needed for to loosen phlegm., Disp:  , Rfl:    VITAMIN D PO, Take by mouth., Disp: , Rfl:

## 2022-08-14 NOTE — Patient Instructions (Addendum)
Your CT scan shows emphyema which is a form of COPD  I am glad your breathing is better since the hospitalization.  We will hold off on inhaler therapy at this time  We will check pulmonary function tests in 4 months at your follow up visit

## 2022-08-17 ENCOUNTER — Encounter: Payer: Self-pay | Admitting: Pulmonary Disease

## 2022-08-17 DIAGNOSIS — Z4789 Encounter for other orthopedic aftercare: Secondary | ICD-10-CM | POA: Diagnosis not present

## 2022-08-17 DIAGNOSIS — M79641 Pain in right hand: Secondary | ICD-10-CM | POA: Diagnosis not present

## 2022-08-17 DIAGNOSIS — S62306A Unspecified fracture of fifth metacarpal bone, right hand, initial encounter for closed fracture: Secondary | ICD-10-CM | POA: Diagnosis not present

## 2022-09-20 DIAGNOSIS — K832 Perforation of bile duct: Secondary | ICD-10-CM | POA: Diagnosis not present

## 2022-09-28 ENCOUNTER — Encounter: Payer: Self-pay | Admitting: Adult Health

## 2022-09-28 ENCOUNTER — Telehealth (INDEPENDENT_AMBULATORY_CARE_PROVIDER_SITE_OTHER): Payer: Medicare HMO | Admitting: Adult Health

## 2022-09-28 VITALS — Temp 98.4°F | Ht 67.0 in | Wt 156.0 lb

## 2022-09-28 DIAGNOSIS — U071 COVID-19: Secondary | ICD-10-CM

## 2022-09-28 MED ORDER — NIRMATRELVIR/RITONAVIR (PAXLOVID)TABLET: 3.0000 | ORAL_TABLET | Freq: Two times a day (BID) | ORAL | 0 refills | Status: AC

## 2022-09-28 NOTE — Progress Notes (Signed)
Virtual Visit via Video Note  I connected with Shalu Jankowski on 09/28/22 at  3:45 PM EDT by a video enabled telemedicine application and verified that I am speaking with the correct person using two identifiers.  Location patient: home Location provider:work or home office Persons participating in the virtual visit: patient, provider  I discussed the limitations of evaluation and management by telemedicine and the availability of in person appointments. The patient expressed understanding and agreed to proceed.   HPI: 77 year old female who is being evaluated today for an acute issue.  She reports that last night when she went to bed she woke up chills and a sore throat, when she woke up this morning she was feeling worse.  Her symptoms included a cough, head congestion, rhinorrhea and continued chills.  She took a COVID-19 test and it was positive.  She does have a history of COPD and a history of bladder cancer.  She is not experiencing any shortness of breath past baseline, wheezing, fevers.  She has had 5 COVID vaccinations but not the new booster.   ROS: See pertinent positives and negatives per HPI.  Past Medical History:  Diagnosis Date   Anxiety    aniety attacks   Bladder tumor    Colon polyps    COPD (chronic obstructive pulmonary disease)    09-08-2020 acute flare up with cough finished antibiotics and steroids cough resolved now   Depression    Hay fever    allergies   History of kidney stones    Migraines    not in 40 years   Wears glasses     Past Surgical History:  Procedure Laterality Date   BILIARY STENT PLACEMENT  07/25/2022   Procedure: BILIARY STENT PLACEMENT;  Surgeon: Jeani Hawking, MD;  Location: [facility] ENDOSCOPY;  Service: Gastroenterology;;   BREAST BIOPSY  1990   caesarean secion  1974, 1977, 1984   CESAREAN SECTION  [id number]   CHOLECYSTECTOMY N/A 07/17/2022   Procedure: LAPAROSCOPIC SUBTOTAL CHOLECYSTECTOMY;  Surgeon: Griselda Miner, MD;   Location: [facility] OR;  Service: General;  Laterality: N/A;   COLONOSCOPY W/ POLYPECTOMY  last done 2016    x 3    CYSTOSCOPY N/A 09/21/2020   Procedure: CYSTOSCOPY;  Surgeon: Noel Christmas, MD;  Location: [facility];  Service: Urology;  Laterality: N/A;   CYSTOSCOPY W/ RETROGRADES Bilateral 02/04/2020   Procedure: CYSTOSCOPY WITH RETROGRADE PYELOGRAM;  Surgeon: Crista Elliot, MD;  Location: [facility];  Service: Urology;  Laterality: Bilateral;   ERCP N/A 07/22/2022   Procedure: ENDOSCOPIC RETROGRADE CHOLANGIOPANCREATOGRAPHY (ERCP);  Surgeon: Vida Rigger, MD;  Location: [facility] ENDOSCOPY;  Service: Gastroenterology;  Laterality: N/A;   ERCP N/A 07/25/2022   Procedure: ENDOSCOPIC RETROGRADE CHOLANGIOPANCREATOGRAPHY (ERCP);  Surgeon: Jeani Hawking, MD;  Location: [facility] ENDOSCOPY;  Service: Gastroenterology;  Laterality: N/A;   FINGER FRACTURE SURGERY Right 07/11/2022   5th digit has a screw   OPEN REDUCTION INTERNAL FIXATION (ORIF) DISTAL RADIAL FRACTURE Right 11/03/2014   Procedure: OPEN REDUCTION INTERNAL FIXATION (ORIF) RIGHT DISTAL RADIAL FRACTURE WITH REPAIR AND RECONSTRUCTION;  Surgeon: Dominica Severin, MD;  Location: [facility] OR;  Service: Orthopedics;  Laterality: Right;   PANCREATIC STENT PLACEMENT  07/22/2022   Procedure: PANCREATIC STENT PLACEMENT;  Surgeon: Vida Rigger, MD;  Location: [facility] ENDOSCOPY;  Service: Gastroenterology;;   Dennison Mascot  07/22/2022   Procedure: Dennison Mascot;  Surgeon: Vida Rigger, MD;  Location: [facility] ENDOSCOPY;  Service: Gastroenterology;;   TRANSURETHRAL RESECTION OF BLADDER TUMOR WITH MITOMYCIN-C  N/A 02/04/2020   Procedure: TRANSURETHRAL RESECTION OF BLADDER TUMOR WITH POST OPERATIVE GEMCITABINE;  Surgeon: Crista Elliot, MD;  Location: [facility];  Service: Urology;  Laterality: N/A;   TRANSURETHRAL RESECTION OF BLADDER TUMOR WITH MITOMYCIN-C N/A 09/21/2020   Procedure: CYSTOSCOPY WITH BLADDER FULGARATION WITH POST OP  GEMCITABINE;  Surgeon: Noel Christmas, MD;  Location: [facility];  Service: Urology;  Laterality: N/A;  1 HR    Family History  Problem Relation Age of Onset   Arthritis Other    Stroke Other    Hypertension Other    Depression Other    Migraines Other    Colon polyps Other    Breast cancer Neg Hx        Current Outpatient Medications:    Ascorbic Acid (VITAMIN C) 1000 MG tablet, Take 1,000 mg by mouth daily., Disp: , Rfl:    Calcium Carbonate (CALCIUM 600 PO), Take 600 mg by mouth daily in the afternoon., Disp: , Rfl:    FLUoxetine (PROZAC) 20 MG capsule, TAKE 1 CAPSULE BY MOUTH EVERY DAY (Patient taking differently: Take 20 mg by mouth daily.), Disp: 90 capsule, Rfl: 3   guaiFENesin (MUCINEX) 600 MG 12 hr tablet, Take 1 tablet (600 mg total) by mouth 2 (two) times daily as needed for to loosen phlegm., Disp: , Rfl:    nirmatrelvir/ritonavir (PAXLOVID) 20 x 150 MG & 10 x 100MG  TABS, Take 3 tablets by mouth 2 (two) times daily for 5 days. (Take nirmatrelvir 150 mg two tablets twice daily for 5 days and ritonavir 100 mg one tablet twice daily for 5 days) Patient GFR is >60, Disp: 30 tablet, Rfl: 0   VITAMIN D PO, Take by mouth., Disp: , Rfl:   EXAM:  VITALS per patient if applicable:  GENERAL: alert, oriented, appears well and in no acute distress  HEENT: atraumatic, conjunttiva clear, no obvious abnormalities on inspection of external nose and ears  NECK: normal movements of the head and neck  LUNGS: on inspection no signs of respiratory distress, breathing rate appears normal, no obvious gross SOB, gasping or wheezing  CV: no obvious cyanosis  MS: moves all visible extremities without noticeable abnormality  PSYCH/NEURO: pleasant and cooperative, no obvious depression or anxiety, speech and thought processing grossly intact  ASSESSMENT AND PLAN:  Discussed the following assessment and plan: 1. COVID-19 - Due to PMH will treat with Paxlovid. We  discussed side effects.  - Follow up in office if symptoms worse or go to the ER - nirmatrelvir/ritonavir (PAXLOVID) 20 x 150 MG & 10 x 100MG  TABS; Take 3 tablets by mouth 2 (two) times daily for 5 days. (Take nirmatrelvir 150 mg two tablets twice daily for 5 days and ritonavir 100 mg one tablet twice daily for 5 days) Patient GFR is >60  Dispense: 30 tablet; Refill: 0      I discussed the assessment and treatment plan with the patient. The patient was provided an opportunity to ask questions and all were answered. The patient agreed with the plan and demonstrated an understanding of the instructions.   The patient was advised to call back or seek an in-person evaluation if the symptoms worsen or if the condition fails to improve as anticipated.   Shirline Frees, NP

## 2022-10-03 ENCOUNTER — Other Ambulatory Visit: Payer: Self-pay | Admitting: Gastroenterology

## 2022-10-14 ENCOUNTER — Other Ambulatory Visit: Payer: Self-pay | Admitting: Family Medicine

## 2022-10-30 ENCOUNTER — Ambulatory Visit: Payer: Medicare HMO | Admitting: Nurse Practitioner

## 2022-10-30 ENCOUNTER — Other Ambulatory Visit: Payer: Self-pay | Admitting: Nurse Practitioner

## 2022-10-30 ENCOUNTER — Other Ambulatory Visit (HOSPITAL_COMMUNITY): Payer: Self-pay

## 2022-10-30 ENCOUNTER — Ambulatory Visit (INDEPENDENT_AMBULATORY_CARE_PROVIDER_SITE_OTHER): Payer: Medicare HMO

## 2022-10-30 VITALS — BP 126/78 | HR 95 | Temp 98.4°F | Ht 67.0 in | Wt 160.0 lb

## 2022-10-30 DIAGNOSIS — J432 Centrilobular emphysema: Secondary | ICD-10-CM | POA: Diagnosis not present

## 2022-10-30 DIAGNOSIS — J440 Chronic obstructive pulmonary disease with acute lower respiratory infection: Secondary | ICD-10-CM | POA: Diagnosis not present

## 2022-10-30 DIAGNOSIS — J209 Acute bronchitis, unspecified: Secondary | ICD-10-CM

## 2022-10-30 DIAGNOSIS — J018 Other acute sinusitis: Secondary | ICD-10-CM | POA: Diagnosis not present

## 2022-10-30 DIAGNOSIS — J019 Acute sinusitis, unspecified: Secondary | ICD-10-CM | POA: Insufficient documentation

## 2022-10-30 DIAGNOSIS — R059 Cough, unspecified: Secondary | ICD-10-CM | POA: Diagnosis not present

## 2022-10-30 MED ORDER — PREDNISONE 10 MG PO TABS
ORAL_TABLET | ORAL | 0 refills | Status: DC
Start: 2022-10-30 — End: 2022-11-15

## 2022-10-30 MED ORDER — ALBUTEROL SULFATE HFA 108 (90 BASE) MCG/ACT IN AERS
2.0000 | INHALATION_SPRAY | Freq: Four times a day (QID) | RESPIRATORY_TRACT | 2 refills | Status: AC | PRN
Start: 2022-10-30 — End: ?

## 2022-10-30 MED ORDER — DOXYCYCLINE HYCLATE 100 MG PO TABS
100.0000 mg | ORAL_TABLET | Freq: Two times a day (BID) | ORAL | 0 refills | Status: AC
Start: 2022-10-30 — End: 2022-11-06

## 2022-10-30 MED ORDER — STIOLTO RESPIMAT 2.5-2.5 MCG/ACT IN AERS
2.0000 | INHALATION_SPRAY | Freq: Every day | RESPIRATORY_TRACT | 5 refills | Status: DC
Start: 2022-10-30 — End: 2022-10-30

## 2022-10-30 MED ORDER — BENZONATATE 200 MG PO CAPS
200.0000 mg | ORAL_CAPSULE | Freq: Three times a day (TID) | ORAL | 1 refills | Status: DC | PRN
Start: 2022-10-30 — End: 2022-11-15

## 2022-10-30 MED ORDER — FLUTICASONE PROPIONATE 50 MCG/ACT NA SUSP: 1.0000 | Freq: Every day | NASAL | 2 refills | Status: DC

## 2022-10-30 NOTE — Assessment & Plan Note (Addendum)
Recent COVID illness, now with acute bronchitis and suspected AECOPD. CXR today to rule out superimposed infection. Non-toxic and VS stable on room air. We will treat her with empiric doxycycline and prednisone taper. Depo inj 80 mg x 1 administered in clinic. This is her second exacerbation this year. Recommended starting her on maintenance therapy with Stiolto. Low eosinophil count on previous testing so would hold off on ICS. Medication education provided. Provided with SABA PRN for rescue and educated on proper use. Asthma action plan in place. Cough control measures.   Patient Instructions  -Albuterol inhaler 2 puffs every 6 hours as needed for shortness of breath or wheezing. Notify if symptoms persist despite rescue inhaler/neb use.  -Stiolto 2 puffs daily. This is your new maintenance inhaler you will use this regardless of how you are feeling. If your breathing is doing well, stop using this a week prior to your pulmonary function testing  -Continue mucinex DM 1 tab Twice daily for cough/congesiton -Continue hydromet cough syrup 5 mL every 6 hours as needed for cough. Do not drive after taking. May cause drowsiness    -Doxycycline 1 tab Twice daily for 7 days. Take in AM with food. -Prednisone taper. 4 tabs for 2 days, then 3 tabs for 2 days, 2 tabs for 2 days, then 1 tab for 2 days, then stop. Take in AM with food. Start tomorrow  -Benzonatate 1 capsule Three times a day for cough. Use consistently over the next few days to calm your cough down then as needed -Flonase nasal spray 2 sprays each nostril daily for nasal congestion/drainage   Chest x ray today  Follow up in 2 weeks with Dr. Francine Graven or Philis Nettle. If symptoms do not improve or worsen, please contact office for sooner follow up or seek emergency care.

## 2022-10-30 NOTE — Patient Instructions (Signed)
-  Albuterol inhaler 2 puffs every 6 hours as needed for shortness of breath or wheezing. Notify if symptoms persist despite rescue inhaler/neb use.  -Stiolto 2 puffs daily. This is your new maintenance inhaler you will use this regardless of how you are feeling. If your breathing is doing well, stop using this a week prior to your pulmonary function testing  -Continue mucinex DM 1 tab Twice daily for cough/congesiton -Continue hydromet cough syrup 5 mL every 6 hours as needed for cough. Do not drive after taking. May cause drowsiness    -Doxycycline 1 tab Twice daily for 7 days. Take in AM with food. -Prednisone taper. 4 tabs for 2 days, then 3 tabs for 2 days, 2 tabs for 2 days, then 1 tab for 2 days, then stop. Take in AM with food. Start tomorrow  -Benzonatate 1 capsule Three times a day for cough. Use consistently over the next few days to calm your cough down then as needed -Flonase nasal spray 2 sprays each nostril daily for nasal congestion/drainage   Chest x ray today  Follow up in 2 weeks with Dr. Francine Graven or Philis Nettle. If symptoms do not improve or worsen, please contact office for sooner follow up or seek emergency care.

## 2022-10-30 NOTE — Assessment & Plan Note (Signed)
Persistent sinus symptoms following COVID infection. Possible bacterial coinfection. Empiric doxy. Start intranasal steroid.

## 2022-10-30 NOTE — Telephone Encounter (Signed)
Per request, preferred alternatives are Anoro and Bevespi. Test claims show both have a $47.00 co-pay at this time.

## 2022-10-30 NOTE — Progress Notes (Unsigned)
@Patient  ID: [name], female    DOB: Jun 09, 1946, 77 y.o.   MRN: [id number]  Chief Complaint  Patient presents with   Acute Visit    Pt had covid 4/11. Pt still has cough, sputum thick (yellow)     Referring provider: Kristian Covey, MD  HPI: 77 year old female, former smoker followed for emphysema. She is a patient of Dr. Lanora Manis and last seen in office 08/14/2022. Past medical history significant for allergic rhinitis, osteoporosis, hx of bladder cancer, HLD, depression.  TEST/EVENTS:  07/19/2022 CTA chest: no evidence of PE. Atherosclerosis. Decompressed esophagus. Moderate emphysema. Bandlike atelectasis within the lingula, RML and both lower lobes. Previous lung nodules obscured due to motion.   08/14/2022: OV with Dr. Francine Graven for pulmonary consult. Referred for COPD exacerbation. Admitted 1/27-2/8 for acute cholecystitis and developed issues with her breathing - treated for AECOPD. Reports every fall she gets sick with cough, wheezing, SOB. Usually lasts about 3 weeks. Not on inhalers in the past. Former smoker with 75 pack year history. Recovered well since recent admission. No acute issues with her breathing at this time. Preferred to hold off on maintenance therapy until PFTs. F/u 4 months with PFTs.  10/30/2022: Today - acute Patient presents today for acute visit. She had COVID around 4/11. She was treated with Paxlovid. She had fevers and chills during her initial illness as well as some URI symptoms and occasional dry cough. She felt like she was recovering but then over the past week has started to feel worse. She has a productive cough with yellow phlegm and is having a lot of chest congestion. She's feeling very fatigued and appetite is not as good as it normally is. She's more short of breath during the day, especially with longer distances. She's also wheezing at night, which she normally doesn't do. She does still have some sinus congestion and occasionally feels like  she has drainage down the back of her throat. She denies any recurrent fevers, chills, hemoptysis, leg swelling, orthopnea. She does not have a rescue inhaler or maintenance. Taking cough syrup she had previously been prescribed and mucinex over the counter.   Allergies  Allergen Reactions   Aspirin Other (See Comments)     vasculitis    Immunization History  Administered Date(s) Administered   Fluad Quad(high Dose 65+) 01/26/2022   Hepatitis A, Adult 05/03/2015   Influenza Split 03/14/2011, 04/29/2012, 02/20/2013   Influenza Whole 03/19/2010   Influenza, High Dose Seasonal PF 03/26/2014, 03/11/2017, 02/04/2018, 02/11/2019   Influenza-Unspecified 03/16/2015, 03/06/2016, 03/11/2017, 02/06/2020, 03/14/2021, 01/26/2022   PFIZER(Purple Top)SARS-COV-2 Vaccination 07/14/2019, 08/04/2019, 02/16/2020   PNEUMOCOCCAL CONJUGATE-20 06/16/2021   Pfizer Covid-19 Vaccine Bivalent Booster 4yrs & up 01/13/2021, 06/16/2021   Pneumococcal Conjugate-13 01/02/2017, 09/01/2019   Pneumococcal Polysaccharide-23 11/03/2009   Rsv, Bivalent, Protein Subunit Rsvpref,pf Verdis Frederickson) 03/24/2022   Td 06/19/2006, 08/27/2007   Tdap 06/06/2019   Unspecified SARS-COV-2 Vaccination 03/24/2022   Zoster Recombinat (Shingrix) 03/11/2017, 06/30/2017    Past Medical History:  Diagnosis Date   Anxiety    aniety attacks   Bladder tumor    Colon polyps    COPD (chronic obstructive pulmonary disease) (HCC)    09-08-2020 acute flare up with cough finished antibiotics and steroids cough resolved now   Depression    Hay fever    allergies   History of kidney stones    Migraines    not in 40 years   Wears glasses     Tobacco History: Social History  Tobacco Use  Smoking Status Former   Packs/day: 1.50   Years: 50.00   Additional pack years: 0.00   Total pack years: 75.00   Types: Cigarettes   Quit date: 2012   Years since quitting: 12.3  Smokeless Tobacco Never  Tobacco Comments   Former smoker    Counseling given: Not Answered Tobacco comments: Former smoker   Outpatient Medications Prior to Visit  Medication Sig Dispense Refill   Ascorbic Acid (VITAMIN C) 1000 MG tablet Take 1,000 mg by mouth daily.     Calcium Carbonate (CALCIUM 600 PO) Take 600 mg by mouth daily in the afternoon.     FLUoxetine (PROZAC) 20 MG capsule TAKE 1 CAPSULE BY MOUTH EVERY DAY 90 capsule 0   guaiFENesin (MUCINEX) 600 MG 12 hr tablet Take 1 tablet (600 mg total) by mouth 2 (two) times daily as needed for to loosen phlegm.     VITAMIN D PO Take by mouth.     No facility-administered medications prior to visit.     Review of Systems:   Constitutional: No weight loss or gain, night sweats, fevers, chills, or lassitude. +fatigue  HEENT: No headaches, difficulty swallowing, tooth/dental problems, or sore throat. No sneezing, itching, ear ache. +nasal congestion, post nasal drip CV:  No chest pain, orthopnea, PND, swelling in lower extremities, anasarca, dizziness, palpitations, syncope Resp: +shortness of breath with exertion; productive cough; wheeze. No excess mucus or change in color of mucus. No productive or non-productive. No hemoptysis. No wheezing.  No chest wall deformity GI:  +decreased appetite. No heartburn, indigestion, abdominal pain, nausea, vomiting, diarrhea, change in bowel habits, bloody stools.  GU: No dysuria, change in color of urine, urgency or frequency.  No flank pain, no hematuria  Skin: No rash, lesions, ulcerations MSK:  No joint pain or swelling.   Neuro: No dizziness or lightheadedness.  Psych: No depression or anxiety. Mood stable.     Physical Exam:  BP 126/78   Pulse 95   Temp 98.4 F (36.9 C) (Oral)   Ht 5\' 7"  (1.702 m)   Wt 160 lb (72.6 kg)   SpO2 94%   BMI 25.06 kg/m   GEN: Pleasant, interactive, well-kempt; in no acute distress. HEENT:  Normocephalic and atraumatic. PERRLA. Sclera white. Nasal turbinates pink, moist and patent bilaterally. Clear  rhinorrhea present. Maxillary sinus tenderness. Oropharynx pink and moist, without exudate or edema. No lesions, ulcerations, or postnasal drip.  NECK:  Supple w/ fair ROM. No JVD present. Normal carotid impulses w/o bruits. Thyroid symmetrical with no goiter or nodules palpated. No lymphadenopathy.   CV: RRR, no m/r/g, no peripheral edema. Pulses intact, +2 bilaterally. No cyanosis, pallor or clubbing. PULMONARY:  Unlabored, regular breathing. Scattered rhonchi and expiratory wheezes bilaterally A&P. Bronchitic cough. No accessory muscle use.  GI: BS present and normoactive. Soft, non-tender to palpation. No organomegaly or masses detected.  MSK: No erythema, warmth or tenderness. Cap refil <2 sec all extrem. No deformities or joint swelling noted.  Neuro: A/Ox3. No focal deficits noted.   Skin: Warm, no lesions or rashe Psych: Normal affect and behavior. Judgement and thought content appropriate.     Lab Results:  CBC    Component Value Date/Time   WBC 12.0 (H) 07/27/2022 0342   RBC 3.82 (L) 07/27/2022 0342   HGB 11.6 (L) 07/27/2022 0342   HCT 35.7 (L) 07/27/2022 0342   PLT 352 07/27/2022 0342   MCV 93.5 07/27/2022 0342   MCH 30.4 07/27/2022 0342   MCHC  32.5 07/27/2022 0342   RDW 13.9 07/27/2022 0342   LYMPHSABS 1.1 07/15/2022 1708   MONOABS 1.6 (H) 07/15/2022 1708   EOSABS 0.0 07/15/2022 1708   BASOSABS 0.0 07/15/2022 1708    BMET    Component Value Date/Time   NA 140 07/26/2022 0755   K 3.5 07/26/2022 0755   CL 102 07/26/2022 0755   CO2 26 07/26/2022 0755   GLUCOSE 116 (H) 07/26/2022 0755   BUN 8 07/26/2022 0755   CREATININE 0.74 07/26/2022 0755   CALCIUM 8.6 (L) 07/26/2022 0755   GFRNONAA >60 07/26/2022 0755   GFRAA  02/09/2010 1100    >60        The eGFR has been calculated using the MDRD equation. This calculation has not been validated in all clinical situations. eGFR's persistently <60 mL/min signify possible Chronic Kidney Disease.    BNP No results  found for: "BNP"   Imaging:  DG Chest 2 View  Result Date: 10/30/2022 CLINICAL DATA:  Corrective cough post COVID infection. EXAM: CHEST - 2 VIEW COMPARISON:  07/19/2022 FINDINGS: The heart size and mediastinal contours are within normal limits. Diffuse pattern of perihilar bronchial thickening, more affecting the lower lung zones and slightly more prominent on the right compared to the left. No dense airspace consolidation, pulmonary edema, pleural fluid or pneumothorax identified. The visualized skeletal structures are unremarkable. IMPRESSION: Diffuse pattern of perihilar bronchial thickening, more prominent on the right compared to the left. Findings may reflect residual/acute bronchitis. Electronically Signed   By: Irish Lack M.D.   On: 10/30/2022 10:01          No data to display          No results found for: "NITRICOXIDE"      Assessment & Plan:   Acute bronchitis Recent COVID illness, now with acute bronchitis and suspected AECOPD. CXR today to rule out superimposed infection. Non-toxic and VS stable on room air. We will treat her with empiric doxycycline and prednisone taper. Depo inj 80 mg x 1 administered in clinic. This is her second exacerbation this year. Recommended starting her on maintenance therapy with Stiolto. Low eosinophil count on previous testing so would hold off on ICS. Medication education provided. Provided with SABA PRN for rescue and educated on proper use. Asthma action plan in place. Cough control measures.   Patient Instructions  -Albuterol inhaler 2 puffs every 6 hours as needed for shortness of breath or wheezing. Notify if symptoms persist despite rescue inhaler/neb use.  -Stiolto 2 puffs daily. This is your new maintenance inhaler you will use this regardless of how you are feeling. If your breathing is doing well, stop using this a week prior to your pulmonary function testing  -Continue mucinex DM 1 tab Twice daily for  cough/congesiton -Continue hydromet cough syrup 5 mL every 6 hours as needed for cough. Do not drive after taking. May cause drowsiness    -Doxycycline 1 tab Twice daily for 7 days. Take in AM with food. -Prednisone taper. 4 tabs for 2 days, then 3 tabs for 2 days, 2 tabs for 2 days, then 1 tab for 2 days, then stop. Take in AM with food. Start tomorrow  -Benzonatate 1 capsule Three times a day for cough. Use consistently over the next few days to calm your cough down then as needed -Flonase nasal spray 2 sprays each nostril daily for nasal congestion/drainage   Chest x ray today  Follow up in 2 weeks with Dr. Francine Graven or Florentina Addison  Cecile Guevara,NP. If symptoms do not improve or worsen, please contact office for sooner follow up or seek emergency care.    Acute sinusitis Persistent sinus symptoms following COVID infection. Possible bacterial coinfection. Empiric doxy. Start intranasal steroid.   I spent 42 minutes of dedicated to the care of this patient on the date of this encounter to include pre-visit review of records, face-to-face time with the patient discussing conditions above, post visit ordering of testing, clinical documentation with the electronic health record, making appropriate referrals as documented, and communicating necessary findings to members of the patients care team.  Noemi Chapel, NP 10/30/2022  Pt aware and understands NP's role.

## 2022-11-01 ENCOUNTER — Encounter: Payer: Self-pay | Admitting: Nurse Practitioner

## 2022-11-01 DIAGNOSIS — C678 Malignant neoplasm of overlapping sites of bladder: Secondary | ICD-10-CM | POA: Diagnosis not present

## 2022-11-01 DIAGNOSIS — N200 Calculus of kidney: Secondary | ICD-10-CM | POA: Diagnosis not present

## 2022-11-16 ENCOUNTER — Ambulatory Visit: Payer: Medicare HMO | Admitting: Nurse Practitioner

## 2022-11-16 ENCOUNTER — Encounter: Payer: Self-pay | Admitting: Nurse Practitioner

## 2022-11-16 VITALS — BP 102/68 | HR 78 | Temp 98.0°F | Ht 67.0 in | Wt 161.0 lb

## 2022-11-16 DIAGNOSIS — J439 Emphysema, unspecified: Secondary | ICD-10-CM

## 2022-11-16 DIAGNOSIS — J209 Acute bronchitis, unspecified: Secondary | ICD-10-CM | POA: Diagnosis not present

## 2022-11-16 DIAGNOSIS — J432 Centrilobular emphysema: Secondary | ICD-10-CM | POA: Insufficient documentation

## 2022-11-16 DIAGNOSIS — U099 Post covid-19 condition, unspecified: Secondary | ICD-10-CM | POA: Diagnosis not present

## 2022-11-16 DIAGNOSIS — J40 Bronchitis, not specified as acute or chronic: Secondary | ICD-10-CM | POA: Diagnosis not present

## 2022-11-16 MED ORDER — AZITHROMYCIN 250 MG PO TABS
ORAL_TABLET | ORAL | 0 refills | Status: DC
Start: 2022-11-16 — End: 2023-01-01

## 2022-11-16 MED ORDER — ALBUTEROL SULFATE (2.5 MG/3ML) 0.083% IN NEBU
2.5000 mg | INHALATION_SOLUTION | Freq: Four times a day (QID) | RESPIRATORY_TRACT | 12 refills | Status: AC | PRN
Start: 2022-11-16 — End: ?

## 2022-11-16 MED ORDER — PREDNISONE 10 MG PO TABS
ORAL_TABLET | ORAL | 0 refills | Status: DC
Start: 2022-11-16 — End: 2023-01-01

## 2022-11-16 NOTE — Assessment & Plan Note (Signed)
No formal PFTs. Treated for acute bronchitis/emphysema flare. Awaiting PFTs. She is now on maintenance therapy with Anoro. Action plan in place.

## 2022-11-16 NOTE — Progress Notes (Signed)
@Patient  ID: [name], female    DOB: 07-Dec-1945, 77 y.o.   MRN: [id number]  Chief Complaint  Patient presents with   Follow-up    Pt states she is feeling better, she is still having problems with coughing and getting the secretion up.     Referring provider: Kristian Covey, MD  HPI: 77 year old female, former smoker followed for emphysema. She is a patient of Dr. Lanora Manis and last seen in office 10/30/2022 by [facility] NP. Past medical history significant for allergic rhinitis, osteoporosis, hx of bladder cancer, HLD, depression.  TEST/EVENTS:  07/19/2022 CTA chest: no evidence of PE. Atherosclerosis. Decompressed esophagus. Moderate emphysema. Bandlike atelectasis within the lingula, RML and both lower lobes. Previous lung nodules obscured due to motion.  10/30/2022 CXR: diffuse pattern of perihilar bronchial thickening, more affecting the lower lung zones and slightly more prominent on the right compared to the left.   08/14/2022: OV with Dr. Francine Graven for pulmonary consult. Referred for COPD exacerbation. Admitted 1/27-2/8 for acute cholecystitis and developed issues with her breathing - treated for AECOPD. Reports every fall she gets sick with cough, wheezing, SOB. Usually lasts about 3 weeks. Not on inhalers in the past. Former smoker with 75 pack year history. Recovered well since recent admission. No acute issues with her breathing at this time. Preferred to hold off on maintenance therapy until PFTs. F/u 4 months with PFTs.  10/30/2022: OV with Staceyann Knouff NP for acute visit. She had COVID around 4/11. She was treated with Paxlovid. She had fevers and chills during her initial illness as well as some URI symptoms and occasional dry cough. She felt like she was recovering but then over the past week has started to feel worse. She has a productive cough with yellow phlegm and is having a lot of chest congestion. She's feeling very fatigued and appetite is not as good as it normally is. She's  more short of breath during the day, especially with longer distances. She's also wheezing at night, which she normally doesn't do. She does still have some sinus congestion and occasionally feels like she has drainage down the back of her throat. She denies any recurrent fevers, chills, hemoptysis, leg swelling, orthopnea. She does not have a rescue inhaler or maintenance. Taking cough syrup she had previously been prescribed and mucinex over the counter.   11/16/2022: Today - follow up Patient presents today for follow up. She is feeling much better compared to her last visit but she does feel like since she came off the steroids and abx, that her cough has started to pick back up again. It can be barky at times. She occasionally produces some white phlegm but sometimes feels like it gets stuck. Her breathing has improved, feels closer to her baseline but still a little more short than normal. She's still noticing wheezing at night, which restarted after she completed steroids. Sinus symptoms have resolved. Denies any fevers, chills, hemoptysis, leg swelling, orthopnea. She has not had to use the rescue inhaler. Insurance wouldn't cover Stiolto so she was prescribed Anoro, which she feels like is helping.   Allergies  Allergen Reactions   Aspirin Other (See Comments)     vasculitis    Immunization History  Administered Date(s) Administered   Fluad Quad(high Dose 65+) 01/26/2022   Hepatitis A, Adult 05/03/2015   Influenza Split 03/14/2011, 04/29/2012, 02/20/2013   Influenza Whole 03/19/2010   Influenza, High Dose Seasonal PF 03/26/2014, 03/11/2017, 02/04/2018, 02/11/2019   Influenza-Unspecified 03/16/2015, 03/06/2016,  03/11/2017, 02/06/2020, 03/14/2021, 01/26/2022   PFIZER(Purple Top)SARS-COV-2 Vaccination 07/14/2019, 08/04/2019, 02/16/2020   PNEUMOCOCCAL CONJUGATE-20 06/16/2021   Pfizer Covid-19 Vaccine Bivalent Booster 66yrs & up 01/13/2021, 06/16/2021   Pneumococcal Conjugate-13 01/02/2017,  09/01/2019   Pneumococcal Polysaccharide-23 11/03/2009   Rsv, Bivalent, Protein Subunit Rsvpref,pf Verdis Frederickson) 03/24/2022   Td 06/19/2006, 08/27/2007   Tdap 06/06/2019   Unspecified SARS-COV-2 Vaccination 03/24/2022   Zoster Recombinat (Shingrix) 03/11/2017, 06/30/2017    Past Medical History:  Diagnosis Date   Anxiety    aniety attacks   Bladder tumor    Colon polyps    COPD (chronic obstructive pulmonary disease) (HCC)    09-08-2020 acute flare up with cough finished antibiotics and steroids cough resolved now   Depression    Hay fever    allergies   History of kidney stones    Migraines    not in 40 years   Wears glasses     Tobacco History: Social History   Tobacco Use  Smoking Status Former   Packs/day: 1.50   Years: 50.00   Additional pack years: 0.00   Total pack years: 75.00   Types: Cigarettes   Quit date: 2012   Years since quitting: 12.4  Smokeless Tobacco Never  Tobacco Comments   Former smoker   Counseling given: Not Answered Tobacco comments: Former smoker   Outpatient Medications Prior to Visit  Medication Sig Dispense Refill   albuterol (VENTOLIN HFA) 108 (90 Base) MCG/ACT inhaler Inhale 2 puffs into the lungs every 6 (six) hours as needed for wheezing or shortness of breath. 8 g 2   Ascorbic Acid (VITAMIN C) 1000 MG tablet Take 1,000 mg by mouth daily.     Calcium Carbonate (CALCIUM 600 PO) Take 600 mg by mouth daily in the afternoon.     FLUoxetine (PROZAC) 20 MG capsule TAKE 1 CAPSULE BY MOUTH EVERY DAY 90 capsule 0   fluticasone (FLONASE) 50 MCG/ACT nasal spray Place 1 spray into both nostrils daily. 18.2 mL 2   guaiFENesin (MUCINEX) 600 MG 12 hr tablet Take 1 tablet (600 mg total) by mouth 2 (two) times daily as needed for to loosen phlegm.     umeclidinium-vilanterol (ANORO ELLIPTA) 62.5-25 MCG/ACT AEPB Inhale 1 puff into the lungs daily. 60 each 5   VITAMIN D PO Take by mouth.     benzonatate (TESSALON) 200 MG capsule Take 1 capsule (200 mg  total) by mouth 3 (three) times daily as needed for cough. 30 capsule 1   predniSONE (DELTASONE) 10 MG tablet 4 tabs for 2 days, then 3 tabs for 2 days, 2 tabs for 2 days, then 1 tab for 2 days, then stop 20 tablet 0   No facility-administered medications prior to visit.     Review of Systems:   Constitutional: No weight loss or gain, night sweats, fevers, chills, or lassitude. +fatigue (improved) HEENT: No headaches, difficulty swallowing, tooth/dental problems, or sore throat. No sneezing, itching, ear ache, nasal congestion, post nasal drip CV:  No chest pain, orthopnea, PND, swelling in lower extremities, anasarca, dizziness, palpitations, syncope Resp: +shortness of breath with exertion; productive cough; nocturnal wheeze. No hemoptysis. No chest wall deformity GI:  No heartburn, indigestion, abdominal pain, nausea, vomiting, diarrhea, change in bowel habits, bloody stools, loss of appetite.  GU: No dysuria, change in color of urine, urgency or frequency. Skin: No rash, lesions, ulcerations MSK:  No joint pain or swelling.   Neuro: No dizziness or lightheadedness.  Psych: No depression or anxiety. Mood stable.  Physical Exam:  BP 102/68   Pulse 78   Temp 98 F (36.7 C) (Oral)   Ht 5\' 7"  (1.702 m)   Wt 161 lb (73 kg)   SpO2 95%   BMI 25.22 kg/m   GEN: Pleasant, interactive, well-kempt; in no acute distress. HEENT:  Normocephalic and atraumatic. PERRLA. Sclera white. Nasal turbinates pink, moist and patent bilaterally. Clear rhinorrhea present. Maxillary sinus tenderness. Oropharynx pink and moist, without exudate or edema. No lesions, ulcerations, or postnasal drip.  NECK:  Supple w/ fair ROM. No JVD present. Normal carotid impulses w/o bruits. Thyroid symmetrical with no goiter or nodules palpated. No lymphadenopathy.   CV: RRR, no m/r/g, no peripheral edema. Pulses intact, +2 bilaterally. No cyanosis, pallor or clubbing. PULMONARY:  Unlabored, regular breathing. Minimal  scattered rhonchi. No accessory muscle use.  GI: BS present and normoactive. Soft, non-tender to palpation. No organomegaly or masses detected.  MSK: No erythema, warmth or tenderness. Cap refil <2 sec all extrem. No deformities or joint swelling noted.  Neuro: A/Ox3. No focal deficits noted.   Skin: Warm, no lesions or rashe Psych: Normal affect and behavior. Judgement and thought content appropriate.     Lab Results:  CBC    Component Value Date/Time   WBC 12.0 (H) 07/27/2022 0342   RBC 3.82 (L) 07/27/2022 0342   HGB 11.6 (L) 07/27/2022 0342   HCT 35.7 (L) 07/27/2022 0342   PLT 352 07/27/2022 0342   MCV 93.5 07/27/2022 0342   MCH 30.4 07/27/2022 0342   MCHC 32.5 07/27/2022 0342   RDW 13.9 07/27/2022 0342   LYMPHSABS 1.1 07/15/2022 1708   MONOABS 1.6 (H) 07/15/2022 1708   EOSABS 0.0 07/15/2022 1708   BASOSABS 0.0 07/15/2022 1708    BMET    Component Value Date/Time   NA 140 07/26/2022 0755   K 3.5 07/26/2022 0755   CL 102 07/26/2022 0755   CO2 26 07/26/2022 0755   GLUCOSE 116 (H) 07/26/2022 0755   BUN 8 07/26/2022 0755   CREATININE 0.74 07/26/2022 0755   CALCIUM 8.6 (L) 07/26/2022 0755   GFRNONAA >60 07/26/2022 0755   GFRAA  02/09/2010 1100    >60        The eGFR has been calculated using the MDRD equation. This calculation has not been validated in all clinical situations. eGFR's persistently <60 mL/min signify possible Chronic Kidney Disease.    BNP No results found for: "BNP"   Imaging:  DG Chest 2 View  Result Date: 10/30/2022 CLINICAL DATA:  Corrective cough post COVID infection. EXAM: CHEST - 2 VIEW COMPARISON:  07/19/2022 FINDINGS: The heart size and mediastinal contours are within normal limits. Diffuse pattern of perihilar bronchial thickening, more affecting the lower lung zones and slightly more prominent on the right compared to the left. No dense airspace consolidation, pulmonary edema, pleural fluid or pneumothorax identified. The visualized  skeletal structures are unremarkable. IMPRESSION: Diffuse pattern of perihilar bronchial thickening, more prominent on the right compared to the left. Findings may reflect residual/acute bronchitis. Electronically Signed   By: Irish Lack M.D.   On: 10/30/2022 10:01          No data to display          No results found for: "NITRICOXIDE"      Assessment & Plan:   Acute bronchitis Clinically improved but now with recurrent symptoms after completing steroids/abx. We will cover her for any atypical pathogens with empiric azithromycin. Extend prednisone taper. Continue cough control measures. We  will reassess at follow up.   Patient Instructions  -Continue Albuterol inhaler 2 puffs or 3 mL every 6 hours as needed for shortness of breath or wheezing. Notify if symptoms persist despite rescue inhaler/neb use.  -Continue Anoro 1 puffs daily. This is your new maintenance inhaler you will use this regardless of how you are feeling. If your breathing is doing well, stop using this a week prior to your pulmonary function testing  -Continue mucinex DM 1 tab Twice daily for cough/congesiton -Continue hydromet cough syrup 5 mL every 6 hours as needed for cough. Do not drive after taking. May cause drowsiness   -Continue Benzonatate 1 capsule Three times a day for cough. Use consistently over the next few days to calm your cough down then as needed -Continue Flonase nasal spray 2 sprays each nostril daily for nasal congestion/drainage   -Prednisone taper. 4 tabs for 3 days, then 3 tabs for 3 days, 2 tabs for 3 days, then 1 tab for 3 days, then stop. Take in AM with food  -Azithromycin - take 2 tabs on day one then 1 tab daily for four days. Take with food    Follow up in 4 weeks with Dr. Francine Graven or Philis Nettle. If symptoms do not improve or worsen, please contact office for sooner follow up or seek emergency care.    Acute exacerbation of emphysema (HCC) No formal PFTs. Treated for acute  bronchitis/emphysema flare. Awaiting PFTs. She is now on maintenance therapy with Anoro. Action plan in place.   I spent 35 minutes of dedicated to the care of this patient on the date of this encounter to include pre-visit review of records, face-to-face time with the patient discussing conditions above, post visit ordering of testing, clinical documentation with the electronic health record, making appropriate referrals as documented, and communicating necessary findings to members of the patients care team.  Noemi Chapel, NP 11/16/2022  Pt aware and understands NP's role.

## 2022-11-16 NOTE — Assessment & Plan Note (Signed)
Clinically improved but now with recurrent symptoms after completing steroids/abx. We will cover her for any atypical pathogens with empiric azithromycin. Extend prednisone taper. Continue cough control measures. We will reassess at follow up.   Patient Instructions  -Continue Albuterol inhaler 2 puffs or 3 mL every 6 hours as needed for shortness of breath or wheezing. Notify if symptoms persist despite rescue inhaler/neb use.  -Continue Anoro 1 puffs daily. This is your new maintenance inhaler you will use this regardless of how you are feeling. If your breathing is doing well, stop using this a week prior to your pulmonary function testing  -Continue mucinex DM 1 tab Twice daily for cough/congesiton -Continue hydromet cough syrup 5 mL every 6 hours as needed for cough. Do not drive after taking. May cause drowsiness   -Continue Benzonatate 1 capsule Three times a day for cough. Use consistently over the next few days to calm your cough down then as needed -Continue Flonase nasal spray 2 sprays each nostril daily for nasal congestion/drainage   -Prednisone taper. 4 tabs for 3 days, then 3 tabs for 3 days, 2 tabs for 3 days, then 1 tab for 3 days, then stop. Take in AM with food  -Azithromycin - take 2 tabs on day one then 1 tab daily for four days. Take with food    Follow up in 4 weeks with Dr. Francine Graven or Philis Nettle. If symptoms do not improve or worsen, please contact office for sooner follow up or seek emergency care.

## 2022-11-16 NOTE — Patient Instructions (Addendum)
-  Continue Albuterol inhaler 2 puffs or 3 mL every 6 hours as needed for shortness of breath or wheezing. Notify if symptoms persist despite rescue inhaler/neb use.  -Continue Anoro 1 puffs daily. This is your new maintenance inhaler you will use this regardless of how you are feeling. If your breathing is doing well, stop using this a week prior to your pulmonary function testing  -Continue mucinex DM 1 tab Twice daily for cough/congesiton -Continue hydromet cough syrup 5 mL every 6 hours as needed for cough. Do not drive after taking. May cause drowsiness   -Continue Benzonatate 1 capsule Three times a day for cough. Use consistently over the next few days to calm your cough down then as needed -Continue Flonase nasal spray 2 sprays each nostril daily for nasal congestion/drainage   -Prednisone taper. 4 tabs for 3 days, then 3 tabs for 3 days, 2 tabs for 3 days, then 1 tab for 3 days, then stop. Take in AM with food  -Azithromycin - take 2 tabs on day one then 1 tab daily for four days. Take with food    Follow up in 4 weeks with Dr. Francine Graven or Philis Nettle. If symptoms do not improve or worsen, please contact office for sooner follow up or seek emergency care.

## 2022-11-28 ENCOUNTER — Telehealth: Payer: Self-pay | Admitting: Pulmonary Disease

## 2022-11-28 NOTE — Telephone Encounter (Signed)
Pt called in bc she wants to let us know katie informed her to come back before her cruise. The first avail was July 15 th and she wants to make sure that is okay

## 2022-12-01 NOTE — Telephone Encounter (Signed)
[name] is follow-up on July 15th okay? Or do you want me to try and find a sooner appointment

## 2022-12-01 NOTE — Telephone Encounter (Signed)
Spoke with patient. Advised per Florentina Addison it okay for her to keep fu on 7/15. States she is feeling okay. Closing encounter. Nfn

## 2022-12-01 NOTE — Telephone Encounter (Signed)
If she's feeling better, I don't have to see her before her trip so that's fine. Thanks!

## 2022-12-17 DIAGNOSIS — U099 Post covid-19 condition, unspecified: Secondary | ICD-10-CM | POA: Diagnosis not present

## 2022-12-17 DIAGNOSIS — J40 Bronchitis, not specified as acute or chronic: Secondary | ICD-10-CM | POA: Diagnosis not present

## 2023-01-01 ENCOUNTER — Ambulatory Visit: Payer: Medicare HMO | Admitting: Nurse Practitioner

## 2023-01-01 ENCOUNTER — Ambulatory Visit: Payer: Medicare HMO

## 2023-01-01 ENCOUNTER — Encounter: Payer: Self-pay | Admitting: Nurse Practitioner

## 2023-01-01 VITALS — BP 118/70 | HR 78 | Ht 67.0 in | Wt 165.0 lb

## 2023-01-01 DIAGNOSIS — J432 Centrilobular emphysema: Secondary | ICD-10-CM | POA: Diagnosis not present

## 2023-01-01 DIAGNOSIS — J440 Chronic obstructive pulmonary disease with acute lower respiratory infection: Secondary | ICD-10-CM

## 2023-01-01 DIAGNOSIS — J40 Bronchitis, not specified as acute or chronic: Secondary | ICD-10-CM | POA: Diagnosis not present

## 2023-01-01 DIAGNOSIS — J209 Acute bronchitis, unspecified: Secondary | ICD-10-CM

## 2023-01-01 LAB — NITRIC OXIDE: Nitric Oxide: 26

## 2023-01-01 MED ORDER — TRELEGY ELLIPTA 100-62.5-25 MCG/ACT IN AEPB: 1.0000 | INHALATION_SPRAY | Freq: Every day | RESPIRATORY_TRACT | 5 refills | Status: DC

## 2023-01-01 MED ORDER — TRELEGY ELLIPTA 100-62.5-25 MCG/ACT IN AEPB: 1.0000 | INHALATION_SPRAY | Freq: Every day | RESPIRATORY_TRACT | Status: DC

## 2023-01-01 MED ORDER — METHYLPREDNISOLONE ACETATE 80 MG/ML IJ SUSP
80.0000 mg | Freq: Once | INTRAMUSCULAR | Status: AC
Start: 2023-01-01 — End: 2023-01-01
  Administered 2023-01-01: 80 mg via INTRAMUSCULAR

## 2023-01-01 NOTE — Assessment & Plan Note (Addendum)
Slow to resolve with elevated exhaled nitric oxide. Will treat her with depo 80 inj x 1 and add on ICS to current inhaler regimen. She will continue cough control and mucociliary clearance regimens. CXR today to rule out superimposed infection. Action plan in place. If no improvement, will need CT chest imaging for further evaluation.   Patient Instructions  -Continue Albuterol inhaler 2 puffs or 3 mL every 6 hours as needed for shortness of breath or wheezing. Notify if symptoms persist despite rescue inhaler/neb use.  -Restart mucinex 1 tab Twice daily for cough/congesiton -Continue hydromet cough syrup 5 mL every 6 hours as needed for cough. Do not drive after taking. May cause drowsiness   -Continue Benzonatate 1 capsule Three times a day for cough. Use consistently over the next few days to calm your cough down then as needed -Continue Flonase nasal spray 2 sprays each nostril daily for nasal congestion/drainage   Stop Anoro. Start Trelegy 1 puff daily. Brush tongue and rinse mouth afterwards  Steroid shot today Use your flutter valve after your neb treatments  Chest x ray today    Follow up in 6 weeks with Dr. Francine Graven or Philis Nettle. If symptoms do not improve or worsen, please contact office for sooner follow up or seek emergency care.

## 2023-01-01 NOTE — Patient Instructions (Addendum)
-  Continue Albuterol inhaler 2 puffs or 3 mL every 6 hours as needed for shortness of breath or wheezing. Notify if symptoms persist despite rescue inhaler/neb use.  -Restart mucinex 1 tab Twice daily for cough/congesiton -Continue hydromet cough syrup 5 mL every 6 hours as needed for cough. Do not drive after taking. May cause drowsiness   -Continue Benzonatate 1 capsule Three times a day for cough. Use consistently over the next few days to calm your cough down then as needed -Continue Flonase nasal spray 2 sprays each nostril daily for nasal congestion/drainage   Stop Anoro. Start Trelegy 1 puff daily. Brush tongue and rinse mouth afterwards  Steroid shot today Use your flutter valve after your neb treatments  Chest x ray today    Follow up in 6 weeks with Dr. Francine Graven or Philis Nettle. If symptoms do not improve or worsen, please contact office for sooner follow up or seek emergency care.

## 2023-01-01 NOTE — Assessment & Plan Note (Signed)
See above. She still needs formal PFTs. Will schedule at follow up.

## 2023-01-01 NOTE — Progress Notes (Signed)
@Patient  ID: [name], female    DOB: 1945-08-31, 77 y.o.   MRN: [id number]  Chief Complaint  Patient presents with   Follow-up    F/U after bronchitis. States her breathing has improved since last visit. Has been using her nebulizer every 3 days due to congestion. Productive cough with clear phlegm. Still using Anoro once daily.     Referring provider: Kristian Covey, MD  HPI: 77 year old female, former smoker followed for emphysema. She is a patient of Dr. Lanora Manis and last seen in office 11/16/2022 by [facility] NP. Past medical history significant for allergic rhinitis, osteoporosis, hx of bladder cancer, HLD, depression.  TEST/EVENTS:  07/19/2022 CTA chest: no evidence of PE. Atherosclerosis. Decompressed esophagus. Moderate emphysema. Bandlike atelectasis within the lingula, RML and both lower lobes. Previous lung nodules obscured due to motion.  10/30/2022 CXR: diffuse pattern of perihilar bronchial thickening, more affecting the lower lung zones and slightly more prominent on the right compared to the left.   08/14/2022: OV with Dr. Francine Graven for pulmonary consult. Referred for COPD exacerbation. Admitted 1/27-2/8 for acute cholecystitis and developed issues with her breathing - treated for AECOPD. Reports every fall she gets sick with cough, wheezing, SOB. Usually lasts about 3 weeks. Not on inhalers in the past. Former smoker with 75 pack year history. Recovered well since recent admission. No acute issues with her breathing at this time. Preferred to hold off on maintenance therapy until PFTs. F/u 4 months with PFTs.  10/30/2022: OV with Curt Oatis NP for acute visit. She had COVID around 4/11. She was treated with Paxlovid. She had fevers and chills during her initial illness as well as some URI symptoms and occasional dry cough. She felt like she was recovering but then over the past week has started to feel worse. She has a productive cough with yellow phlegm and is having a lot of chest  congestion. She's feeling very fatigued and appetite is not as good as it normally is. She's more short of breath during the day, especially with longer distances. She's also wheezing at night, which she normally doesn't do. She does still have some sinus congestion and occasionally feels like she has drainage down the back of her throat. She denies any recurrent fevers, chills, hemoptysis, leg swelling, orthopnea. She does not have a rescue inhaler or maintenance. Taking cough syrup she had previously been prescribed and mucinex over the counter.   11/16/2022:  OV with Tora Prunty NP for follow up. She is feeling much better compared to her last visit but she does feel like since she came off the steroids and abx, that her cough has started to pick back up again. It can be barky at times. She occasionally produces some white phlegm but sometimes feels like it gets stuck. Her breathing has improved, feels closer to her baseline but still a little more short than normal. She's still noticing wheezing at night, which restarted after she completed steroids. Sinus symptoms have resolved. Denies any fevers, chills, hemoptysis, leg swelling, orthopnea. She has not had to use the rescue inhaler. Insurance wouldn't cover Stiolto so she was prescribed Anoro, which she feels like is helping.   01/01/2023: Today - follow up Patient presents today for follow up. After our last visit, she was treated with Z pack and prednisone. She felt better after this. Her breathing feels back to her baseline. She's still coughing, which seems to have picked up some after she finished the steroids. It is primarily  dry but occasionally she will get up a small amount of clear phlegm. She uses her nebs every 3 days or so when her chest is feeling more congested, which does help. She is not having any sinus symptoms or reflux. Some wheezing at night. She denies fevers, chills,  hemoptysis, leg swelling, calf pain. She is using her Anoro every  morning. Does feel like this has helped.   FeNO 26 ppb  Allergies  Allergen Reactions   Aspirin Other (See Comments)     vasculitis    Immunization History  Administered Date(s) Administered   Fluad Quad(high Dose 65+) 01/26/2022   Hepatitis A, Adult 05/03/2015   Influenza Split 03/14/2011, 04/29/2012, 02/20/2013   Influenza Whole 03/19/2010   Influenza, High Dose Seasonal PF 03/26/2014, 03/11/2017, 02/04/2018, 02/11/2019   Influenza-Unspecified 03/16/2015, 03/06/2016, 03/11/2017, 02/06/2020, 03/14/2021, 01/26/2022   PFIZER(Purple Top)SARS-COV-2 Vaccination 07/14/2019, 08/04/2019, 02/16/2020   PNEUMOCOCCAL CONJUGATE-20 06/16/2021   Pfizer Covid-19 Vaccine Bivalent Booster 12yrs & up 01/13/2021, 06/16/2021   Pneumococcal Conjugate-13 01/02/2017, 09/01/2019   Pneumococcal Polysaccharide-23 11/03/2009   Rsv, Bivalent, Protein Subunit Rsvpref,pf Verdis Frederickson) 03/24/2022   Td 06/19/2006, 08/27/2007   Tdap 06/06/2019   Unspecified SARS-COV-2 Vaccination 03/24/2022   Zoster Recombinant(Shingrix) 03/11/2017, 06/30/2017    Past Medical History:  Diagnosis Date   Anxiety    aniety attacks   Bladder tumor    Colon polyps    COPD (chronic obstructive pulmonary disease) (HCC)    09-08-2020 acute flare up with cough finished antibiotics and steroids cough resolved now   Depression    Hay fever    allergies   History of kidney stones    Migraines    not in 40 years   Wears glasses     Tobacco History: Social History   Tobacco Use  Smoking Status Former   Current packs/day: 0.00   Average packs/day: 1.5 packs/day for 50.0 years (75.0 ttl pk-yrs)   Types: Cigarettes   Start date: 66   Quit date: 2012   Years since quitting: 12.5  Smokeless Tobacco Never  Tobacco Comments   Former smoker   Counseling given: Not Answered Tobacco comments: Former smoker   Outpatient Medications Prior to Visit  Medication Sig Dispense Refill   albuterol (PROVENTIL) (2.5 MG/3ML) 0.083%  nebulizer solution Take 3 mLs (2.5 mg total) by nebulization every 6 (six) hours as needed for wheezing or shortness of breath. 75 mL 12   albuterol (VENTOLIN HFA) 108 (90 Base) MCG/ACT inhaler Inhale 2 puffs into the lungs every 6 (six) hours as needed for wheezing or shortness of breath. 8 g 2   Ascorbic Acid (VITAMIN C) 1000 MG tablet Take 1,000 mg by mouth daily.     Calcium Carbonate (CALCIUM 600 PO) Take 600 mg by mouth daily in the afternoon.     FLUoxetine (PROZAC) 20 MG capsule TAKE 1 CAPSULE BY MOUTH EVERY DAY 90 capsule 0   fluticasone (FLONASE) 50 MCG/ACT nasal spray Place 1 spray into both nostrils daily. 18.2 mL 2   guaiFENesin (MUCINEX) 600 MG 12 hr tablet Take 1 tablet (600 mg total) by mouth 2 (two) times daily as needed for to loosen phlegm.     VITAMIN D PO Take by mouth.     umeclidinium-vilanterol (ANORO ELLIPTA) 62.5-25 MCG/ACT AEPB Inhale 1 puff into the lungs daily. 60 each 5   azithromycin (ZITHROMAX) 250 MG tablet Take 2 tablets on day one then take 1 tablet daily for four additional days 6 tablet 0   predniSONE (DELTASONE)  10 MG tablet 4 tabs for 3 days, then 3 tabs for 3 days, 2 tabs for 3 days, then 1 tab for 3 days, then stop 30 tablet 0   No facility-administered medications prior to visit.     Review of Systems:   Constitutional: No weight loss or gain, night sweats, fevers, chills, or lassitude, fatigue HEENT: No headaches, difficulty swallowing, tooth/dental problems, or sore throat. No sneezing, itching, ear ache, nasal congestion, post nasal drip CV:  No chest pain, orthopnea, PND, swelling in lower extremities, anasarca, dizziness, palpitations, syncope Resp: +persistent cough; nocturnal wheeze. No shortness of breath with exertion or rest. No hemoptysis. No chest wall deformity GI:  No heartburn, indigestion, abdominal pain, nausea, vomiting, diarrhea, change in bowel habits, bloody stools, loss of appetite.  GU: No dysuria, change in color of urine,  urgency or frequency. Skin: No rash, lesions, ulcerations MSK:  No joint pain or swelling.   Neuro: No dizziness or lightheadedness.  Psych: No depression or anxiety. Mood stable.     Physical Exam:  BP 118/70   Pulse 78   Ht 5\' 7"  (1.702 m)   Wt 165 lb (74.8 kg)   SpO2 97% Comment: on RA  BMI 25.84 kg/m   GEN: Pleasant, interactive, well-kempt; in no acute distress. HEENT:  Normocephalic and atraumatic. PERRLA. Sclera white. Nasal turbinates pink, moist and patent bilaterally. Clear rhinorrhea present. Maxillary sinus tenderness. Oropharynx pink and moist, without exudate or edema. No lesions, ulcerations, or postnasal drip.  NECK:  Supple w/ fair ROM. No JVD present. Normal carotid impulses w/o bruits. Thyroid symmetrical with no goiter or nodules palpated. No lymphadenopathy.   CV: RRR, no m/r/g, no peripheral edema. Pulses intact, +2 bilaterally. No cyanosis, pallor or clubbing. PULMONARY:  Unlabored, regular breathing. Minimal end expiratory wheeze b/l lower lobes otherwise clear. No accessory muscle use.  GI: BS present and normoactive. Soft, non-tender to palpation. No organomegaly or masses detected.  MSK: No erythema, warmth or tenderness. Cap refil <2 sec all extrem. No deformities or joint swelling noted.  Neuro: A/Ox3. No focal deficits noted.   Skin: Warm, no lesions or rashe Psych: Normal affect and behavior. Judgement and thought content appropriate.     Lab Results:  CBC    Component Value Date/Time   WBC 12.0 (H) 07/27/2022 0342   RBC 3.82 (L) 07/27/2022 0342   HGB 11.6 (L) 07/27/2022 0342   HCT 35.7 (L) 07/27/2022 0342   PLT 352 07/27/2022 0342   MCV 93.5 07/27/2022 0342   MCH 30.4 07/27/2022 0342   MCHC 32.5 07/27/2022 0342   RDW 13.9 07/27/2022 0342   LYMPHSABS 1.1 07/15/2022 1708   MONOABS 1.6 (H) 07/15/2022 1708   EOSABS 0.0 07/15/2022 1708   BASOSABS 0.0 07/15/2022 1708    BMET    Component Value Date/Time   NA 140 07/26/2022 0755   K 3.5  07/26/2022 0755   CL 102 07/26/2022 0755   CO2 26 07/26/2022 0755   GLUCOSE 116 (H) 07/26/2022 0755   BUN 8 07/26/2022 0755   CREATININE 0.74 07/26/2022 0755   CALCIUM 8.6 (L) 07/26/2022 0755   GFRNONAA >60 07/26/2022 0755   GFRAA  02/09/2010 1100    >60        The eGFR has been calculated using the MDRD equation. This calculation has not been validated in all clinical situations. eGFR's persistently <60 mL/min signify possible Chronic Kidney Disease.    BNP No results found for: "BNP"   Imaging:  No results  found.  methylPREDNISolone acetate (DEPO-MEDROL) injection 80 mg     Date Action Dose Route User   01/01/2023 1058 Given 80 mg Intramuscular (Right Upper Outer Quadrant) Potts, Cherina M, CMA           No data to display          Lab Results  Component Value Date   NITRICOXIDE 26 01/01/2023        Assessment & Plan:   Acute bronchitis Slow to resolve with elevated exhaled nitric oxide. Will treat her with depo 80 inj x 1 and add on ICS to current inhaler regimen. She will continue cough control and mucociliary clearance regimens. CXR today to rule out superimposed infection. Action plan in place. If no improvement, will need CT chest imaging for further evaluation.   Patient Instructions  -Continue Albuterol inhaler 2 puffs or 3 mL every 6 hours as needed for shortness of breath or wheezing. Notify if symptoms persist despite rescue inhaler/neb use.  -Restart mucinex 1 tab Twice daily for cough/congesiton -Continue hydromet cough syrup 5 mL every 6 hours as needed for cough. Do not drive after taking. May cause drowsiness   -Continue Benzonatate 1 capsule Three times a day for cough. Use consistently over the next few days to calm your cough down then as needed -Continue Flonase nasal spray 2 sprays each nostril daily for nasal congestion/drainage   Stop Anoro. Start Trelegy 1 puff daily. Brush tongue and rinse mouth afterwards  Steroid shot  today Use your flutter valve after your neb treatments  Chest x ray today    Follow up in 6 weeks with Dr. Francine Graven or Philis Nettle. If symptoms do not improve or worsen, please contact office for sooner follow up or seek emergency care.    Centrilobular emphysema (HCC) See above. She still needs formal PFTs. Will schedule at follow up.   I spent 35 minutes of dedicated to the care of this patient on the date of this encounter to include pre-visit review of records, face-to-face time with the patient discussing conditions above, post visit ordering of testing, clinical documentation with the electronic health record, making appropriate referrals as documented, and communicating necessary findings to members of the patients care team.  Noemi Chapel, NP 01/01/2023  Pt aware and understands NP's role.

## 2023-01-16 DIAGNOSIS — U099 Post covid-19 condition, unspecified: Secondary | ICD-10-CM | POA: Diagnosis not present

## 2023-01-16 DIAGNOSIS — J40 Bronchitis, not specified as acute or chronic: Secondary | ICD-10-CM | POA: Diagnosis not present

## 2023-01-26 ENCOUNTER — Encounter (HOSPITAL_COMMUNITY): Payer: Self-pay | Admitting: Gastroenterology

## 2023-01-26 NOTE — Progress Notes (Signed)
[name]  Prep instructions- n/a  PCP-Bruce Burchette MD Pulmonologist- Micheline Maze NP/ Dr Francine Graven  CT chest- 01/01/23 EKG- n/a Echo-n/a Cath-n/a Stress- n/a ICD/PM-n/a Blood thinner- n/a GLP-1- n/a  NW:GNFA, Emphysema, osteoporosis, hyperlipidema, hx of bladder cancer. Last office visit with Pulm on 01/01/23 for a f/u of bronchitis. I asked patient about this and she said shes doing better with the new med Trelegy but still having some effects from bronchitis with slight coughing.  At that visit they do order PFT's and that is scheduled for 02/12/23 and also a chest ct was done on that visit to ensure no current infections.That scan showed "minimal bronchitic changes with improvement".    Anesthesia Review: Yes

## 2023-01-30 ENCOUNTER — Telehealth: Payer: Self-pay | Admitting: Nurse Practitioner

## 2023-01-30 NOTE — Telephone Encounter (Signed)
Fax received from Dr. Elnoria Howard with [facility] to perform a ERCP with propofol on patient.  Patient needs surgery clearance. Surgery is 02/02/2023. Patient was seen on 01/01/23. Office protocol is a risk assessment can be sent to surgeon if patient has been seen in 60 days or less.   Sending to [facility] for risk assessment or recommendations if patient needs to be seen in office prior to surgical procedure.

## 2023-02-01 NOTE — Anesthesia Preprocedure Evaluation (Addendum)
Anesthesia Evaluation  Patient identified by MRN, date of birth, ID band Patient awake    Reviewed: Allergy & Precautions, NPO status , Patient's Chart, lab work & pertinent test results  Airway Mallampati: II  TM Distance: >3 FB Neck ROM: Full    Dental no notable dental hx. (+) Implants, Dental Advisory Given, Teeth Intact   Pulmonary COPD,  COPD inhaler, former smoker emphysema   Pulmonary exam normal breath sounds clear to auscultation       Cardiovascular Normal cardiovascular exam Rhythm:Regular Rate:Normal     Neuro/Psych  Headaches PSYCHIATRIC DISORDERS Anxiety Depression       GI/Hepatic   Endo/Other    Renal/GU Renal disease   Hx of Bladder CA    Musculoskeletal   Abdominal   Peds  Hematology   Anesthesia Other Findings All: ASA  Reproductive/Obstetrics                             Anesthesia Physical Anesthesia Plan  ASA: 3  Anesthesia Plan: General   Post-op Pain Management: Minimal or no pain anticipated   Induction: Intravenous  PONV Risk Score and Plan: Treatment may vary due to age or medical condition  Airway Management Planned: Oral ETT  Additional Equipment: None  Intra-op Plan:   Post-operative Plan: Extubation in OR  Informed Consent: I have reviewed the patients History and Physical, chart, labs and discussed the procedure including the risks, benefits and alternatives for the proposed anesthesia with the patient or authorized representative who has indicated his/her understanding and acceptance.     Dental advisory given  Plan Discussed with: CRNA and Anesthesiologist  Anesthesia Plan Comments:         Anesthesia Quick Evaluation

## 2023-02-01 NOTE — Telephone Encounter (Signed)
Needs OV first. She had acute symptoms requiring steroid injection at her last OV. Will assess for preoperative eval at follow up 8/26. Thanks.

## 2023-02-02 ENCOUNTER — Ambulatory Visit (HOSPITAL_COMMUNITY): Payer: Medicare HMO

## 2023-02-02 ENCOUNTER — Encounter (HOSPITAL_COMMUNITY)
Admission: RE | Disposition: A | Discharge status: Home / Self Care | Payer: Self-pay | Source: Home / Self Care | Attending: Gastroenterology

## 2023-02-02 ENCOUNTER — Other Ambulatory Visit: Payer: Self-pay

## 2023-02-02 ENCOUNTER — Ambulatory Visit (HOSPITAL_BASED_OUTPATIENT_CLINIC_OR_DEPARTMENT_OTHER): Payer: Medicare HMO | Admitting: Anesthesiology

## 2023-02-02 ENCOUNTER — Encounter (HOSPITAL_COMMUNITY): Payer: Self-pay | Admitting: Gastroenterology

## 2023-02-02 ENCOUNTER — Ambulatory Visit (HOSPITAL_COMMUNITY): Payer: Medicare HMO | Admitting: Anesthesiology

## 2023-02-02 ENCOUNTER — Ambulatory Visit (HOSPITAL_COMMUNITY)
Admission: RE | Admit: 2023-02-02 | Discharge: 2023-02-02 | Disposition: A | Discharge status: Home / Self Care | Payer: Medicare HMO | Attending: Gastroenterology | Admitting: Gastroenterology

## 2023-02-02 DIAGNOSIS — F418 Other specified anxiety disorders: Secondary | ICD-10-CM | POA: Diagnosis not present

## 2023-02-02 DIAGNOSIS — E785 Hyperlipidemia, unspecified: Secondary | ICD-10-CM | POA: Diagnosis not present

## 2023-02-02 DIAGNOSIS — Z87891 Personal history of nicotine dependence: Secondary | ICD-10-CM | POA: Insufficient documentation

## 2023-02-02 DIAGNOSIS — Z4682 Encounter for fitting and adjustment of non-vascular catheter: Secondary | ICD-10-CM | POA: Diagnosis not present

## 2023-02-02 DIAGNOSIS — Z4659 Encounter for fitting and adjustment of other gastrointestinal appliance and device: Secondary | ICD-10-CM | POA: Insufficient documentation

## 2023-02-02 DIAGNOSIS — J449 Chronic obstructive pulmonary disease, unspecified: Secondary | ICD-10-CM

## 2023-02-02 DIAGNOSIS — Z87442 Personal history of urinary calculi: Secondary | ICD-10-CM | POA: Insufficient documentation

## 2023-02-02 DIAGNOSIS — T85590A Other mechanical complication of bile duct prosthesis, initial encounter: Secondary | ICD-10-CM | POA: Diagnosis not present

## 2023-02-02 DIAGNOSIS — K832 Perforation of bile duct: Secondary | ICD-10-CM | POA: Diagnosis not present

## 2023-02-02 HISTORY — PX: STENT REMOVAL: SHX6421

## 2023-02-02 HISTORY — PX: ENDOSCOPIC RETROGRADE CHOLANGIOPANCREATOGRAPHY (ERCP) WITH PROPOFOL: SHX5810

## 2023-02-02 SURGERY — ENDOSCOPIC RETROGRADE CHOLANGIOPANCREATOGRAPHY (ERCP) WITH PROPOFOL
Anesthesia: General

## 2023-02-02 MED ORDER — DICLOFENAC SUPPOSITORY 100 MG
RECTAL | Status: DC | PRN
  Administered 2023-02-02: 100 mg via RECTAL

## 2023-02-02 MED ORDER — LACTATED RINGERS IV SOLN
INTRAVENOUS | Status: AC | PRN
Start: 2023-02-02 — End: 2023-02-02
  Administered 2023-02-02: 1000 mL via INTRAVENOUS

## 2023-02-02 MED ORDER — CIPROFLOXACIN IN D5W 400 MG/200ML IV SOLN
INTRAVENOUS | Status: DC | PRN
  Administered 2023-02-02: 400 mg via INTRAVENOUS

## 2023-02-02 MED ORDER — CIPROFLOXACIN IN D5W 400 MG/200ML IV SOLN
INTRAVENOUS | Status: AC
  Filled 2023-02-02: qty 200

## 2023-02-02 MED ORDER — LACTATED RINGERS IV SOLN: INTRAVENOUS | Status: DC | PRN

## 2023-02-02 MED ORDER — DICLOFENAC SUPPOSITORY 100 MG
RECTAL | Status: AC
  Filled 2023-02-02: qty 1

## 2023-02-02 MED ORDER — GLUCAGON HCL RDNA (DIAGNOSTIC) 1 MG IJ SOLR
INTRAMUSCULAR | Status: AC
  Filled 2023-02-02: qty 2

## 2023-02-02 MED ORDER — DEXAMETHASONE SODIUM PHOSPHATE 4 MG/ML IJ SOLN
INTRAMUSCULAR | Status: DC | PRN
Start: 2023-02-02 — End: 2023-02-02
  Administered 2023-02-02: 5 mg via INTRAVENOUS

## 2023-02-02 MED ORDER — PHENYLEPHRINE HCL (PRESSORS) 10 MG/ML IV SOLN
INTRAVENOUS | Status: AC
  Filled 2023-02-02: qty 1

## 2023-02-02 MED ORDER — FENTANYL CITRATE (PF) 100 MCG/2ML IJ SOLN
INTRAMUSCULAR | Status: DC | PRN
  Administered 2023-02-02: 50 ug via INTRAVENOUS

## 2023-02-02 MED ORDER — FENTANYL CITRATE (PF) 100 MCG/2ML IJ SOLN
INTRAMUSCULAR | Status: AC
  Filled 2023-02-02: qty 2

## 2023-02-02 MED ORDER — SODIUM CHLORIDE 0.9 % IV SOLN: INTRAVENOUS | Status: DC

## 2023-02-02 MED ORDER — SUGAMMADEX SODIUM 200 MG/2ML IV SOLN
INTRAVENOUS | Status: DC | PRN
Start: 2023-02-02 — End: 2023-02-02
  Administered 2023-02-02: 200 mg via INTRAVENOUS

## 2023-02-02 MED ORDER — PHENYLEPHRINE HCL-NACL 20-0.9 MG/250ML-% IV SOLN
INTRAVENOUS | Status: DC | PRN
  Administered 2023-02-02: 25 ug/min via INTRAVENOUS

## 2023-02-02 MED ORDER — SODIUM CHLORIDE 0.9 % IV SOLN
INTRAVENOUS | Status: DC | PRN
  Administered 2023-02-02: 20 mL

## 2023-02-02 MED ORDER — ROCURONIUM BROMIDE 100 MG/10ML IV SOLN
INTRAVENOUS | Status: DC | PRN
  Administered 2023-02-02: 60 mg via INTRAVENOUS

## 2023-02-02 MED ORDER — EPHEDRINE SULFATE (PRESSORS) 50 MG/ML IJ SOLN
INTRAMUSCULAR | Status: DC | PRN
Start: 2023-02-02 — End: 2023-02-02
  Administered 2023-02-02 (×3): 10 mg via INTRAVENOUS

## 2023-02-02 MED ORDER — PROPOFOL 10 MG/ML IV BOLUS
INTRAVENOUS | Status: DC | PRN
  Administered 2023-02-02: 150 mg via INTRAVENOUS

## 2023-02-02 MED ORDER — LIDOCAINE HCL (CARDIAC) PF 100 MG/5ML IV SOSY
PREFILLED_SYRINGE | INTRAVENOUS | Status: DC | PRN
  Administered 2023-02-02: 80 mg via INTRAVENOUS

## 2023-02-02 MED ORDER — ONDANSETRON HCL 4 MG/2ML IJ SOLN
INTRAMUSCULAR | Status: DC | PRN
  Administered 2023-02-02: 4 mg via INTRAVENOUS

## 2023-02-02 NOTE — Anesthesia Procedure Notes (Signed)
Procedure Name: Intubation Date/Time: 02/02/2023 7:50 AM  Performed by: Deri Fuelling, CRNAPre-anesthesia Checklist: Patient identified, Emergency Drugs available, Suction available and Patient being monitored Patient Re-evaluated:Patient Re-evaluated prior to induction Oxygen Delivery Method: Circle system utilized Preoxygenation: Pre-oxygenation with 100% oxygen Induction Type: IV induction Ventilation: Mask ventilation without difficulty Laryngoscope Size: 4 and Mac Grade View: Grade I Tube type: Oral Tube size: 7.0 mm Number of attempts: 1 Airway Equipment and Method: Stylet and Oral airway Placement Confirmation: ETT inserted through vocal cords under direct vision, positive ETCO2 and breath sounds checked- equal and bilateral Secured at: 21 cm Tube secured with: Tape Dental Injury: Teeth and Oropharynx as per pre-operative assessment

## 2023-02-02 NOTE — Telephone Encounter (Signed)
Surgical Clearance no longer needed. Patient is currently admitted now for procedure. Closing encounter. NFN

## 2023-02-02 NOTE — H&P (Signed)
[name] HPI: Earlier in the year the patient suffered a bile leak from the cholecystectomy.  An 8.5 Fr x 5 cm plastic biliary stent was successfully placed.  She did not have any further biliary problems since that time.  Past Medical History:  Diagnosis Date   Anxiety    aniety attacks   Bladder tumor    Colon polyps    COPD (chronic obstructive pulmonary disease) (HCC)    09-08-2020 acute flare up with cough finished antibiotics and steroids cough resolved now   Depression    Hay fever    allergies   History of kidney stones    Migraines    not in 40 years   Wears glasses     Past Surgical History:  Procedure Laterality Date   BILIARY STENT PLACEMENT  07/25/2022   Procedure: BILIARY STENT PLACEMENT;  Surgeon: Jeani Hawking, MD;  Location: [facility] ENDOSCOPY;  Service: Gastroenterology;;   BREAST BIOPSY  1990   caesarean secion  1974, 1977, 1984   CESAREAN SECTION  [phone]   CHOLECYSTECTOMY N/A 07/17/2022   Procedure: LAPAROSCOPIC SUBTOTAL CHOLECYSTECTOMY;  Surgeon: Griselda Miner, MD;  Location[facility] OR;  Service: General;  Laterality: N/A;   COLONOSCOPY W/ POLYPECTOMY  last done 2016    x 3    CYSTOSCOPY N/A 09/21/2020   Procedure: CYSTOSCOPY;  Surgeon: Noel Christmas, MD;  Location: [facility];  Service: Urology;  Laterality: N/A;   CYSTOSCOPY W/ RETROGRADES Bilateral 02/04/2020   Procedure: CYSTOSCOPY WITH RETROGRADE PYELOGRAM;  Surgeon: Crista Elliot, MD;  Location: [facility];  Service: Urology;  Laterality: Bilateral;   ERCP N/A 07/22/2022   Procedure: ENDOSCOPIC RETROGRADE CHOLANGIOPANCREATOGRAPHY (ERCP);  Surgeon: Vida Rigger, MD;  Location: [facility] ENDOSCOPY;  Service: Gastroenterology;  Laterality: N/A;   ERCP N/A 07/25/2022   Procedure: ENDOSCOPIC RETROGRADE CHOLANGIOPANCREATOGRAPHY (ERCP);  Surgeon: Jeani Hawking, MD;  Location: [facility] ENDOSCOPY;  Service: Gastroenterology;  Laterality: N/A;   FINGER FRACTURE SURGERY Right  07/11/2022   5th digit has a screw   OPEN REDUCTION INTERNAL FIXATION (ORIF) DISTAL RADIAL FRACTURE Right 11/03/2014   Procedure: OPEN REDUCTION INTERNAL FIXATION (ORIF) RIGHT DISTAL RADIAL FRACTURE WITH REPAIR AND RECONSTRUCTION;  Surgeon: Dominica Severin, MD;  Location: [facility] OR;  Service: Orthopedics;  Laterality: Right;   PANCREATIC STENT PLACEMENT  07/22/2022   Procedure: PANCREATIC STENT PLACEMENT;  Surgeon: Vida Rigger, MD;  Location: [facility] ENDOSCOPY;  Service: Gastroenterology;;   Dennison Mascot  07/22/2022   Procedure: Dennison Mascot;  Surgeon: Vida Rigger, MD;  Location: [facility] ENDOSCOPY;  Service: Gastroenterology;;   TRANSURETHRAL RESECTION OF BLADDER TUMOR WITH MITOMYCIN-C N/A 02/04/2020   Procedure: TRANSURETHRAL RESECTION OF BLADDER TUMOR WITH POST OPERATIVE GEMCITABINE;  Surgeon: Crista Elliot, MD;  Location: [facility];  Service: Urology;  Laterality: N/A;   TRANSURETHRAL RESECTION OF BLADDER TUMOR WITH MITOMYCIN-C N/A 09/21/2020   Procedure: CYSTOSCOPY WITH BLADDER FULGARATION WITH POST OP GEMCITABINE;  Surgeon: Noel Christmas, MD;  Location: [facility] ;  Service: Urology;  Laterality: N/A;  1 HR    Family History  Problem Relation Age of Onset   Arthritis Other    Stroke Other    Hypertension Other    Depression Other    Migraines Other    Colon polyps Other    Breast cancer Neg Hx     Social History:  reports that she quit smoking about 12 years ago. Her smoking use included cigarettes. She started smoking about 62 years  ago. She has a 75 pack-year smoking history. She has never used smokeless tobacco. She reports current alcohol use of about 5.0 standard drinks of alcohol per week. She reports that she does not use drugs.  Allergies:  Allergies  Allergen Reactions   Aspirin Other (See Comments)     vasculitis    Medications: Scheduled: Continuous:  sodium chloride     lactated ringers 1,000 mL (02/02/23 0703)    No results found  for this or any previous visit (from the past 24 hour(s)).   No results found.  ROS:  As stated above in the HPI otherwise negative.  Blood pressure 131/83, pulse 60, temperature 97.8 F (36.6 C), temperature source Tympanic, resp. rate 20, height 5\' 7"  (1.702 m), weight 74.8 kg, SpO2 95%.    PE: Gen: NAD, Alert and Oriented HEENT:  /AT, EOMI Neck: Supple, no LAD Lungs: CTA Bilaterally CV: RRR without M/G/R ABD: Soft, NTND, +BS Ext: No C/C/E  Assessment/Plan: 1) S/p Bile leak - ERCP with stent removal.  Teigen Parslow D 02/02/2023, 7:24 AM

## 2023-02-02 NOTE — Op Note (Signed)
[facility] Patient Name: [name] Procedure Date: 02/02/2023 MRN: [id number] Attending MD: Jeani Hawking , MD, [id number] Date of Birth: [birth year] CSN: [id number] Age: 77 Admit Type: Outpatient Procedure:                ERCP Indications:              Biliary stent removal Providers:                Jeani Hawking, MD, Fransisca Connors, Salley Scarlet, Technician, Deri Fuelling, CRNA Referring MD:              Medicines:                General Anesthesia, Cipro 400 mg IV x 1 Complications:            No immediate complications. Estimated Blood Loss:     Estimated blood loss: none. Procedure:                Pre-Anesthesia Assessment:                           - Prior to the procedure, a History and Physical                            was performed, and patient medications and                            allergies were reviewed. The patient's tolerance of                            previous anesthesia was also reviewed. The risks                            and benefits of the procedure and the sedation                            options and risks were discussed with the patient.                            All questions were answered, and informed consent                            was obtained. Prior Anticoagulants: The patient has                            taken no anticoagulant or antiplatelet agents. ASA                            Grade Assessment: II - A patient with mild systemic                            disease. After reviewing the risks and benefits,  the patient was deemed in satisfactory condition to                            undergo the procedure.                           - Sedation was administered by an anesthesia                            professional. General anesthesia was attained.                           After obtaining informed consent, the scope was                            passed  under direct vision. Throughout the                            procedure, the patient's blood pressure, pulse, and                            oxygen saturations were monitored continuously. The                            [id number]-[id number] ([id number] Olympus duodenoscope was                            introduced through the mouth, and used to inject                            contrast into and used to inject contrast into the                            bile duct. The ERCP was accomplished without                            difficulty. The patient tolerated the procedure                            well. Scope In: Scope Out: Findings:      One plastic stent originating in the biliary tree was emerging from the       major papilla. The stent was partially occluded. One stent was removed       from the common bile duct using a rat-toothed forceps and snare. The       bile duct was deeply cannulated with the short-nosed traction       sphincterotome. Contrast was injected. I personally interpreted the bile       duct images. There was brisk flow of contrast through the ducts. Image       quality was excellent. Contrast extended to the gallbladder. Contrast       extended to the entire biliary tree. A short 0.035 inch Soft Jagwire was       passed into the biliary tree.      The scout fluoroscopic evaluation showed that the biliary stent was  in       position. Endoscopically the stent was flush with the ampulla. A       rat-toothed forcep was used to pull the stent into the duodenal lumen.       It was then captured with a snare and removed through the scope.       Cannulation of the CBD was performed with ease and the guidewire was       secured in the right intrahepatic ducts. Contrast injection showed       opacification of the entire biliary tree. There was some evidence of       contrast going into the cystic duct region. A balloon cholangiogram was       performed and this further opacified the  biliary tree as well as the       subtotal cholecystectomy. There was no clear evidence of leakage from       the remnant gallbladder, but realtime radiology review of the images       were obtained. The findings were consistent with a sealed subtotal       cholecystectomy. With prolonged observation there was no diffusion of       the contrast from the gallbladder. The decision was then made to       conclude the procedure. Impression:               - One partially occluded stent from the biliary                            tree was seen in the major papilla.                           - One stent was removed from the common bile duct. Moderate Sedation:      Not Applicable - Patient had care per Anesthesia. Recommendation:           - Patient has a contact number available for                            emergencies. The signs and symptoms of potential                            delayed complications were discussed with the                            patient. Return to normal activities tomorrow.                            Written discharge instructions were provided to the                            patient.                           - Follow up PRN. Procedure Code(s):        --- Professional ---                           [phone], Endoscopic retrograde  cholangiopancreatography (ERCP); with removal of                            foreign body(s) or stent(s) from biliary/pancreatic                            duct(s)                           [phone], Endoscopic catheterization of the biliary                            ductal system, radiological supervision and                            interpretation Diagnosis Code(s):        --- Professional ---                           T85.590A, Other mechanical complication of bile                            duct prosthesis, initial encounter                           Z46.59, Encounter for fitting and adjustment of                             other gastrointestinal appliance and device CPT copyright 2022 [employer]. All rights reserved. The codes documented in this report are preliminary and upon coder review may  be revised to meet current compliance requirements. Jeani Hawking, MD Jeani Hawking, MD 02/02/2023 8:47:15 AM This report has been signed electronically. Number of Addenda: 0

## 2023-02-02 NOTE — Anesthesia Postprocedure Evaluation (Signed)
Anesthesia Post Note  Patient: [name]  Procedure(s) Performed: ENDOSCOPIC RETROGRADE CHOLANGIOPANCREATOGRAPHY (ERCP) WITH PROPOFOL STENT REMOVAL     Patient location during evaluation: Endoscopy Anesthesia Type: General Level of consciousness: awake and alert Pain management: pain level controlled Vital Signs Assessment: post-procedure vital signs reviewed and stable Respiratory status: spontaneous breathing, nonlabored ventilation, respiratory function stable and patient connected to nasal cannula oxygen Cardiovascular status: blood pressure returned to baseline and stable Postop Assessment: no apparent nausea or vomiting Anesthetic complications: no   No notable events documented.  Last Vitals:  Vitals:   02/02/23 0910 02/02/23 0914  BP: 102/66 100/64  Pulse: 81 78  Resp: 16 14  Temp:    SpO2: 96% 94%    Last Pain:  Vitals:   02/02/23 0914  TempSrc:   PainSc: 0-No pain                 Trevor Iha

## 2023-02-02 NOTE — Discharge Instructions (Signed)

## 2023-02-02 NOTE — Transfer of Care (Signed)
Immediate Anesthesia Transfer of Care Note  Patient: [name]  Procedure(s) Performed: ENDOSCOPIC RETROGRADE CHOLANGIOPANCREATOGRAPHY (ERCP) WITH PROPOFOL STENT REMOVAL  Patient Location: PACU  Anesthesia Type:General  Level of Consciousness: awake and alert   Airway & Oxygen Therapy: Patient Spontanous Breathing and Patient connected to face mask oxygen  Post-op Assessment: Report given to RN and Post -op Vital signs reviewed and stable  Post vital signs: Reviewed and stable  Last Vitals:  Vitals Value Taken Time  BP    Temp    Pulse 91 02/02/23 0855  Resp 16 02/02/23 0855  SpO2 93 % 02/02/23 0855    Last Pain:  Vitals:   02/02/23 0653  TempSrc: Tympanic  PainSc: 0-No pain         Complications: No notable events documented.

## 2023-02-05 ENCOUNTER — Encounter (HOSPITAL_COMMUNITY): Payer: Self-pay | Admitting: Gastroenterology

## 2023-02-12 ENCOUNTER — Encounter: Payer: Self-pay | Admitting: Nurse Practitioner

## 2023-02-12 ENCOUNTER — Ambulatory Visit: Payer: Medicare HMO | Admitting: Nurse Practitioner

## 2023-02-12 VITALS — BP 112/80 | HR 68 | Ht 67.0 in | Wt 166.4 lb

## 2023-02-12 DIAGNOSIS — J432 Centrilobular emphysema: Secondary | ICD-10-CM

## 2023-02-12 DIAGNOSIS — J209 Acute bronchitis, unspecified: Secondary | ICD-10-CM | POA: Diagnosis not present

## 2023-02-12 NOTE — Progress Notes (Signed)
@Patient  ID: [name], female    DOB: 04/22/1946, 77 y.o.   MRN: [id number]  Chief Complaint  Patient presents with   Follow-up    Pt is here for Centrilobular Emphysema F/U visit.     Referring provider: Kristian Covey, MD  HPI: 77 year old female, former smoker followed for emphysema. She is a patient of Dr. Lanora Manis and last seen in office 01/01/2023 by [facility] NP. Past medical history significant for allergic rhinitis, osteoporosis, hx of bladder cancer, HLD, depression.  TEST/EVENTS:  07/19/2022 CTA chest: no evidence of PE. Atherosclerosis. Decompressed esophagus. Moderate emphysema. Bandlike atelectasis within the lingula, RML and both lower lobes. Previous lung nodules obscured due to motion.  10/30/2022 CXR: diffuse pattern of perihilar bronchial thickening, more affecting the lower lung zones and slightly more prominent on the right compared to the left.   08/14/2022: OV with Dr. Francine Graven for pulmonary consult. Referred for COPD exacerbation. Admitted 1/27-2/8 for acute cholecystitis and developed issues with her breathing - treated for AECOPD. Reports every fall she gets sick with cough, wheezing, SOB. Usually lasts about 3 weeks. Not on inhalers in the past. Former smoker with 75 pack year history. Recovered well since recent admission. No acute issues with her breathing at this time. Preferred to hold off on maintenance therapy until PFTs. F/u 4 months with PFTs.  10/30/2022: OV with Loraine Freid NP for acute visit. She had COVID around 4/11. She was treated with Paxlovid. She had fevers and chills during her initial illness as well as some URI symptoms and occasional dry cough. She felt like she was recovering but then over the past week has started to feel worse. She has a productive cough with yellow phlegm and is having a lot of chest congestion. She's feeling very fatigued and appetite is not as good as it normally is. She's more short of breath during the day, especially with  longer distances. She's also wheezing at night, which she normally doesn't do. She does still have some sinus congestion and occasionally feels like she has drainage down the back of her throat. She denies any recurrent fevers, chills, hemoptysis, leg swelling, orthopnea. She does not have a rescue inhaler or maintenance. Taking cough syrup she had previously been prescribed and mucinex over the counter.   11/16/2022:  OV with Baeleigh Devincent NP for follow up. She is feeling much better compared to her last visit but she does feel like since she came off the steroids and abx, that her cough has started to pick back up again. It can be barky at times. She occasionally produces some white phlegm but sometimes feels like it gets stuck. Her breathing has improved, feels closer to her baseline but still a little more short than normal. She's still noticing wheezing at night, which restarted after she completed steroids. Sinus symptoms have resolved. Denies any fevers, chills, hemoptysis, leg swelling, orthopnea. She has not had to use the rescue inhaler. Insurance wouldn't cover Stiolto so she was prescribed Anoro, which she feels like is helping.   01/01/2023: Ov with Trella Thurmond NP for follow up. After our last visit, she was treated with Z pack and prednisone. She felt better after this. Her breathing feels back to her baseline. She's still coughing, which seems to have picked up some after she finished the steroids. It is primarily dry but occasionally she will get up a small amount of clear phlegm. She uses her nebs every 3 days or so when her chest is feeling  more congested, which does help. She is not having any sinus symptoms or reflux. Some wheezing at night. She denies fevers, chills,  hemoptysis, leg swelling, calf pain. She is using her Anoro every morning. Does feel like this has helped.  FeNO 26 ppb  02/12/2023: Today - follow up Patient presents today for follow up. Her cough has resolved. She's feeling much better.  She feels like she's back to her baseline. Breathing is doing well. The mucinex helps with her congestion. She's not having to use any cough suppressants. Hasn't had to do her nebs recently. Feels like the Trelegy is helping.   Allergies  Allergen Reactions   Aspirin Other (See Comments)     vasculitis    Immunization History  Administered Date(s) Administered   Fluad Quad(high Dose 65+) 01/26/2022   Hepatitis A, Adult 05/03/2015   Influenza Split 03/14/2011, 04/29/2012, 02/20/2013   Influenza Whole 03/19/2010   Influenza, High Dose Seasonal PF 03/26/2014, 03/11/2017, 02/04/2018, 02/11/2019   Influenza-Unspecified 03/16/2015, 03/06/2016, 03/11/2017, 02/06/2020, 03/14/2021, 01/26/2022   PFIZER(Purple Top)SARS-COV-2 Vaccination 07/14/2019, 08/04/2019, 02/16/2020   PNEUMOCOCCAL CONJUGATE-20 06/16/2021   Pfizer Covid-19 Vaccine Bivalent Booster 18yrs & up 01/13/2021, 06/16/2021   Pneumococcal Conjugate-13 01/02/2017, 09/01/2019   Pneumococcal Polysaccharide-23 11/03/2009   Rsv, Bivalent, Protein Subunit Rsvpref,pf Verdis Frederickson) 03/24/2022   Td 06/19/2006, 08/27/2007   Tdap 06/06/2019   Unspecified SARS-COV-2 Vaccination 03/24/2022   Zoster Recombinant(Shingrix) 03/11/2017, 06/30/2017    Past Medical History:  Diagnosis Date   Anxiety    aniety attacks   Bladder tumor    Colon polyps    COPD (chronic obstructive pulmonary disease) (HCC)    09-08-2020 acute flare up with cough finished antibiotics and steroids cough resolved now   Depression    Hay fever    allergies   History of kidney stones    Migraines    not in 40 years   Wears glasses     Tobacco History: Social History   Tobacco Use  Smoking Status Former   Current packs/day: 0.00   Average packs/day: 1.5 packs/day for 50.0 years (75.0 ttl pk-yrs)   Types: Cigarettes   Start date: 109   Quit date: 2012   Years since quitting: 12.6  Smokeless Tobacco Never  Tobacco Comments   Former smoker   Counseling given:  Not Answered Tobacco comments: Former smoker   Outpatient Medications Prior to Visit  Medication Sig Dispense Refill   albuterol (PROVENTIL) (2.5 MG/3ML) 0.083% nebulizer solution Take 3 mLs (2.5 mg total) by nebulization every 6 (six) hours as needed for wheezing or shortness of breath. 75 mL 12   albuterol (VENTOLIN HFA) 108 (90 Base) MCG/ACT inhaler Inhale 2 puffs into the lungs every 6 (six) hours as needed for wheezing or shortness of breath. 8 g 2   Ascorbic Acid (VITAMIN C) 1000 MG tablet Take 1,000 mg by mouth daily.     Calcium Carbonate (CALCIUM 600 PO) Take 600 mg by mouth daily in the afternoon.     FLUoxetine (PROZAC) 20 MG capsule TAKE 1 CAPSULE BY MOUTH EVERY DAY 90 capsule 0   fluticasone (FLONASE) 50 MCG/ACT nasal spray Place 1 spray into both nostrils daily. 18.2 mL 2   Fluticasone-Umeclidin-Vilant (TRELEGY ELLIPTA) 100-62.5-25 MCG/ACT AEPB Inhale 1 puff into the lungs daily. 60 each 5   Fluticasone-Umeclidin-Vilant (TRELEGY ELLIPTA) 100-62.5-25 MCG/ACT AEPB Inhale 1 puff into the lungs daily.     guaiFENesin (MUCINEX) 600 MG 12 hr tablet Take 1 tablet (600 mg total) by mouth 2 (two)  times daily as needed for to loosen phlegm.     VITAMIN D PO Take by mouth.     No facility-administered medications prior to visit.     Review of Systems:   Constitutional: No weight loss or gain, night sweats, fevers, chills, or lassitude, fatigue HEENT: No headaches, difficulty swallowing, tooth/dental problems, or sore throat. No sneezing, itching, ear ache, nasal congestion, post nasal drip CV:  No chest pain, orthopnea, PND, swelling in lower extremities, anasarca, dizziness, palpitations, syncope Resp: No shortness of breath with exertion or rest. No cough. No wheeze. No hemoptysis. No chest wall deformity GI:  No heartburn, indigestion, abdominal pain, nausea, vomiting, diarrhea, change in bowel habits, bloody stools, loss of appetite.  GU: No dysuria, change in color of urine,  urgency or frequency. Skin: No rash, lesions, ulcerations MSK:  No joint pain or swelling.   Neuro: No dizziness or lightheadedness.  Psych: No depression or anxiety. Mood stable.     Physical Exam:  BP 112/80 (BP Location: Right Arm, Cuff Size: Normal)   Pulse 68   Ht 5\' 7"  (1.702 m)   Wt 166 lb 6.4 oz (75.5 kg)   SpO2 94%   BMI 26.06 kg/m   GEN: Pleasant, interactive, well-appearing; in no acute distress. HEENT:  Normocephalic and atraumatic. PERRLA. Sclera white. Nasal turbinates pink, moist and patent bilaterally. Clear rhinorrhea present. Maxillary sinus tenderness. Oropharynx pink and moist, without exudate or edema. No lesions, ulcerations, or postnasal drip.  NECK:  Supple w/ fair ROM. No JVD present. Normal carotid impulses w/o bruits. Thyroid symmetrical with no goiter or nodules palpated. No lymphadenopathy.   CV: RRR, no m/r/g, no peripheral edema. Pulses intact, +2 bilaterally. No cyanosis, pallor or clubbing. PULMONARY:  Unlabored, regular breathing. Clear bilaterally A&P w/o wheezes/rales/rhonchi. No accessory muscle use.  GI: BS present and normoactive. Soft, non-tender to palpation. No organomegaly or masses detected.  MSK: No erythema, warmth or tenderness. Cap refil <2 sec all extrem. No deformities or joint swelling noted.  Neuro: A/Ox3. No focal deficits noted.   Skin: Warm, no lesions or rashe Psych: Normal affect and behavior. Judgement and thought content appropriate.     Lab Results:  CBC    Component Value Date/Time   WBC 12.0 (H) 07/27/2022 0342   RBC 3.82 (L) 07/27/2022 0342   HGB 11.6 (L) 07/27/2022 0342   HCT 35.7 (L) 07/27/2022 0342   PLT 352 07/27/2022 0342   MCV 93.5 07/27/2022 0342   MCH 30.4 07/27/2022 0342   MCHC 32.5 07/27/2022 0342   RDW 13.9 07/27/2022 0342   LYMPHSABS 1.1 07/15/2022 1708   MONOABS 1.6 (H) 07/15/2022 1708   EOSABS 0.0 07/15/2022 1708   BASOSABS 0.0 07/15/2022 1708    BMET    Component Value Date/Time   NA  140 07/26/2022 0755   K 3.5 07/26/2022 0755   CL 102 07/26/2022 0755   CO2 26 07/26/2022 0755   GLUCOSE 116 (H) 07/26/2022 0755   BUN 8 07/26/2022 0755   CREATININE 0.74 07/26/2022 0755   CALCIUM 8.6 (L) 07/26/2022 0755   GFRNONAA >60 07/26/2022 0755   GFRAA  02/09/2010 1100    >60        The eGFR has been calculated using the MDRD equation. This calculation has not been validated in all clinical situations. eGFR's persistently <60 mL/min signify possible Chronic Kidney Disease.    BNP No results found for: "BNP"   Imaging:  DG ERCP  Result Date: 02/02/2023 CLINICAL DATA:  Removal of biliary stent Evaluate for biliary leak History of subtotal cholecystectomy. EXAM: ERCP TECHNIQUE: Multiple spot images obtained with the fluoroscopic device and submitted for interpretation post-procedure. FLUOROSCOPY: Radiation Exposure Index (as provided by the fluoroscopic device): 24.9 mGy Kerma COMPARISON:  07/25/2022 FINDINGS: Seven images were submitted for interpretation. Initial images demonstrate a biliary common bile duct stent in place which was then subsequently removed. There is cannulation and opacification of intra and extrahepatic bile ducts. Collection of contrast is noted adjacent to the cystic duct, favored to be the gallbladder remnant rather than a leak. Additional linear collection of contrast located anterior to the common bile duct favored to be pancreatic duct rather than site of leak. IMPRESSION: Intraoperative cholangiogram demonstrates collection of contrast adjacent to the cystic duct which is favored to be gallbladder remnant rather than leak. These images were submitted for radiologic interpretation only. Please see the procedural report for the amount of contrast and the fluoroscopy time utilized. Electronically Signed   By: Acquanetta Belling M.D.   On: 02/02/2023 08:30    methylPREDNISolone acetate (DEPO-MEDROL) injection 80 mg     Date Action Dose Route User   Discharged  on 02/02/2023   Admitted on 02/02/2023   01/01/2023 1058 Given 80 mg Intramuscular (Right Upper Outer Quadrant) Potts, Maryan Puls, CMA           No data to display          Lab Results  Component Value Date   NITRICOXIDE 26 01/01/2023        Assessment & Plan:   Acute bronchitis Resolved and clinically improved.   Patient Instructions  -Continue Albuterol inhaler 2 puffs or 3 mL every 6 hours as needed for shortness of breath or wheezing. Notify if symptoms persist despite rescue inhaler/neb use.  -Continue Flonase nasal spray 2 sprays each nostril daily for nasal congestion/drainage  -Continue Trelegy 1 puff daily. Brush tongue and rinse mouth afterwards  -Continue guaifenesin 600 mg Twice daily for cough/congestion   Use your flutter valve after your neb treatments as needed for cough/congestion    Follow up in 3 months with Dr. Francine Graven or Philis Nettle. If symptoms do not improve or worsen, please contact office for sooner follow up or seek emergency care.    Centrilobular emphysema (HCC) Suspicion for smoking related obstructive lung disease, possibly with asthmatic component given elevated nitric oxide testing in the past. Clinically improved today with resolution in bronchitic symptoms. She still needs formal PFTs. Will plan to schedule with her f/u visit. She has received benefit from triple therapy regimen. Action plan in place.    I spent 25 minutes of dedicated to the care of this patient on the date of this encounter to include pre-visit review of records, face-to-face time with the patient discussing conditions above, post visit ordering of testing, clinical documentation with the electronic health record, making appropriate referrals as documented, and communicating necessary findings to members of the patients care team.  Noemi Chapel, NP 02/12/2023  Pt aware and understands NP's role.

## 2023-02-12 NOTE — Assessment & Plan Note (Signed)
Suspicion for smoking related obstructive lung disease, possibly with asthmatic component given elevated nitric oxide testing in the past. Clinically improved today with resolution in bronchitic symptoms. She still needs formal PFTs. Will plan to schedule with her f/u visit. She has received benefit from triple therapy regimen. Action plan in place.

## 2023-02-12 NOTE — Assessment & Plan Note (Signed)
Resolved and clinically improved.   Patient Instructions  -Continue Albuterol inhaler 2 puffs or 3 mL every 6 hours as needed for shortness of breath or wheezing. Notify if symptoms persist despite rescue inhaler/neb use.  -Continue Flonase nasal spray 2 sprays each nostril daily for nasal congestion/drainage  -Continue Trelegy 1 puff daily. Brush tongue and rinse mouth afterwards  -Continue guaifenesin 600 mg Twice daily for cough/congestion   Use your flutter valve after your neb treatments as needed for cough/congestion    Follow up in 3 months with Dr. Francine Graven or Philis Nettle. If symptoms do not improve or worsen, please contact office for sooner follow up or seek emergency care.

## 2023-02-12 NOTE — Patient Instructions (Signed)
-  Continue Albuterol inhaler 2 puffs or 3 mL every 6 hours as needed for shortness of breath or wheezing. Notify if symptoms persist despite rescue inhaler/neb use.  -Continue Flonase nasal spray 2 sprays each nostril daily for nasal congestion/drainage  -Continue Trelegy 1 puff daily. Brush tongue and rinse mouth afterwards  -Continue guaifenesin 600 mg Twice daily for cough/congestion   Use your flutter valve after your neb treatments as needed for cough/congestion    Follow up in 3 months with Dr. Francine Graven or Philis Nettle. If symptoms do not improve or worsen, please contact office for sooner follow up or seek emergency care.

## 2023-02-15 ENCOUNTER — Other Ambulatory Visit: Payer: Self-pay

## 2023-02-15 MED ORDER — FLUOXETINE HCL 20 MG PO CAPS: ORAL_CAPSULE | ORAL | 0 refills | Status: DC

## 2023-02-16 DIAGNOSIS — U099 Post covid-19 condition, unspecified: Secondary | ICD-10-CM | POA: Diagnosis not present

## 2023-02-16 DIAGNOSIS — J40 Bronchitis, not specified as acute or chronic: Secondary | ICD-10-CM | POA: Diagnosis not present

## 2023-03-19 DIAGNOSIS — U099 Post covid-19 condition, unspecified: Secondary | ICD-10-CM | POA: Diagnosis not present

## 2023-03-19 DIAGNOSIS — J40 Bronchitis, not specified as acute or chronic: Secondary | ICD-10-CM | POA: Diagnosis not present

## 2023-03-22 ENCOUNTER — Encounter (HOSPITAL_BASED_OUTPATIENT_CLINIC_OR_DEPARTMENT_OTHER): Payer: Self-pay

## 2023-03-22 ENCOUNTER — Other Ambulatory Visit: Payer: Self-pay

## 2023-03-22 ENCOUNTER — Emergency Department (HOSPITAL_BASED_OUTPATIENT_CLINIC_OR_DEPARTMENT_OTHER)
Admission: EM | Admit: 2023-03-22 | Discharge: 2023-03-22 | Disposition: A | Discharge status: Home / Self Care | Payer: Medicare HMO | Attending: Emergency Medicine | Admitting: Emergency Medicine

## 2023-03-22 ENCOUNTER — Emergency Department (HOSPITAL_BASED_OUTPATIENT_CLINIC_OR_DEPARTMENT_OTHER): Payer: Medicare HMO

## 2023-03-22 DIAGNOSIS — Z8551 Personal history of malignant neoplasm of bladder: Secondary | ICD-10-CM | POA: Insufficient documentation

## 2023-03-22 DIAGNOSIS — K429 Umbilical hernia without obstruction or gangrene: Secondary | ICD-10-CM | POA: Diagnosis not present

## 2023-03-22 DIAGNOSIS — N200 Calculus of kidney: Secondary | ICD-10-CM

## 2023-03-22 DIAGNOSIS — N132 Hydronephrosis with renal and ureteral calculous obstruction: Secondary | ICD-10-CM | POA: Insufficient documentation

## 2023-03-22 DIAGNOSIS — Z7951 Long term (current) use of inhaled steroids: Secondary | ICD-10-CM | POA: Insufficient documentation

## 2023-03-22 DIAGNOSIS — K5730 Diverticulosis of large intestine without perforation or abscess without bleeding: Secondary | ICD-10-CM | POA: Diagnosis not present

## 2023-03-22 DIAGNOSIS — J449 Chronic obstructive pulmonary disease, unspecified: Secondary | ICD-10-CM | POA: Insufficient documentation

## 2023-03-22 DIAGNOSIS — R109 Unspecified abdominal pain: Secondary | ICD-10-CM | POA: Diagnosis not present

## 2023-03-22 LAB — CBC WITH DIFFERENTIAL/PLATELET
Abs Immature Granulocytes: 0.03 10*3/uL (ref 0.00–0.07)
Basophils Absolute: 0 10*3/uL (ref 0.0–0.1)
Basophils Relative: 1 %
Eosinophils Absolute: 0 10*3/uL (ref 0.0–0.5)
Eosinophils Relative: 0 %
HCT: 43.7 % (ref 36.0–46.0)
Hemoglobin: 14.5 g/dL (ref 12.0–15.0)
Immature Granulocytes: 0 %
Lymphocytes Relative: 11 %
Lymphs Abs: 1 10*3/uL (ref 0.7–4.0)
MCH: 30.9 pg (ref 26.0–34.0)
MCHC: 33.2 g/dL (ref 30.0–36.0)
MCV: 93 fL (ref 80.0–100.0)
Monocytes Absolute: 0.4 10*3/uL (ref 0.1–1.0)
Monocytes Relative: 5 %
Neutro Abs: 7 10*3/uL (ref 1.7–7.7)
Neutrophils Relative %: 83 %
Platelets: 206 10*3/uL (ref 150–400)
RBC: 4.7 MIL/uL (ref 3.87–5.11)
RDW: 12.5 % (ref 11.5–15.5)
WBC: 8.5 10*3/uL (ref 4.0–10.5)
nRBC: 0 % (ref 0.0–0.2)

## 2023-03-22 LAB — BASIC METABOLIC PANEL
Anion gap: 12 (ref 5–15)
BUN: 20 mg/dL (ref 8–23)
CO2: 21 mmol/L — ABNORMAL LOW (ref 22–32)
Calcium: 9 mg/dL (ref 8.9–10.3)
Chloride: 105 mmol/L (ref 98–111)
Creatinine, Ser: 0.93 mg/dL (ref 0.44–1.00)
GFR, Estimated: >60 mL/min (ref >60)
Glucose, Bld: 167 mg/dL — ABNORMAL HIGH (ref 70–99)
Potassium: 4.3 mmol/L (ref 3.5–5.1)
Sodium: 138 mmol/L (ref 135–145)

## 2023-03-22 LAB — URINALYSIS, ROUTINE W REFLEX MICROSCOPIC
Bacteria, UA: NONE SEEN
Bilirubin Urine: NEGATIVE
Glucose, UA: 100 mg/dL — AB
Ketones, ur: NEGATIVE mg/dL
Nitrite: NEGATIVE
Protein, ur: 30 mg/dL — AB
RBC / HPF: >50 RBC/hpf (ref 0–5)
Specific Gravity, Urine: 1.02 (ref 1.005–1.030)
pH: 6.5 (ref 5.0–8.0)

## 2023-03-22 MED ORDER — TAMSULOSIN HCL 0.4 MG PO CAPS: 0.4000 mg | ORAL_CAPSULE | Freq: Every day | ORAL | 0 refills | Status: DC

## 2023-03-22 MED ORDER — ONDANSETRON 4 MG PO TBDP: 4.0000 mg | ORAL_TABLET | Freq: Three times a day (TID) | ORAL | 0 refills | Status: DC | PRN

## 2023-03-22 MED ORDER — OXYCODONE-ACETAMINOPHEN 5-325 MG PO TABS
1.0000 | ORAL_TABLET | Freq: Four times a day (QID) | ORAL | 0 refills | Status: DC | PRN
Start: 2023-03-22 — End: 2023-03-25

## 2023-03-22 MED ORDER — KETOROLAC TROMETHAMINE 15 MG/ML IJ SOLN
15.0000 mg | Freq: Once | INTRAMUSCULAR | Status: AC
  Administered 2023-03-22: 15 mg via INTRAVENOUS
  Filled 2023-03-22: qty 1

## 2023-03-22 MED ORDER — MORPHINE SULFATE (PF) 4 MG/ML IV SOLN
4.0000 mg | Freq: Once | INTRAVENOUS | Status: AC
  Administered 2023-03-22: 4 mg via INTRAVENOUS
  Filled 2023-03-22: qty 1

## 2023-03-22 MED ORDER — ONDANSETRON HCL 4 MG/2ML IJ SOLN
4.0000 mg | Freq: Once | INTRAMUSCULAR | Status: AC
  Administered 2023-03-22: 4 mg via INTRAVENOUS
  Filled 2023-03-22: qty 2

## 2023-03-22 MED ORDER — SODIUM CHLORIDE 0.9 % IV BOLUS
1000.0000 mL | Freq: Once | INTRAVENOUS | Status: AC
  Administered 2023-03-22: 1000 mL via INTRAVENOUS

## 2023-03-22 NOTE — Discharge Instructions (Signed)
You were seen today for flank pain.  You were found to have a kidney stone.  Take medications as prescribed.  Make sure that you are drinking plenty of water.  Follow-up with urology if you have ongoing symptoms.  Your kidney stone is approximately 4 mm and should pass on its own.

## 2023-03-22 NOTE — ED Notes (Signed)
Patient transported to CT 

## 2023-03-22 NOTE — ED Provider Notes (Signed)
[place] EMERGENCY DEPARTMENT AT [facility] Provider Note   CSN: [id number] Arrival date & time: 03/22/23  0350     History  Chief Complaint  Patient presents with   Flank Pain    [name] is a 77 y.o. female.  HPI     This is a 77 year old female who presents with left-sided flank pain.  Reports onset of symptoms last night around 11 PM.  Fairly acute in onset.  Started in the left flank and radiates into the left abdomen.  Reports dry heaves.  Has noted some blood in her urine.  Has a remote history of kidney stones in the past requiring lithotripsy.  Also has a history of bladder cancer.  Has not had any fevers.  No dysuria.  Home Medications Prior to Admission medications   Medication Sig Start Date End Date Taking? Authorizing Provider  ondansetron (ZOFRAN-ODT) 4 MG disintegrating tablet Take 1 tablet (4 mg total) by mouth every 8 (eight) hours as needed. 03/22/23  Yes Maude Hettich, Mayer Masker, MD  oxyCODONE-acetaminophen (PERCOCET/ROXICET) 5-325 MG tablet Take 1 tablet by mouth every 6 (six) hours as needed for severe pain. 03/22/23  Yes Tekela Garguilo, Mayer Masker, MD  tamsulosin (FLOMAX) 0.4 MG CAPS capsule Take 1 capsule (0.4 mg total) by mouth daily. 03/22/23  Yes Kinsler Soeder, Mayer Masker, MD  albuterol (PROVENTIL) (2.5 MG/3ML) 0.083% nebulizer solution Take 3 mLs (2.5 mg total) by nebulization every 6 (six) hours as needed for wheezing or shortness of breath. 11/16/22   Cobb, Ruby Cola, NP  albuterol (VENTOLIN HFA) 108 (90 Base) MCG/ACT inhaler Inhale 2 puffs into the lungs every 6 (six) hours as needed for wheezing or shortness of breath. 10/30/22   Cobb, Ruby Cola, NP  Ascorbic Acid (VITAMIN C) 1000 MG tablet Take 1,000 mg by mouth daily.    [provider]  Calcium Carbonate (CALCIUM 600 PO) Take 600 mg by mouth daily in the afternoon.    [provider]  FLUoxetine (PROZAC) 20 MG capsule TAKE 1 CAPSULE BY MOUTH EVERY DAY 02/15/23   Burchette, Elberta Fortis,  MD  fluticasone (FLONASE) 50 MCG/ACT nasal spray Place 1 spray into both nostrils daily. 10/30/22   Cobb, Ruby Cola, NP  Fluticasone-Umeclidin-Vilant (TRELEGY ELLIPTA) 100-62.5-25 MCG/ACT AEPB Inhale 1 puff into the lungs daily. 01/01/23   Cobb, Ruby Cola, NP  Fluticasone-Umeclidin-Vilant (TRELEGY ELLIPTA) 100-62.5-25 MCG/ACT AEPB Inhale 1 puff into the lungs daily. 01/01/23   Cobb, Ruby Cola, NP  guaiFENesin (MUCINEX) 600 MG 12 hr tablet Take 1 tablet (600 mg total) by mouth 2 (two) times daily as needed for to loosen phlegm. 07/27/22   Meuth, Brooke A, PA-C  VITAMIN D PO Take by mouth.    [provider]      Allergies    Aspirin    Review of Systems   Review of Systems  Constitutional:  Negative for fever.  Respiratory:  Negative for shortness of breath.   Cardiovascular:  Negative for chest pain.  Genitourinary:  Positive for flank pain and hematuria. Negative for dysuria.  All other systems reviewed and are negative.   Physical Exam Updated Vital Signs BP (!) 145/85   Pulse 63   Temp 97.6 F (36.4 C)   Resp 17   Ht 1.702 m (5\' 7" )   Wt 73.9 kg   SpO2 93%   BMI 25.53 kg/m  Physical Exam Vitals and nursing note reviewed.  Constitutional:      Appearance: She is well-developed. She is not  ill-appearing.  HENT:     Head: Normocephalic and atraumatic.  Eyes:     Pupils: Pupils are equal, round, and reactive to light.  Cardiovascular:     Rate and Rhythm: Normal rate and regular rhythm.     Heart sounds: Normal heart sounds.  Pulmonary:     Effort: Pulmonary effort is normal. No respiratory distress.     Breath sounds: No wheezing.  Abdominal:     Palpations: Abdomen is soft.     Tenderness: There is no abdominal tenderness. There is no right CVA tenderness or left CVA tenderness.  Musculoskeletal:     Cervical back: Neck supple.  Skin:    General: Skin is warm and dry.  Neurological:     Mental Status: She is alert and oriented to person, place, and  time.  Psychiatric:        Mood and Affect: Mood normal.     ED Results / Procedures / Treatments   Labs (all labs ordered are listed, but only abnormal results are displayed) Labs Reviewed  BASIC METABOLIC PANEL - Abnormal; Notable for the following components:      Result Value   CO2 21 (*)    Glucose, Bld 167 (*)    All other components within normal limits  URINALYSIS, ROUTINE W REFLEX MICROSCOPIC - Abnormal; Notable for the following components:   Color, Urine ORANGE (*)    APPearance HAZY (*)    Glucose, UA 100 (*)    Hgb urine dipstick LARGE (*)    Protein, ur 30 (*)    Leukocytes,Ua TRACE (*)    All other components within normal limits  CBC WITH DIFFERENTIAL/PLATELET    EKG None  Radiology CT Renal Stone Study  Result Date: 03/22/2023 CLINICAL DATA:  Abdominal/flank pain with stone suspected EXAM: CT ABDOMEN AND PELVIS WITHOUT CONTRAST TECHNIQUE: Multidetector CT imaging of the abdomen and pelvis was performed following the standard protocol without IV contrast. RADIATION DOSE REDUCTION: This exam was performed according to the departmental dose-optimization program which includes automated exposure control, adjustment of the mA and/or kV according to patient size and/or use of iterative reconstruction technique. COMPARISON:  07/15/2022 abdominal CT FINDINGS: Lower chest:  No acute finding Hepatobiliary: No focal liver abnormality.Cholecystectomy with small gallbladder remnant. Pancreas: Unremarkable. Spleen: Unremarkable. Adrenals/Urinary Tract: Negative adrenals. Mild left hydroureteronephrosis with renal expansion and perinephric stranding due to a 4 mm stone in the distal ureter. Pelvic phleboliths including below the left UVJ. Unremarkable bladder. Stomach/Bowel: No obstruction. No appendicitis. Distal colonic diverticulosis. Vascular/Lymphatic: No acute vascular abnormality. No mass or adenopathy. Reproductive:No pathologic findings. Other: No ascites or  pneumoperitoneum. Small fatty umbilical hernia. Musculoskeletal: No acute abnormalities. Generalized spinal degeneration. 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 IMPRESSION: 1. Mild left hydroureteronephrosis due to a 4 mm distal ureteral calculus. 2. Chronic findings are stable and described above. Electronically Signed   By: Tiburcio Pea M.D.   On: 03/22/2023 04:25    Procedures Procedures    Medications Ordered in ED Medications  ketorolac (TORADOL) 15 MG/ML injection 15 mg (has no administration in time range)  morphine (PF) 4 MG/ML injection 4 mg (4 mg Intravenous Given 03/22/23 0424)  ondansetron (ZOFRAN) injection 4 mg (4 mg Intravenous Given 03/22/23 0424)  sodium chloride 0.9 % bolus 1,000 mL (0 mLs Intravenous Stopped 03/22/23 0526)    ED Course/ Medical Decision Making/ A&P  Medical Decision Making Amount and/or Complexity of Data Reviewed Labs: ordered. Radiology: ordered.  Risk Prescription drug management.   This patient presents to the ED for concern of flank pain, this involves an extensive number of treatment options, and is a complaint that carries with it a high risk of complications and morbidity.  I considered the following differential and admission for this acute, potentially life threatening condition.  The differential diagnosis includes kidney stones, UTI, pyelonephritis, intra-abdominal pathology  MDM:    This is a 76 year old female who presents with fairly acute onset of left flank pain.  She is nontoxic and vital signs are reassuring.  Pain is most consistent with likely kidney stone especially given hematuria.  Patient was given pain and nausea medication as well as fluids.  Urinalysis shows blood in the urine but no evidence of UTI.  Basic lab work including kidney functions are reassuring.  CT stone study shows 4 mm obstructing stone on the left.   On recheck, patient is comfortable.  She was given a small dose of Toradol.  We discussed supportive measures including pain medication and urology follow-up.  (Labs, imaging, consults)  Labs: I Ordered, and personally interpreted labs.  The pertinent results include: CBC, BMP, urinalysis  Imaging Studies ordered: I ordered imaging studies including CT stone study I independently visualized and interpreted imaging. I agree with the radiologist interpretation  Additional history obtained from chart review.  External records from outside source obtained and reviewed including prior evaluations  Cardiac Monitoring: The patient was maintained on a cardiac monitor.  If on the cardiac monitor, I personally viewed and interpreted the cardiac monitored which showed an underlying rhythm of: Sinus rhythm  Reevaluation: After the interventions noted above, I reevaluated the patient and found that they have :improved  Social Determinants of Health:  lives independently  Disposition: Discharge  Co morbidities that complicate the patient evaluation  Past Medical History:  Diagnosis Date   Anxiety    aniety attacks   Bladder tumor    Colon polyps    COPD (chronic obstructive pulmonary disease) (HCC)    09-08-2020 acute flare up with cough finished antibiotics and steroids cough resolved now   Depression    Hay fever    allergies   History of kidney stones    Migraines    not in 40 years   Wears glasses      Medicines Meds ordered this encounter  Medications   morphine (PF) 4 MG/ML injection 4 mg   ondansetron (ZOFRAN) injection 4 mg   sodium chloride 0.9 % bolus 1,000 mL   ketorolac (TORADOL) 15 MG/ML injection 15 mg   tamsulosin (FLOMAX) 0.4 MG CAPS capsule    Sig: Take 1 capsule (0.4 mg total) by mouth daily.    Dispense:  30 capsule    Refill:  0   oxyCODONE-acetaminophen (PERCOCET/ROXICET) 5-325 MG tablet    Sig: Take 1 tablet by mouth every 6 (six) hours as needed for  severe pain.    Dispense:  10 tablet    Refill:  0   ondansetron (ZOFRAN-ODT) 4 MG disintegrating tablet    Sig: Take 1 tablet (4 mg total) by mouth every 8 (eight) hours as needed.    Dispense:  20 tablet    Refill:  0    I have reviewed the patients home medicines and have made adjustments as needed  Problem List / ED Course: Problem List Items Addressed This Visit   None Visit Diagnoses  Kidney stone    -  Primary   Relevant Medications   morphine (PF) 4 MG/ML injection 4 mg (Completed)   oxyCODONE-acetaminophen (PERCOCET/ROXICET) 5-325 MG tablet                   Final Clinical Impression(s) / ED Diagnoses Final diagnoses:  Kidney stone    Rx / DC Orders ED Discharge Orders          Ordered    tamsulosin (FLOMAX) 0.4 MG CAPS capsule  Daily        03/22/23 0531    oxyCODONE-acetaminophen (PERCOCET/ROXICET) 5-325 MG tablet  Every 6 hours PRN        03/22/23 0531    ondansetron (ZOFRAN-ODT) 4 MG disintegrating tablet  Every 8 hours PRN        03/22/23 0531              Shon Baton, MD 03/22/23 [id number]

## 2023-03-22 NOTE — ED Triage Notes (Signed)
POV from home, A&O x 4, GCS 15, amb to room  Pt c/o left sided flank pain that radiates to front, dry heaves and blood in urine since last night.

## 2023-03-25 ENCOUNTER — Emergency Department (HOSPITAL_BASED_OUTPATIENT_CLINIC_OR_DEPARTMENT_OTHER): Payer: Medicare HMO

## 2023-03-25 ENCOUNTER — Other Ambulatory Visit: Payer: Self-pay

## 2023-03-25 ENCOUNTER — Encounter (HOSPITAL_BASED_OUTPATIENT_CLINIC_OR_DEPARTMENT_OTHER): Payer: Self-pay

## 2023-03-25 ENCOUNTER — Emergency Department (HOSPITAL_BASED_OUTPATIENT_CLINIC_OR_DEPARTMENT_OTHER)
Admission: EM | Admit: 2023-03-25 | Discharge: 2023-03-25 | Disposition: A | Discharge status: Home / Self Care | Payer: Medicare HMO | Attending: Emergency Medicine | Admitting: Emergency Medicine

## 2023-03-25 DIAGNOSIS — R944 Abnormal results of kidney function studies: Secondary | ICD-10-CM | POA: Diagnosis not present

## 2023-03-25 DIAGNOSIS — K429 Umbilical hernia without obstruction or gangrene: Secondary | ICD-10-CM | POA: Diagnosis not present

## 2023-03-25 DIAGNOSIS — K449 Diaphragmatic hernia without obstruction or gangrene: Secondary | ICD-10-CM | POA: Diagnosis not present

## 2023-03-25 DIAGNOSIS — N200 Calculus of kidney: Secondary | ICD-10-CM | POA: Diagnosis not present

## 2023-03-25 DIAGNOSIS — N132 Hydronephrosis with renal and ureteral calculous obstruction: Secondary | ICD-10-CM | POA: Insufficient documentation

## 2023-03-25 DIAGNOSIS — R109 Unspecified abdominal pain: Secondary | ICD-10-CM | POA: Diagnosis not present

## 2023-03-25 DIAGNOSIS — Z87891 Personal history of nicotine dependence: Secondary | ICD-10-CM | POA: Insufficient documentation

## 2023-03-25 DIAGNOSIS — J449 Chronic obstructive pulmonary disease, unspecified: Secondary | ICD-10-CM | POA: Diagnosis not present

## 2023-03-25 DIAGNOSIS — K5790 Diverticulosis of intestine, part unspecified, without perforation or abscess without bleeding: Secondary | ICD-10-CM | POA: Diagnosis not present

## 2023-03-25 LAB — COMPREHENSIVE METABOLIC PANEL
ALT: 9 U/L (ref 0–44)
AST: 12 U/L — ABNORMAL LOW (ref 15–41)
Albumin: 3.8 g/dL (ref 3.5–5.0)
Alkaline Phosphatase: 68 U/L (ref 38–126)
Anion gap: 8 (ref 5–15)
BUN: 20 mg/dL (ref 8–23)
CO2: 24 mmol/L (ref 22–32)
Calcium: 8.7 mg/dL — ABNORMAL LOW (ref 8.9–10.3)
Chloride: 103 mmol/L (ref 98–111)
Creatinine, Ser: 1.42 mg/dL — ABNORMAL HIGH (ref 0.44–1.00)
GFR, Estimated: 38 mL/min — ABNORMAL LOW (ref >60)
Glucose, Bld: 155 mg/dL — ABNORMAL HIGH (ref 70–99)
Potassium: 3.6 mmol/L (ref 3.5–5.1)
Sodium: 135 mmol/L (ref 135–145)
Total Bilirubin: 0.5 mg/dL (ref 0.3–1.2)
Total Protein: 6.6 g/dL (ref 6.5–8.1)

## 2023-03-25 LAB — CBC
HCT: 40 % (ref 36.0–46.0)
Hemoglobin: 13.2 g/dL (ref 12.0–15.0)
MCH: 31.2 pg (ref 26.0–34.0)
MCHC: 33 g/dL (ref 30.0–36.0)
MCV: 94.6 fL (ref 80.0–100.0)
Platelets: 244 10*3/uL (ref 150–400)
RBC: 4.23 MIL/uL (ref 3.87–5.11)
RDW: 12.6 % (ref 11.5–15.5)
WBC: 11.7 10*3/uL — ABNORMAL HIGH (ref 4.0–10.5)
nRBC: 0 % (ref 0.0–0.2)

## 2023-03-25 LAB — URINALYSIS, ROUTINE W REFLEX MICROSCOPIC
Bacteria, UA: NONE SEEN
Bilirubin Urine: NEGATIVE
Glucose, UA: NEGATIVE mg/dL
Ketones, ur: NEGATIVE mg/dL
Nitrite: NEGATIVE
Protein, ur: 30 mg/dL — AB
RBC / HPF: >50 RBC/hpf (ref 0–5)
Specific Gravity, Urine: 1.03 (ref 1.005–1.030)
pH: 5.5 (ref 5.0–8.0)

## 2023-03-25 MED ORDER — ONDANSETRON HCL 4 MG/2ML IJ SOLN
4.0000 mg | Freq: Once | INTRAMUSCULAR | Status: AC
  Administered 2023-03-25: 4 mg via INTRAVENOUS
  Filled 2023-03-25: qty 2

## 2023-03-25 MED ORDER — TRAMADOL HCL 50 MG PO TABS
50.0000 mg | ORAL_TABLET | Freq: Four times a day (QID) | ORAL | 0 refills | Status: DC | PRN
Start: 2023-03-25 — End: 2023-05-08

## 2023-03-25 MED ORDER — SODIUM CHLORIDE 0.9 % IV BOLUS
1000.0000 mL | Freq: Once | INTRAVENOUS | Status: AC
  Administered 2023-03-25: 1000 mL via INTRAVENOUS

## 2023-03-25 MED ORDER — HYDROMORPHONE HCL 1 MG/ML IJ SOLN
1.0000 mg | Freq: Once | INTRAMUSCULAR | Status: AC
  Administered 2023-03-25: 1 mg via INTRAVENOUS
  Filled 2023-03-25: qty 1

## 2023-03-25 NOTE — ED Provider Notes (Signed)
[facility] Emergency Department Provider Note MRN:  [id number]  Arrival date & time: 03/25/23     Chief Complaint   Flank Pain   History of Present Illness   [name] is a 77 y.o. year-old female with a history of COPD presenting to the ED with chief complaint of flank pain.  Left flank pain for the past several days, recently diagnosed with a kidney stone.  Was told to come back to the emergency department if anything change.  Worsened pain this evening, not responding to home Percocet.  No fever.  Review of Systems  A thorough review of systems was obtained and all systems are negative except as noted in the HPI and PMH.   Patient's Health History    Past Medical History:  Diagnosis Date   Anxiety    aniety attacks   Bladder tumor    Colon polyps    COPD (chronic obstructive pulmonary disease) (HCC)    09-08-2020 acute flare up with cough finished antibiotics and steroids cough resolved now   Depression    Hay fever    allergies   History of kidney stones    Migraines    not in 40 years   Wears glasses     Past Surgical History:  Procedure Laterality Date   BILIARY STENT PLACEMENT  07/25/2022   Procedure: BILIARY STENT PLACEMENT;  Surgeon: Jeani Hawking, MD;  Location: [facility] ENDOSCOPY;  Service: Gastroenterology;;   BREAST BIOPSY  1990   caesarean secion  1974, 1977, 1984   CESAREAN SECTION  [phone]   CHOLECYSTECTOMY N/A 07/17/2022   Procedure: LAPAROSCOPIC SUBTOTAL CHOLECYSTECTOMY;  Surgeon: Griselda Miner, MD;  Location: [facility] OR;  Service: General;  Laterality: N/A;   COLONOSCOPY W/ POLYPECTOMY  last done 2016    x 3    CYSTOSCOPY N/A 09/21/2020   Procedure: CYSTOSCOPY;  Surgeon: Noel Christmas, MD;  Location: [facility];  Service: Urology;  Laterality: N/A;   CYSTOSCOPY W/ RETROGRADES Bilateral 02/04/2020   Procedure: CYSTOSCOPY WITH RETROGRADE PYELOGRAM;  Surgeon: Crista Elliot, MD;  [facility];  Service: Urology;  Laterality: Bilateral;   ENDOSCOPIC RETROGRADE CHOLANGIOPANCREATOGRAPHY (ERCP) WITH PROPOFOL N/A 02/02/2023   Procedure: ENDOSCOPIC RETROGRADE CHOLANGIOPANCREATOGRAPHY (ERCP) WITH PROPOFOL;  Surgeon: Jeani Hawking, MD;  Location: [facility] ENDOSCOPY;  Service: Gastroenterology;  Laterality: N/A;   ERCP N/A 07/22/2022   Procedure: ENDOSCOPIC RETROGRADE CHOLANGIOPANCREATOGRAPHY (ERCP);  Surgeon: Vida Rigger, MD;  Location: [facility] ENDOSCOPY;  Service: Gastroenterology;  Laterality: N/A;   ERCP N/A 07/25/2022   Procedure: ENDOSCOPIC RETROGRADE CHOLANGIOPANCREATOGRAPHY (ERCP);  Surgeon: Jeani Hawking, MD;  Location: [facility] ENDOSCOPY;  Service: Gastroenterology;  Laterality: N/A;   FINGER FRACTURE SURGERY Right 07/11/2022   5th digit has a screw   OPEN REDUCTION INTERNAL FIXATION (ORIF) DISTAL RADIAL FRACTURE Right 11/03/2014   Procedure: OPEN REDUCTION INTERNAL FIXATION (ORIF) RIGHT DISTAL RADIAL FRACTURE WITH REPAIR AND RECONSTRUCTION;  Surgeon: Dominica Severin, MD;  Location: [facility] OR;  Service: Orthopedics;  Laterality: Right;   PANCREATIC STENT PLACEMENT  07/22/2022   Procedure: PANCREATIC STENT PLACEMENT;  Surgeon: Vida Rigger, MD;  Location: [facility] ENDOSCOPY;  Service: Gastroenterology;;   Dennison Mascot  07/22/2022   Procedure: Dennison Mascot;  Surgeon: Vida Rigger, MD;  Location: [facility] ENDOSCOPY;  Service: Gastroenterology;;   Francine Graven REMOVAL  02/02/2023   Procedure: STENT REMOVAL;  Surgeon: Jeani Hawking, MD;  Location: [facility] ENDOSCOPY;  Service: Gastroenterology;;   TRANSURETHRAL RESECTION OF BLADDER TUMOR WITH MITOMYCIN-C N/A 02/04/2020  Procedure: TRANSURETHRAL RESECTION OF BLADDER TUMOR WITH POST OPERATIVE GEMCITABINE;  Surgeon: Crista Elliot, MD;  Location: [facility];  Service: Urology;  Laterality: N/A;   TRANSURETHRAL RESECTION OF BLADDER TUMOR WITH MITOMYCIN-C N/A 09/21/2020   Procedure: CYSTOSCOPY WITH BLADDER FULGARATION WITH POST OP GEMCITABINE;  Surgeon: Noel Christmas, MD;  Location: [facility];  Service: Urology;  Laterality: N/A;  1 HR    Family History  Problem Relation Age of Onset   Arthritis Other    Stroke Other    Hypertension Other    Depression Other    Migraines Other    Colon polyps Other    Breast cancer Neg Hx     Social History   Socioeconomic History   Marital status: Married    Spouse name: Not on file   Number of children: 3   Years of education: 13   Highest education level: Some college, no degree  Occupational History    Comment: Retired   Tobacco Use   Smoking status: Former    Current packs/day: 0.00    Average packs/day: 1.5 packs/day for 50.0 years (75.0 ttl pk-yrs)    Types: Cigarettes    Start date: 63    Quit date: 2012    Years since quitting: 12.7   Smokeless tobacco: Never   Tobacco comments:    Former smoker  Advertising account planner   Vaping status: Never Used  Substance and Sexual Activity   Alcohol use: Yes    Alcohol/week: 5.0 standard drinks of alcohol    Types: 5 Glasses of wine per week   Drug use: No   Sexual activity: Not on file  Other Topics Concern   Not on file  Social History Narrative   Married   3 children   Social Determinants of Health   Financial Resource Strain: Low Risk  (08/09/2022)   Overall Financial Resource Strain (CARDIA)    Difficulty of Paying Living Expenses: Not hard at all  Food Insecurity: No Food Insecurity (08/09/2022)   Hunger Vital Sign    Worried About Running Out of Food in the Last Year: Never true    Ran Out of Food in the Last Year: Never true  Transportation Needs: No Transportation Needs (08/09/2022)   PRAPARE - Administrator, Civil Service (Medical): No    Lack of Transportation (Non-Medical): No  Physical Activity: Inactive (08/09/2022)   Exercise Vital Sign    Days of Exercise per Week: 0 days    Minutes of Exercise per Session: 0 min  Stress: No Stress Concern Present (08/09/2022)   [employer] of  Occupational Health - Occupational Stress Questionnaire    Feeling of Stress : Not at all  Social Connections: Moderately Isolated (08/09/2022)   Social Connection and Isolation Panel [NHANES]    Frequency of Communication with Friends and Family: More than three times a week    Frequency of Social Gatherings with Friends and Family: More than three times a week    Attends Religious Services: Never    Database administrator or Organizations: No    Attends Banker Meetings: Never    Marital Status: Married  Catering manager Violence: Not At Risk (08/09/2022)   Humiliation, Afraid, Rape, and Kick questionnaire    Fear of Current or Ex-Partner: No    Emotionally Abused: No    Physically Abused: No    Sexually Abused: No     Physical Exam  Vitals:   03/25/23 0528 03/25/23 0530  BP:  137/75  Pulse:  75  Resp:  12  Temp: 98.7 F (37.1 C)   SpO2:  92%    CONSTITUTIONAL: Well-appearing, NAD NEURO/PSYCH:  Alert and oriented x 3, no focal deficits EYES:  eyes equal and reactive ENT/NECK:  no LAD, no JVD CARDIO: Regula rate, well-perfused, normal S1 and S2 PULM:  CTAB no wheezing or rhonchi GI/GU:  non-distended, non-tender MSK/SPINE:  No gross deformities, no edema SKIN:  no rash, atraumatic   *Additional and/or pertinent findings included in MDM below  Diagnostic and Interventional Summary    EKG Interpretation Date/Time:    Ventricular Rate:    PR Interval:    QRS Duration:    QT Interval:    QTC Calculation:   R Axis:      Text Interpretation:         Labs Reviewed  COMPREHENSIVE METABOLIC PANEL - Abnormal; Notable for the following components:      Result Value   Glucose, Bld 155 (*)    Creatinine, Ser 1.42 (*)    Calcium 8.7 (*)    AST 12 (*)    GFR, Estimated 38 (*)    All other components within normal limits  CBC - Abnormal; Notable for the following components:   WBC 11.7 (*)    All other components within normal limits  URINALYSIS,  ROUTINE W REFLEX MICROSCOPIC - Abnormal; Notable for the following components:   Hgb urine dipstick LARGE (*)    Protein, ur 30 (*)    Leukocytes,Ua SMALL (*)    All other components within normal limits    CT Renal Stone Study  Final Result      Medications  sodium chloride 0.9 % bolus 1,000 mL (1,000 mLs Intravenous New Bag/Given 03/25/23 0437)  ondansetron (ZOFRAN) injection 4 mg (4 mg Intravenous Given 03/25/23 0436)  HYDROmorphone (DILAUDID) injection 1 mg (1 mg Intravenous Given 03/25/23 0436)     Procedures  /  Critical Care Procedures  ED Course and Medical Decision Making  Initial Impression and Ddx Suspect continued pain related to kidney stone.  Abdomen soft nontender, vitals reassuring, will need symptom control, will obtain labs and CT to evaluate for any complications.  Past medical/surgical history that increases complexity of ED encounter: COPD  Interpretation of Diagnostics I personally reviewed the laboratory assessment and my interpretation is as follows: No significant blood count or electrolyte disturbance, mild elevation in creatinine  CT confirms 4 mm stone, slightly more distal in the ureter, hydronephrosis.  Patient Reassessment and Ultimate Disposition/Management     Patient feeling a lot better, appropriate for discharge.  Patient management required discussion with the following services or consulting groups:  None  Complexity of Problems Addressed Acute illness or injury that poses threat of life of bodily function  Additional Data Reviewed and Analyzed Further history obtained from: Further history from spouse/family member  Additional Factors Impacting ED Encounter Risk Prescriptions  Elmer Sow. Pilar Plate, MD [facility] Emergency Medicine [facility] [email or web address]  Final Clinical Impressions(s) / ED Diagnoses     ICD-10-CM   1. Kidney stone  N20.0       ED Discharge Orders          Ordered    traMADol (ULTRAM)  50 MG tablet  Every 6 hours PRN        03/25/23 0559             Discharge Instructions Discussed with  and Provided to Patient:    Discharge Instructions      You were evaluated in the Emergency Department and after careful evaluation, we did not find any emergent condition requiring admission or further testing in the hospital.  Your exam/testing today was overall reassuring.  Symptoms seem consistent with pain related to kidney stone.  Recommend Tylenol 1000 mg every 4-6 hours at home.  The Percocet did not seem to be working at home.  Instead use the tramadol medication as needed for more significant pain.  Continue the Zofran as needed for nausea, continue the tamsulosin daily to help you pass the stone.  Follow-up with [facility].  Please return to the Emergency Department if you experience any worsening of your condition.  Thank you for allowing [place] to be a part of your care.       Sabas Sous, MD 03/25/23 [id number]

## 2023-03-25 NOTE — ED Notes (Signed)
Reviewed AVS with patient, patient expressed understanding of directions, denies further questions at this time. 

## 2023-03-25 NOTE — Discharge Instructions (Addendum)
You were evaluated in the Emergency Department and after careful evaluation, we did not find any emergent condition requiring admission or further testing in the hospital.  Your exam/testing today was overall reassuring.  Symptoms seem consistent with pain related to kidney stone.  Recommend Tylenol 1000 mg every 4-6 hours at home.  The Percocet did not seem to be working at home.  Instead use the tramadol medication as needed for more significant pain.  Continue the Zofran as needed for nausea, continue the tamsulosin daily to help you pass the stone.  Follow-up with [facility].  Please return to the Emergency Department if you experience any worsening of your condition.  Thank you for allowing [place] to be a part of your care.

## 2023-03-25 NOTE — ED Triage Notes (Signed)
Pt states that she was seen here x 5 days ago for left flank pain and diagnosed with 4mm kidney stone. Pain has progressively worsened and she has also had some nausea/vomiting, despite taking prescribed medications

## 2023-03-26 ENCOUNTER — Encounter (HOSPITAL_COMMUNITY): Payer: Self-pay | Admitting: Urology

## 2023-03-26 ENCOUNTER — Inpatient Hospital Stay (HOSPITAL_COMMUNITY): Payer: Medicare HMO

## 2023-03-26 ENCOUNTER — Ambulatory Visit (HOSPITAL_COMMUNITY)
Admission: AD | Admit: 2023-03-26 | Discharge: 2023-03-26 | Disposition: A | Discharge status: Home / Self Care | Payer: Medicare HMO | Source: Ambulatory Visit | Attending: Urology | Admitting: Urology

## 2023-03-26 ENCOUNTER — Inpatient Hospital Stay (HOSPITAL_COMMUNITY): Payer: Medicare HMO | Admitting: Anesthesiology

## 2023-03-26 ENCOUNTER — Encounter (HOSPITAL_COMMUNITY)
Admission: AD | Disposition: A | Discharge status: Home / Self Care | Payer: Self-pay | Source: Ambulatory Visit | Attending: Urology

## 2023-03-26 ENCOUNTER — Inpatient Hospital Stay (HOSPITAL_BASED_OUTPATIENT_CLINIC_OR_DEPARTMENT_OTHER): Payer: Medicare HMO | Admitting: Anesthesiology

## 2023-03-26 DIAGNOSIS — N201 Calculus of ureter: Secondary | ICD-10-CM

## 2023-03-26 DIAGNOSIS — J449 Chronic obstructive pulmonary disease, unspecified: Secondary | ICD-10-CM | POA: Diagnosis not present

## 2023-03-26 DIAGNOSIS — I4891 Unspecified atrial fibrillation: Secondary | ICD-10-CM | POA: Insufficient documentation

## 2023-03-26 DIAGNOSIS — Z8616 Personal history of COVID-19: Secondary | ICD-10-CM | POA: Insufficient documentation

## 2023-03-26 DIAGNOSIS — Z8551 Personal history of malignant neoplasm of bladder: Secondary | ICD-10-CM | POA: Insufficient documentation

## 2023-03-26 DIAGNOSIS — N132 Hydronephrosis with renal and ureteral calculous obstruction: Secondary | ICD-10-CM | POA: Insufficient documentation

## 2023-03-26 DIAGNOSIS — Z87891 Personal history of nicotine dependence: Secondary | ICD-10-CM | POA: Insufficient documentation

## 2023-03-26 HISTORY — PX: CYSTOSCOPY WITH RETROGRADE PYELOGRAM, URETEROSCOPY AND STENT PLACEMENT: SHX5789

## 2023-03-26 SURGERY — CYSTOURETEROSCOPY, WITH RETROGRADE PYELOGRAM AND STENT INSERTION
Anesthesia: General | Laterality: Left

## 2023-03-26 MED ORDER — LIDOCAINE HCL (CARDIAC) PF 100 MG/5ML IV SOSY
PREFILLED_SYRINGE | INTRAVENOUS | Status: DC | PRN
  Administered 2023-03-26: 60 mg via INTRAVENOUS

## 2023-03-26 MED ORDER — FENTANYL CITRATE PF 50 MCG/ML IJ SOSY: 25.0000 ug | PREFILLED_SYRINGE | INTRAMUSCULAR | Status: DC | PRN

## 2023-03-26 MED ORDER — FENTANYL CITRATE (PF) 100 MCG/2ML IJ SOLN
INTRAMUSCULAR | Status: DC | PRN
  Administered 2023-03-26 (×4): 25 ug via INTRAVENOUS

## 2023-03-26 MED ORDER — CEFAZOLIN SODIUM-DEXTROSE 2-3 GM-%(50ML) IV SOLR
INTRAVENOUS | Status: DC | PRN
Start: 2023-03-26 — End: 2023-03-26
  Administered 2023-03-26: 2 g via INTRAVENOUS

## 2023-03-26 MED ORDER — CHLORHEXIDINE GLUCONATE 0.12 % MT SOLN
15.0000 mL | Freq: Once | OROMUCOSAL | Status: AC
  Administered 2023-03-26: 15 mL via OROMUCOSAL

## 2023-03-26 MED ORDER — PROPOFOL 10 MG/ML IV BOLUS
INTRAVENOUS | Status: DC | PRN
  Administered 2023-03-26: 130 mg via INTRAVENOUS

## 2023-03-26 MED ORDER — LACTATED RINGERS IV SOLN: INTRAVENOUS | Status: DC

## 2023-03-26 MED ORDER — FENTANYL CITRATE (PF) 100 MCG/2ML IJ SOLN
INTRAMUSCULAR | Status: AC
  Filled 2023-03-26: qty 2

## 2023-03-26 MED ORDER — PHENYLEPHRINE 80 MCG/ML (10ML) SYRINGE FOR IV PUSH (FOR BLOOD PRESSURE SUPPORT)
PREFILLED_SYRINGE | INTRAVENOUS | Status: DC | PRN
Start: 2023-03-26 — End: 2023-03-26
  Administered 2023-03-26: 80 ug via INTRAVENOUS
  Administered 2023-03-26 (×2): 160 ug via INTRAVENOUS

## 2023-03-26 MED ORDER — CEFAZOLIN SODIUM-DEXTROSE 2-4 GM/100ML-% IV SOLN
INTRAVENOUS | Status: AC
  Filled 2023-03-26: qty 100

## 2023-03-26 MED ORDER — ACETAMINOPHEN 10 MG/ML IV SOLN
1000.0000 mg | Freq: Once | INTRAVENOUS | Status: AC
  Administered 2023-03-26: 1000 mg via INTRAVENOUS
  Filled 2023-03-26: qty 100

## 2023-03-26 MED ORDER — SODIUM CHLORIDE 0.9 % IR SOLN
Status: DC | PRN
  Administered 2023-03-26: 1000 mL via INTRAVESICAL

## 2023-03-26 MED ORDER — LIDOCAINE HCL URETHRAL/MUCOSAL 2 % EX GEL
CUTANEOUS | Status: AC
  Filled 2023-03-26: qty 30

## 2023-03-26 MED ORDER — ONDANSETRON HCL 4 MG/2ML IJ SOLN
INTRAMUSCULAR | Status: DC | PRN
  Administered 2023-03-26: 4 mg via INTRAVENOUS

## 2023-03-26 MED ORDER — DEXAMETHASONE SODIUM PHOSPHATE 10 MG/ML IJ SOLN
INTRAMUSCULAR | Status: DC | PRN
  Administered 2023-03-26: 4 mg via INTRAVENOUS

## 2023-03-26 SURGICAL SUPPLY — 23 items
BAG URO CATCHER STRL LF (MISCELLANEOUS) ×2 IMPLANT
BASKET ZERO TIP NITINOL 2.4FR (BASKET) IMPLANT
BSKT STON RTRVL ZERO TP 2.4FR (BASKET) ×1
CATH URETERAL DUAL LUMEN 10F (MISCELLANEOUS) IMPLANT
CATH URETL OPEN END 6FR 70 (CATHETERS) IMPLANT
CLOTH BEACON ORANGE TIMEOUT ST (SAFETY) ×2 IMPLANT
FIBER LASER MOSES 200 DFL (Laser) IMPLANT
FIBER LASER MOSES 365 DFL (Laser) IMPLANT
GLOVE BIO SURGEON STRL SZ8.5 (GLOVE) ×2 IMPLANT
GOWN STRL REUS W/ TWL XL LVL3 (GOWN DISPOSABLE) ×2 IMPLANT
GOWN STRL REUS W/TWL XL LVL3 (GOWN DISPOSABLE) ×1
GUIDEWIRE AMPLATZ STIFF 0.35 (WIRE) IMPLANT
GUIDEWIRE STR DUAL SENSOR (WIRE) ×2 IMPLANT
GUIDEWIRE SUPER STIFF (WIRE) IMPLANT
GUIDEWIRE ZIPWRE .038 STRAIGHT (WIRE) IMPLANT
KIT TURNOVER KIT A (KITS) IMPLANT
MANIFOLD NEPTUNE II (INSTRUMENTS) ×2 IMPLANT
PACK CYSTO (CUSTOM PROCEDURE TRAY) ×2 IMPLANT
PAD PREP 24X48 CUFFED NSTRL (MISCELLANEOUS) ×2 IMPLANT
SHEATH NAVIGATOR HD 11/13X28 (SHEATH) IMPLANT
SHEATH NAVIGATOR HD 11/13X36 (SHEATH) IMPLANT
TUBING CONNECTING 10 (TUBING) ×2 IMPLANT
TUBING UROLOGY SET (TUBING) ×2 IMPLANT

## 2023-03-26 NOTE — Anesthesia Procedure Notes (Addendum)
Procedure Name: LMA Insertion Date/Time: 03/26/2023 5:18 PM  Performed by: Maurene Capes, CRNAPre-anesthesia Checklist: Patient identified, Emergency Drugs available, Suction available and Patient being monitored Patient Re-evaluated:Patient Re-evaluated prior to induction Oxygen Delivery Method: Circle System Utilized Preoxygenation: Pre-oxygenation with 100% oxygen Induction Type: IV induction Ventilation: Mask ventilation without difficulty LMA: LMA inserted LMA Size: 4.0 Number of attempts: 1 Placement Confirmation: positive ETCO2 Tube secured with: Tape Dental Injury: Teeth and Oropharynx as per pre-operative assessment

## 2023-03-26 NOTE — Discharge Instructions (Addendum)

## 2023-03-26 NOTE — Op Note (Signed)
Preoperative diagnosis: Left ureteral calculus  Postoperative diagnosis: Left ureteral calculus  Procedure:  Cystoscopy Left ureteroscopy and stone removal  Surgeon: Moody Bruins. M.D.  Anesthesia: General  Complications: None  EBL: Minimal  Specimens: Left ureteral calculus  Disposition of specimens: [facility] for stone analysis  Indication: [name] is a 77 y.o. year old patient with urolithiasis.  She presented with a symptomatic distal left ureteral calculus with uncontrolled pain.  After reviewing the management options for treatment, the patient elected to proceed with the above surgical procedure(s). We have discussed the potential benefits and risks of the procedure, side effects of the proposed treatment, the likelihood of the patient achieving the goals of the procedure, and any potential problems that might occur during the procedure or recuperation. Informed consent has been obtained.  Description of procedure:  The patient was taken to the operating room and general anesthesia was induced.  The patient was placed in the dorsal lithotomy position, prepped and draped in the usual sterile fashion, and preoperative antibiotics were administered. A preoperative time-out was performed.   Cystourethroscopy was performed.  The patient's urethra was examined and was normal. The bladder was then systematically examined in its entirety. There was no evidence for any bladder tumors, stones, or other mucosal pathology.  On inspection of the left ureteral orifice, the stone could be seen crowning.  This was in an area of a prior bladder tumor resection and the inability to pass the stone might have been through the fact that she did have some scarring around her ureteral orifice although it was patent.  A 0.38 sensor guidewire was then advanced up the left ureter into the renal pelvis under fluoroscopic guidance. The 6 Fr semirigid ureteroscope was  then advanced into the ureter next to the guidewire and the calculus was identified.  The stone was able to be removed intact with a 0 tip nitinol basket.  The bladder was then emptied and the procedure ended.  The patient appeared to tolerate the procedure well and without complications.  The patient was able to be awakened and transferred to the recovery unit in satisfactory condition.

## 2023-03-26 NOTE — Anesthesia Preprocedure Evaluation (Addendum)
Anesthesia Evaluation  Patient identified by MRN, date of birth, ID band Patient awake    Reviewed: Allergy & Precautions, NPO status , Patient's Chart, lab work & pertinent test results  Airway Mallampati: III  TM Distance: >3 FB Neck ROM: Full    Dental no notable dental hx. (+) Teeth Intact, Dental Advisory Given   Pulmonary COPD,  COPD inhaler, former smoker   Pulmonary exam normal breath sounds clear to auscultation       Cardiovascular negative cardio ROS Normal cardiovascular exam Rhythm:Regular Rate:Normal     Neuro/Psych  Headaches PSYCHIATRIC DISORDERS Anxiety Depression       GI/Hepatic negative GI ROS, Neg liver ROS,,,  Endo/Other  negative endocrine ROS    Renal/GU Renal InsufficiencyRenal diseaseLab Results      Component                Value               Date                      NA                       135                 03/25/2023                CL                       103                 03/25/2023                K                        3.6                 03/25/2023                CO2                      24                  03/25/2023                BUN                      20                  03/25/2023                CREATININE               1.42 (H)            03/25/2023                GFRNONAA                 38 (L)              03/25/2023                CALCIUM                  8.7 (L)             03/25/2023  ALBUMIN                  3.8                 03/25/2023                GLUCOSE                  155 (H)             03/25/2023             negative genitourinary   Musculoskeletal negative musculoskeletal ROS (+)    Abdominal   Peds  Hematology negative hematology ROS (+)   Anesthesia Other Findings Left ureteral stone  Reproductive/Obstetrics                             Anesthesia Physical Anesthesia Plan  ASA: 3  Anesthesia Plan:  General   Post-op Pain Management: Ofirmev IV (intra-op)*   Induction: Intravenous  PONV Risk Score and Plan: 3 and Ondansetron, Dexamethasone and Treatment may vary due to age or medical condition  Airway Management Planned: LMA  Additional Equipment:   Intra-op Plan:   Post-operative Plan: Extubation in OR  Informed Consent: I have reviewed the patients History and Physical, chart, labs and discussed the procedure including the risks, benefits and alternatives for the proposed anesthesia with the patient or authorized representative who has indicated his/her understanding and acceptance.     Dental advisory given  Plan Discussed with: CRNA  Anesthesia Plan Comments:        Anesthesia Quick Evaluation

## 2023-03-26 NOTE — H&P (Signed)
Office Visit Report     03/26/2023   --------------------------------------------------------------------------------   [name]  MRN: [id number]  DOB: 1946/03/08, 77 year old Female  SSN: [id number]   PRIMARY CARE:  Evelena Peat, MD  PRIMARY CARE FAX:  [phone]  REFERRING:  Roselee Nova D. Arita Miss, MD  PROVIDER:  Kasandra Knudsen, M.D.  TREATING:  Bartholomew Crews, NP  LOCATION:  [facility] 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 She then underwent cystoscopy with fulguration and intravesical gemcitabine    02/03/2022: Surveillance cystoscopy. No hematuria in the interim. Patient's husband is going to have knee surgery scheduled next month. This has been delayed due to A-fib previously.   05/03/2022: Here for surveillance cystoscopy. No gross hematuria. Patient had CT in interim to evaluate upper tracts which did not show any concern.   08/02/22: Here for surveillance cystoscopy. No gross hematuria. In the interim patient has been hospitalized for right arm fracture as well as cholecystitis requiring drain. She is currently on amoxicillin.   11/01/22: Here for surveillance cystoscopy. No gross hematuria. Recently had COVID and now with bronchitis.   03/26/2023: 77 year old female who presents today with severe nausea and vomiting due to a distal left ureteral stone causing hydronephrosis. She has been seen in the emergency department twice in the last week due to pain, nausea and inability to hold down fluids. She is currently on tramadol and ondansetron without relief. Most recent CT scan was done 12 hours ago. Her last oral intake was toast 2 pieces at 9 AM  and a sip of water at 2 PM. She last had pain and nausea medicine at 730 this morning. She continues to dry heave. She denies fevers.     ALLERGIES: Aspirin TABS    MEDICATIONS: Doxycycline Hyclate  Acetaminophen  Amoxicillin  Benzonatate  Chlorpheniramine Maleate 4 mg tablet  Flonase Allergy Relief  FLUoxetine HCl - 20 MG Oral Tablet Oral  Guaifenesin  Multivitamin  Prednisone 5 mg tablet, dose pack     GU PSH: Bladder Instill AntiCA Agent - 2022, 2021, 2021, 2021, 2021, 2021, 2021, 2021 Cystoscopy - 11/01/2022, 08/02/2022, 05/03/2022, 02/03/2022, 10/19/2021, 2023, 04/18/2021, 2022, 2022, 2021, 2021 Cystoscopy Fulguration - 2022 Cystoscopy Insert Stent - 2011 Cystoscopy TURBT <2 cm - 2021 ESWL - 2011 Locm 300-399Mg /Ml Iodine,1Ml - 03/01/2022       PSH Notes: Lithotripsy, Cystoscopy With Insertion Of Ureteral Stent Right, Biopsy Breast Percutaneous Needle Core, Cesarean Section, Wrist 2016   NON-GU PSH: Bx Breast Percut W/o Image - 2011 Cesarean Delivery Only - 2011     GU PMH: Bladder Cancer overlapping sites - 11/01/2022, - 08/02/2022, - 05/03/2022, - 03/01/2022, - 02/03/2022, - 10/19/2021, - 2023, - 04/18/2021, - 2022, Patient here for follow-up after cystoscopy with fulguration and intravesical gemcitabine. There is not enough specimen to send to pathology. We discussed repeat induction BCG versus observation. We both agreed that we would proceed with observation and repeat cystoscopy in 3 months., - 2022, Patient with bladder cancer recurrence on surveillance cystoscopy today. We discussed the risks and benefits of cystoscopy with trans urethral resection of bladder tumor and intravesical gemcitabine. Risks include but are not limited to bleeding, pain, infection, damage to surrounding structures including  the bladder, urethra and ureters, need for Foley catheter, bladder perforation, pain. Patient understands and wishes to proceed with surgery. This will be scheduled the next available  date., - 2022, Will send urine cytology today. If negative will repeat cystoscopy in 3 months. We briefly discussed maintenance and will follow up by phone after cytology has resulted. , - 2021, - 2021, - 2021, - 2021, - 2021, - 2021, - 2021, - 2021 Postmenopausal atrophic vaginitis - 11/01/2022 Renal calculus (Stable) - 11/01/2022, - 2021, Kidney stone on right side, - 2014 Microscopic hematuria - 08/02/2022 Acute Cystitis/UTI - 2021, Acute cystitis without hematuria, - 2014 Bladder tumor/neoplasm - 2021, - 2021, - 2021 Gross hematuria - 2021, - 2021 Other microscopic hematuria - 2021, Microscopic hematuria, - 2014 Urge incontinence - 2021, - 2021      PMH Notes:  1898-06-19 00:00:00 - Note: Normal Routine History And Physical Adult   NON-GU PMH: Anxiety - 2021, Anxiety (Symptom), - 2014 Arthritis - 2021 Depression - 2021 Personal history of other endocrine, nutritional and metabolic disease - 2021, History of hypercholesterolemia, - 2014 Personal history of other mental and behavioral disorders - 2021, History of depression, - 2014    FAMILY HISTORY: Acute Myocardial Infarction - Runs In Family Congestive Heart Failure - Runs In Family Family Health Status Number - Runs In Family Heart Block - Runs In Family Murmurs - Runs In Family   SOCIAL HISTORY: None    Notes: Tobacco Use, Alcohol Use, Occupation:, Caffeine Use, Marital History - Currently Married   REVIEW OF SYSTEMS:    GU Review Female:   Patient denies frequent urination, hard to postpone urination, burning /pain with urination, get up at night to urinate, leakage of urine, stream starts and stops, trouble starting your stream, have to strain to urinate, and being pregnant.  Gastrointestinal (Upper):   Patient reports nausea and vomiting. Patient denies indigestion/ heartburn.  Gastrointestinal (Lower):   Patient denies diarrhea and constipation.  Constitutional:   Patient denies fever, night sweats, weight loss, and fatigue.   Skin:   Patient denies skin rash/ lesion and itching.  Musculoskeletal:   Patient denies back pain and joint pain.  Neurological:   Patient denies headaches and dizziness.  Psychologic:   Patient denies depression and anxiety.   Notes: Severe left flank pain.     VITAL SIGNS:      03/26/2023 01:45 PM  Weight 167 lb / 75.75 kg  Height 67 in / 170.18 cm  BP 141/86 mmHg  Pulse 80 /min  Temperature 97.7 F / 36.5 C  BMI 26.2 kg/m   MULTI-SYSTEM PHYSICAL EXAMINATION:    Constitutional: Well-nourished. No physical deformities. Normally developed. Good grooming.  Cardiovascular: Normal temperature, normal extremity pulses, no swelling, no varicosities.  Skin: No paleness, no jaundice, no cyanosis. No lesion, no ulcer, no rash.  Neurologic / Psychiatric: Oriented to time, oriented to place, oriented to person. No depression, no anxiety, no agitation.  Gastrointestinal: No mass, no tenderness, no rigidity, non obese abdomen.     Complexity of Data:  Source Of History:  Patient  Records Review:   Previous Doctor Records, Previous Patient Records  Urine Test Review:   Urinalysis  X-Ray Review: C.T. Abdomen/Pelvis: Reviewed Films. Reviewed Report.     03/26/23  Urinalysis  Urine Appearance Slightly Cloudy   Urine Color Yellow   Urine Glucose Neg mg/dL  Urine Bilirubin Neg mg/dL  Urine Ketones 1+ mg/dL  Urine Specific Gravity 1.025   Urine Blood 3+ ery/uL  Urine pH <=5.0   Urine Protein Neg mg/dL  Urine Urobilinogen 0.2 mg/dL  Urine Nitrites Neg   Urine Leukocyte Esterase Trace leu/uL  Urine WBC/hpf 0 - 5/hpf   Urine RBC/hpf 3 - 10/hpf   Urine Epithelial Cells 0 - 5/hpf   Urine Bacteria Few (10-25/hpf)   Urine Mucous Not Present   Urine Yeast NS (Not Seen)   Urine Trichomonas Not Present   Urine Cystals NS (Not Seen)   Urine Casts NS (Not Seen)   Urine Sperm Not Present    PROCEDURES:          Morphine 4mg  - J2270, 32440 zero wasted   Qty: 4 Adm. By: Lissa Hoard McDougald   Unit: mg Lot No [id number]  Route: IM Exp. Date 01/18/2024  Freq: None Mfgr.:   Site: Right Buttock         Phenergan 25mg  - [id number], J2550 Pt was cleansed and prepped with alcohol wipe, tolerated well, zero wasted.  EJ, CMA   Qty: 25 Adm. By: Luvenia Redden  Unit: mg Lot No [id number]  Route: IM Exp. Date 04/19/2024  Freq: None Mfgr.:   Site: Left Buttock   ASSESSMENT:      ICD-10 Details  1 GU:   Ureteral obstruction secondary to calculous - N13.2 Left, Acute, Threat to Bodily Function   PLAN:           Orders Labs CULTURE, URINE          Schedule Procedure: 03/26/2023 at [facility] - [phone] - Morphine 4mg  (Ther/Proph/Diag Inj, [place]/Im) - 59563, O7564  Procedure: 03/26/2023 at [facility] - [phone] - Phenergan 25mg  (Phenergan Per 50 Mg) - J2550, [id number]          Document Letter(s):  Created for Patient: Clinical Summary         Notes:   1. Hydronephrosis: She has uncontrolled pain and nausea with oral pain and nausea medications and has been to the ED twice this week. She was given a dose of IM morphine as well as IM Phenergan and still complains of nausea and pain. I advise proceeding to the OR for emergent stent placement versus ureteroscopy. I discussed this with Dr. Margo Aye, on-call physician. She understands that stents may be placed with possible second procedure to rid her of the stone versus they may be able to take the stone out as well. I discussed risks of surgery including risk for staged procedure, risk of infection, risk of adverse effects to anesthesia, and risk for increased discomfort related to stents. She and her husband verbalized understanding to these risks and wish to proceed        Next Appointment:      Next Appointment: 05/02/2023 08:00 AM    Appointment Type: Female Cysto    Location: [facility] [phone]    Provider: Kasandra Knudsen, M.D.    Reason for Visit: 6 Months - Cystoscopy      *  Signed by Bartholomew Crews, NP on 03/26/23 at 3:07 PM (EDT)*

## 2023-03-26 NOTE — Transfer of Care (Signed)
Immediate Anesthesia Transfer of Care Note  Patient: [name]  Procedure(s) Performed: CYSTOSCOPY, URETEROSCOPY AND STONE REMOVAL (Left)  Patient Location: PACU  Anesthesia Type:General  Level of Consciousness: awake, alert , and oriented  Airway & Oxygen Therapy: Patient Spontanous Breathing and Patient connected to face mask oxygen  Post-op Assessment: Report given to RN and Post -op Vital signs reviewed and stable  Post vital signs: Reviewed and stable  Last Vitals:  Vitals Value Taken Time  BP 126/73 03/26/23 1749  Temp    Pulse 73 03/26/23 1758  Resp 11 03/26/23 1758  SpO2 95 % 03/26/23 1758  Vitals shown include unfiled device data.  Last Pain:  Vitals:   03/26/23 1550  TempSrc: Oral  PainSc: 8       Patients Stated Pain Goal: 0 (03/26/23 1550)  Complications: No notable events documented.

## 2023-03-26 NOTE — H&P (Signed)
Assessment: Left distal ureteral stone   Plan: Patient elects to move forward with definitive stone management with cystoscopy, retrograde, ureteroscopy with possible laser lithotripsy/stone extraction and placement of ureteral stent.  Nature procedure discussed in detail including potential adverse events and complications.  Chief Complaint: left flank pain  History of Present Illness: [name] is a 77 y.o. female who has a 1 week history of left sided flank pain.  She was actually initially evaluated in the emergency department on 03/22/2023 and again presented to the emergency room yesterday with poor pain control.  Today she was seen in the office again with poor pain control nausea and vomiting.  After options were discussed with her she has elected definitive stone management. Repeat CT urogram yesterday--- 4 mm distal left ureteral stone is now 2.5 cm proximal to the UVJ, previously 3 cm proximal to the UVJ. 2. Increasing left renal edema, perirenal edema and increasing mild-to-moderate hydroureteronephrosis      Past Medical History:  Past Medical History:  Diagnosis Date   Anxiety    aniety attacks   Bladder tumor    Colon polyps    COPD (chronic obstructive pulmonary disease) (HCC)    09-08-2020 acute flare up with cough finished antibiotics and steroids cough resolved now   Depression    Hay fever    allergies   History of kidney stones    Migraines    not in 40 years   Wears glasses     Past Surgical History:  Past Surgical History:  Procedure Laterality Date   BILIARY STENT PLACEMENT  07/25/2022   Procedure: BILIARY STENT PLACEMENT;  Surgeon: Jeani Hawking, MD;  Location: [facility] ENDOSCOPY;  Service: Gastroenterology;;   BREAST BIOPSY  1990   caesarean secion  1974, 1977, 1984   CESAREAN SECTION  [phone]   CHOLECYSTECTOMY N/A 07/17/2022   Procedure: LAPAROSCOPIC SUBTOTAL CHOLECYSTECTOMY;  Surgeon: Griselda Miner, MD;  Location: [facility] OR;  Service:  General;  Laterality: N/A;   COLONOSCOPY W/ POLYPECTOMY  last done 2016    x 3    CYSTOSCOPY N/A 09/21/2020   Procedure: CYSTOSCOPY;  Surgeon: Noel Christmas, MD;  Location: [facility];  Service: Urology;  Laterality: N/A;   CYSTOSCOPY W/ RETROGRADES Bilateral 02/04/2020   Procedure: CYSTOSCOPY WITH RETROGRADE PYELOGRAM;  Surgeon: Crista Elliot, MD;  Location: [facility];  Service: Urology;  Laterality: Bilateral;   ENDOSCOPIC RETROGRADE CHOLANGIOPANCREATOGRAPHY (ERCP) WITH PROPOFOL N/A 02/02/2023   Procedure: ENDOSCOPIC RETROGRADE CHOLANGIOPANCREATOGRAPHY (ERCP) WITH PROPOFOL;  Surgeon: Jeani Hawking, MD;  Location: [facility] ENDOSCOPY;  Service: Gastroenterology;  Laterality: N/A;   ERCP N/A 07/22/2022   Procedure: ENDOSCOPIC RETROGRADE CHOLANGIOPANCREATOGRAPHY (ERCP);  Surgeon: Vida Rigger, MD;  Location: [facility] ENDOSCOPY;  Service: Gastroenterology;  Laterality: N/A;   ERCP N/A 07/25/2022   Procedure: ENDOSCOPIC RETROGRADE CHOLANGIOPANCREATOGRAPHY (ERCP);  Surgeon: Jeani Hawking, MD;  Location: [facility] ENDOSCOPY;  Service: Gastroenterology;  Laterality: N/A;   FINGER FRACTURE SURGERY Right 07/11/2022   5th digit has a screw   OPEN REDUCTION INTERNAL FIXATION (ORIF) DISTAL RADIAL FRACTURE Right 11/03/2014   Procedure: OPEN REDUCTION INTERNAL FIXATION (ORIF) RIGHT DISTAL RADIAL FRACTURE WITH REPAIR AND RECONSTRUCTION;  Surgeon: Dominica Severin, MD;  Location: [facility] OR;  Service: Orthopedics;  Laterality: Right;   PANCREATIC STENT PLACEMENT  07/22/2022   Procedure: PANCREATIC STENT PLACEMENT;  Surgeon: Vida Rigger, MD;  Location: [facility] ENDOSCOPY;  Service: Gastroenterology;;   Dennison Mascot  07/22/2022   Procedure: Dennison Mascot;  Surgeon: Vida Rigger, MD;  Location: [facility] ENDOSCOPY;  Service: Gastroenterology;;   Francine Graven REMOVAL  02/02/2023   Procedure: STENT REMOVAL;  Surgeon: Jeani Hawking, MD;  Location: [facility] ENDOSCOPY;  Service: Gastroenterology;;   TRANSURETHRAL RESECTION OF BLADDER  TUMOR WITH MITOMYCIN-C N/A 02/04/2020   Procedure: TRANSURETHRAL RESECTION OF BLADDER TUMOR WITH POST OPERATIVE GEMCITABINE;  Surgeon: Crista Elliot, MD;  Location: [facility];  Service: Urology;  Laterality: N/A;   TRANSURETHRAL RESECTION OF BLADDER TUMOR WITH MITOMYCIN-C N/A 09/21/2020   Procedure: CYSTOSCOPY WITH BLADDER FULGARATION WITH POST OP GEMCITABINE;  Surgeon: Noel Christmas, MD;  Location: [facility];  Service: Urology;  Laterality: N/A;  1 HR    Allergies:  Allergies  Allergen Reactions   Aspirin Swelling and Other (See Comments)    Vasculitis     Family History:  Family History  Problem Relation Age of Onset   Arthritis Other    Stroke Other    Hypertension Other    Depression Other    Migraines Other    Colon polyps Other    Breast cancer Neg Hx     Social History:  Social History   Tobacco Use   Smoking status: Former    Current packs/day: 0.00    Average packs/day: 1.5 packs/day for 50.0 years (75.0 ttl pk-yrs)    Types: Cigarettes    Start date: 37    Quit date: 2012    Years since quitting: 12.7   Smokeless tobacco: Never   Tobacco comments:    Former smoker  Advertising account planner   Vaping status: Never Used  Substance Use Topics   Alcohol use: Yes    Alcohol/week: 5.0 standard drinks of alcohol    Types: 5 Glasses of wine per week   Drug use: No    Review of symptoms:  Constitutional:  Negative for unexplained weight loss, night sweats, fever, chills ENT:  Negative for nose bleeds, sinus pain, painful swallowing CV:  Negative for chest pain, shortness of breath, exercise intolerance, palpitations, loss of consciousness Resp:  Negative for cough, wheezing, shortness of breath GI:  Negative for nausea, vomiting, diarrhea, bloody stools GU:  Positives noted in HPI; otherwise negative for gross hematuria, dysuria, urinary incontinence Neuro:  Negative for seizures, poor balance, limb weakness, slurred  speech Psych:  Negative for lack of energy, depression, anxiety Endocrine:  Negative for polydipsia, polyuria, symptoms of hypoglycemia (dizziness, hunger, sweating) Hematologic:  Negative for anemia, purpura, petechia, prolonged or excessive bleeding, use of anticoagulants  Allergic:  Negative for difficulty breathing or choking as a result of exposure to anything; no shellfish allergy; no allergic response (rash/itch) to materials, foods  Physical exam: BP (!) 148/81   Pulse 70   Temp 98.7 F (37.1 C) (Oral)   Resp 16   SpO2 93%  GENERAL APPEARANCE:  Well appearing, well developed, well nourished, NAD   Results: UA negative for infection

## 2023-03-27 ENCOUNTER — Encounter (HOSPITAL_COMMUNITY): Payer: Self-pay | Admitting: Urology

## 2023-03-28 NOTE — Anesthesia Postprocedure Evaluation (Signed)
Anesthesia Post Note  Patient: [name]  Procedure(s) Performed: CYSTOSCOPY, URETEROSCOPY AND STONE REMOVAL (Left)     Patient location during evaluation: PACU Anesthesia Type: General Level of consciousness: awake and alert Pain management: pain level controlled Vital Signs Assessment: post-procedure vital signs reviewed and stable Respiratory status: spontaneous breathing, nonlabored ventilation, respiratory function stable and patient connected to nasal cannula oxygen Cardiovascular status: blood pressure returned to baseline and stable Postop Assessment: no apparent nausea or vomiting Anesthetic complications: no   No notable events documented.  Last Vitals:  Vitals:   03/26/23 1845 03/26/23 1900  BP: 132/71 111/69  Pulse: 78 75  Resp: 15 16  Temp:  36.7 C  SpO2: 92% 93%    Last Pain:  Vitals:   03/26/23 1900  TempSrc:   PainSc: 0-No pain                 Arra Connaughton L Sabir Charters

## 2023-04-12 NOTE — Plan of Care (Signed)
[facility] Tonsillectomy/Adenoidectomy, Postoperative PEDS care plan entered in error.

## 2023-04-18 DIAGNOSIS — J40 Bronchitis, not specified as acute or chronic: Secondary | ICD-10-CM | POA: Diagnosis not present

## 2023-04-18 DIAGNOSIS — U099 Post covid-19 condition, unspecified: Secondary | ICD-10-CM | POA: Diagnosis not present

## 2023-04-25 DIAGNOSIS — N132 Hydronephrosis with renal and ureteral calculous obstruction: Secondary | ICD-10-CM | POA: Diagnosis not present

## 2023-05-01 DIAGNOSIS — H43813 Vitreous degeneration, bilateral: Secondary | ICD-10-CM | POA: Diagnosis not present

## 2023-05-01 DIAGNOSIS — H25813 Combined forms of age-related cataract, bilateral: Secondary | ICD-10-CM | POA: Diagnosis not present

## 2023-05-01 DIAGNOSIS — H5213 Myopia, bilateral: Secondary | ICD-10-CM | POA: Diagnosis not present

## 2023-05-02 DIAGNOSIS — C678 Malignant neoplasm of overlapping sites of bladder: Secondary | ICD-10-CM | POA: Diagnosis not present

## 2023-05-08 ENCOUNTER — Encounter: Payer: Self-pay | Admitting: Pulmonary Disease

## 2023-05-08 ENCOUNTER — Ambulatory Visit: Payer: Medicare HMO | Admitting: Pulmonary Disease

## 2023-05-08 VITALS — BP 100/62 | HR 76 | Temp 97.7°F | Ht 67.0 in | Wt 169.6 lb

## 2023-05-08 DIAGNOSIS — J432 Centrilobular emphysema: Secondary | ICD-10-CM

## 2023-05-08 NOTE — Progress Notes (Unsigned)
Synopsis: Referred in February 2024 for COPD exacerbation by Carl Best, PA  Subjective:   PATIENT ID: [name] GENDER: female DOB: 01-26-1946, MRN: [id number]  HPI  Chief Complaint  Patient presents with   Follow-up    Breathing doing well.   [name] is a 77 year old woman, former smoker with history of COPD who returns to pulmonary clinic for COPD.   Initial OV 08/14/22 She was admitted 1/27 to 2/8 for acute cholecystitis and developed issues with her breathing and was treated for COPD exacerbation.   She reports every fall she gets sick with cough, wheezing and shortness of breath which usually lasts for 3 weeks. She has not been on inhaler therapy in the past.   She is a former smoker with 75 pack year history, quit in 2012. She has no history of dust or chemical exposure. She performed secretarial work. She livers with her husband and son. She does not have pets. Family history of COPD/Asthma in one sister and rheumatoid arthritis in another sister.   Today OV 05/08/23  She reports doing well on her current regimen of Trelegy, noting that she has not needed her nebulizer. She denies any cough or wheezing. She does experience shortness of breath with activity, but attributes this to her COPD and not to any new or worsening symptoms. She continues to walk daily, although the distance varies. She reports no changes in her routine activities, attributing any perceived increase in shortness of breath to disruptions in her routine, such as her husband's recent hospitalization for a pacemaker insertion. She has not had any exacerbations of her COPD recently, although she notes that fall is typically a difficult time for her.   Past Medical History:  Diagnosis Date   Anxiety    aniety attacks   Bladder tumor    Colon polyps    COPD (chronic obstructive pulmonary disease) (HCC)    09-08-2020 acute flare up with cough finished antibiotics and steroids cough resolved  now   Depression    Hay fever    allergies   History of kidney stones    Migraines    not in 40 years   Wears glasses      Family History  Problem Relation Age of Onset   Arthritis Other    Stroke Other    Hypertension Other    Depression Other    Migraines Other    Colon polyps Other    Breast cancer Neg Hx      Social History   Socioeconomic History   Marital status: Married    Spouse name: Not on file   Number of children: 3   Years of education: 13   Highest education level: Some college, no degree  Occupational History    Comment: Retired   Tobacco Use   Smoking status: Former    Current packs/day: 0.00    Average packs/day: 1.5 packs/day for 50.0 years (75.0 ttl pk-yrs)    Types: Cigarettes    Start date: 64    Quit date: 2012    Years since quitting: 12.8   Smokeless tobacco: Never   Tobacco comments:    Former smoker  Advertising account planner   Vaping status: Never Used  Substance and Sexual Activity   Alcohol use: Yes    Alcohol/week: 5.0 standard drinks of alcohol    Types: 5 Glasses of wine per week   Drug use: No   Sexual activity: Not on file  Other Topics  Concern   Not on file  Social History Narrative   Married   3 children   Social Determinants of Health   Financial Resource Strain: Low Risk  (08/09/2022)   Overall Financial Resource Strain (CARDIA)    Difficulty of Paying Living Expenses: Not hard at all  Food Insecurity: No Food Insecurity (08/09/2022)   Hunger Vital Sign    Worried About Running Out of Food in the Last Year: Never true    Ran Out of Food in the Last Year: Never true  Transportation Needs: No Transportation Needs (08/09/2022)   PRAPARE - Administrator, Civil Service (Medical): No    Lack of Transportation (Non-Medical): No  Physical Activity: Inactive (08/09/2022)   Exercise Vital Sign    Days of Exercise per Week: 0 days    Minutes of Exercise per Session: 0 min  Stress: No Stress Concern Present (08/09/2022)    [employer] of Occupational Health - Occupational Stress Questionnaire    Feeling of Stress : Not at all  Social Connections: Moderately Isolated (08/09/2022)   Social Connection and Isolation Panel [NHANES]    Frequency of Communication with Friends and Family: More than three times a week    Frequency of Social Gatherings with Friends and Family: More than three times a week    Attends Religious Services: Never    Database administrator or Organizations: No    Attends Banker Meetings: Never    Marital Status: Married  Catering manager Violence: Not At Risk (08/09/2022)   Humiliation, Afraid, Rape, and Kick questionnaire    Fear of Current or Ex-Partner: No    Emotionally Abused: No    Physically Abused: No    Sexually Abused: No     Allergies  Allergen Reactions   Aspirin Swelling and Other (See Comments)    Vasculitis      Outpatient Medications Prior to Visit  Medication Sig Dispense Refill   albuterol (PROVENTIL) (2.5 MG/3ML) 0.083% nebulizer solution Take 3 mLs (2.5 mg total) by nebulization every 6 (six) hours as needed for wheezing or shortness of breath. 75 mL 12   albuterol (VENTOLIN HFA) 108 (90 Base) MCG/ACT inhaler Inhale 2 puffs into the lungs every 6 (six) hours as needed for wheezing or shortness of breath. 8 g 2   Dextromethorphan-guaiFENesin (MUCINEX DM) 30-600 MG TB12 Take 1 tablet by mouth in the morning and at bedtime.     FLUoxetine (PROZAC) 20 MG capsule TAKE 1 CAPSULE BY MOUTH EVERY DAY (Patient taking differently: Take 20 mg by mouth in the morning.) 90 capsule 0   fluticasone (FLONASE) 50 MCG/ACT nasal spray Place 1 spray into both nostrils daily. 18.2 mL 2   Fluticasone-Umeclidin-Vilant (TRELEGY ELLIPTA) 100-62.5-25 MCG/ACT AEPB Inhale 1 puff into the lungs daily. 60 each 5   TYLENOL 500 MG tablet Take 500 mg by mouth every 6 (six) hours as needed for mild pain or headache.     guaiFENesin (MUCINEX) 600 MG 12 hr tablet Take 1 tablet (600  mg total) by mouth 2 (two) times daily as needed for to loosen phlegm. (Patient not taking: Reported on 03/26/2023)     ondansetron (ZOFRAN-ODT) 4 MG disintegrating tablet Take 1 tablet (4 mg total) by mouth every 8 (eight) hours as needed. (Patient not taking: Reported on 05/08/2023) 20 tablet 0   traMADol (ULTRAM) 50 MG tablet Take 1 tablet (50 mg total) by mouth every 6 (six) hours as needed. (Patient not taking: Reported on  05/08/2023) 12 tablet 0   No facility-administered medications prior to visit.   Review of Systems  Constitutional:  Negative for chills, fever, malaise/fatigue and weight loss.  HENT:  Negative for congestion, sinus pain and sore throat.   Eyes: Negative.   Respiratory:  Positive for shortness of breath. Negative for cough, hemoptysis, sputum production and wheezing.   Cardiovascular:  Negative for chest pain, palpitations, orthopnea, claudication and leg swelling.  Gastrointestinal:  Negative for abdominal pain, heartburn, nausea and vomiting.  Genitourinary: Negative.   Musculoskeletal:  Negative for joint pain and myalgias.  Skin:  Negative for rash.  Neurological:  Negative for weakness.  Endo/Heme/Allergies: Negative.    Objective:   Vitals:   05/08/23 1445  BP: 100/62  Pulse: 76  Temp: 97.7 F (36.5 C)  TempSrc: Oral  SpO2: 97%  Weight: 169 lb 9.6 oz (76.9 kg)  Height: 5\' 7"  (1.702 m)     Physical Exam Constitutional:      General: She is not in acute distress.    Appearance: She is not ill-appearing.  HENT:     Head: Normocephalic and atraumatic.  Eyes:     Conjunctiva/sclera: Conjunctivae normal.  Cardiovascular:     Rate and Rhythm: Normal rate and regular rhythm.     Pulses: Normal pulses.     Heart sounds: Normal heart sounds. No murmur heard. Pulmonary:     Effort: Pulmonary effort is normal.     Breath sounds: Normal breath sounds. No wheezing, rhonchi or rales.  Musculoskeletal:     Right lower leg: No edema.     Left lower leg:  No edema.  Skin:    General: Skin is warm and dry.  Neurological:     General: No focal deficit present.     Mental Status: She is alert.    CBC    Component Value Date/Time   WBC 11.7 (H) 03/25/2023 0157   RBC 4.23 03/25/2023 0157   HGB 13.2 03/25/2023 0157   HCT 40.0 03/25/2023 0157   PLT 244 03/25/2023 0157   MCV 94.6 03/25/2023 0157   MCH 31.2 03/25/2023 0157   MCHC 33.0 03/25/2023 0157   RDW 12.6 03/25/2023 0157   LYMPHSABS 1.0 03/22/2023 0401   MONOABS 0.4 03/22/2023 0401   EOSABS 0.0 03/22/2023 0401   BASOSABS 0.0 03/22/2023 0401      Latest Ref Rng & Units 03/25/2023    1:57 AM 03/22/2023    4:01 AM 07/26/2022    7:55 AM  BMP  Glucose 70 - 99 mg/dL 409  811  914   BUN 8 - 23 mg/dL 20  20  8    Creatinine 0.44 - 1.00 mg/dL 7.82  9.56  2.13   Sodium 135 - 145 mmol/L 135  138  140   Potassium 3.5 - 5.1 mmol/L 3.6  4.3  3.5   Chloride 98 - 111 mmol/L 103  105  102   CO2 22 - 32 mmol/L 24  21  26    Calcium 8.9 - 10.3 mg/dL 8.7  9.0  8.6    Chest imaging: CTA Chest 07/19/22 Cardiovascular: There are no filling defects within the pulmonary arteries to suggest pulmonary embolus. Mild aortic atherosclerosis and tortuosity. No aneurysm or acute aortic findings. The heart is normal in size. No pericardial effusion.   Mediastinum/Nodes: No mediastinal or hilar adenopathy. Decompressed esophagus.   Lungs/Pleura: Moderate emphysema. Bandlike atelectasis within the lingula, right middle lobe in both lower lobes. There is also dependent atelectasis in  the right greater than left lower lobe. No significant effusion. The small pulmonary nodules on prior lung cancer screening CT are obscured by motion on the current exam. No obvious pulmonary mass. No features of pulmonary edema.  PFT:     No data to display          Labs:  Path:  Echo:  Heart Catheterization:  Assessment & Plan:   Centrilobular emphysema (HCC)  Discussion: [name] is a 77 year old  woman, former smoker with history of COPD who returns to pulmonary clinic for COPD.   Chronic Obstructive Pulmonary Disease (COPD) Stable on Trelegy with no recent exacerbations. No current cough or wheezing. Shortness of breath with activity is consistent with baseline. No avoidance of activities due to respiratory symptoms. -Continue Trelegy as current maintenance therapy. -Encourage daily walking as tolerated.  Follow up in 1 year, call sooner if needed.  Melody Comas, MD [facility] Office: [phone]   Current Outpatient Medications:    albuterol (PROVENTIL) (2.5 MG/3ML) 0.083% nebulizer solution, Take 3 mLs (2.5 mg total) by nebulization every 6 (six) hours as needed for wheezing or shortness of breath., Disp: 75 mL, Rfl: 12   albuterol (VENTOLIN HFA) 108 (90 Base) MCG/ACT inhaler, Inhale 2 puffs into the lungs every 6 (six) hours as needed for wheezing or shortness of breath., Disp: 8 g, Rfl: 2   Dextromethorphan-guaiFENesin (MUCINEX DM) 30-600 MG TB12, Take 1 tablet by mouth in the morning and at bedtime., Disp: , Rfl:    FLUoxetine (PROZAC) 20 MG capsule, TAKE 1 CAPSULE BY MOUTH EVERY DAY (Patient taking differently: Take 20 mg by mouth in the morning.), Disp: 90 capsule, Rfl: 0   fluticasone (FLONASE) 50 MCG/ACT nasal spray, Place 1 spray into both nostrils daily., Disp: 18.2 mL, Rfl: 2   Fluticasone-Umeclidin-Vilant (TRELEGY ELLIPTA) 100-62.5-25 MCG/ACT AEPB, Inhale 1 puff into the lungs daily., Disp: 60 each, Rfl: 5   TYLENOL 500 MG tablet, Take 500 mg by mouth every 6 (six) hours as needed for mild pain or headache., Disp: , Rfl:

## 2023-05-08 NOTE — Patient Instructions (Signed)
Continue trelegy ellipta 1 puff daily - rinse mouth out after each use  Use albuterol inhaler or nebulizer as needed  Follow up in 1 year, call sooner if needed

## 2023-05-09 ENCOUNTER — Encounter: Payer: Self-pay | Admitting: Pulmonary Disease

## 2023-05-13 ENCOUNTER — Other Ambulatory Visit: Payer: Self-pay | Admitting: Family Medicine

## 2023-05-19 DIAGNOSIS — U099 Post covid-19 condition, unspecified: Secondary | ICD-10-CM | POA: Diagnosis not present

## 2023-05-19 DIAGNOSIS — J40 Bronchitis, not specified as acute or chronic: Secondary | ICD-10-CM | POA: Diagnosis not present

## 2023-05-29 ENCOUNTER — Other Ambulatory Visit: Payer: Self-pay | Admitting: Family Medicine

## 2023-05-29 DIAGNOSIS — Z1231 Encounter for screening mammogram for malignant neoplasm of breast: Secondary | ICD-10-CM

## 2023-06-18 DIAGNOSIS — J40 Bronchitis, not specified as acute or chronic: Secondary | ICD-10-CM | POA: Diagnosis not present

## 2023-06-18 DIAGNOSIS — U099 Post covid-19 condition, unspecified: Secondary | ICD-10-CM | POA: Diagnosis not present

## 2023-06-29 DIAGNOSIS — Z008 Encounter for other general examination: Secondary | ICD-10-CM | POA: Diagnosis not present

## 2023-07-07 ENCOUNTER — Other Ambulatory Visit: Payer: Self-pay | Admitting: Nurse Practitioner

## 2023-07-07 DIAGNOSIS — J432 Centrilobular emphysema: Secondary | ICD-10-CM

## 2023-07-07 DIAGNOSIS — J209 Acute bronchitis, unspecified: Secondary | ICD-10-CM

## 2023-07-19 DIAGNOSIS — U099 Post covid-19 condition, unspecified: Secondary | ICD-10-CM | POA: Diagnosis not present

## 2023-07-19 DIAGNOSIS — J40 Bronchitis, not specified as acute or chronic: Secondary | ICD-10-CM | POA: Diagnosis not present

## 2023-07-20 ENCOUNTER — Ambulatory Visit
Admission: RE | Admit: 2023-07-20 | Discharge: 2023-07-20 | Disposition: A | Discharge status: Home / Self Care | Payer: Medicare HMO | Source: Ambulatory Visit | Attending: Family Medicine | Admitting: Family Medicine

## 2023-07-20 DIAGNOSIS — Z1231 Encounter for screening mammogram for malignant neoplasm of breast: Secondary | ICD-10-CM

## 2023-08-09 ENCOUNTER — Other Ambulatory Visit: Payer: Self-pay | Admitting: Family Medicine

## 2023-08-17 DIAGNOSIS — J40 Bronchitis, not specified as acute or chronic: Secondary | ICD-10-CM | POA: Diagnosis not present

## 2023-08-17 DIAGNOSIS — U099 Post covid-19 condition, unspecified: Secondary | ICD-10-CM | POA: Diagnosis not present

## 2023-09-13 ENCOUNTER — Other Ambulatory Visit: Payer: Self-pay | Admitting: Family Medicine

## 2023-09-16 DIAGNOSIS — U099 Post covid-19 condition, unspecified: Secondary | ICD-10-CM | POA: Diagnosis not present

## 2023-09-16 DIAGNOSIS — J40 Bronchitis, not specified as acute or chronic: Secondary | ICD-10-CM | POA: Diagnosis not present

## 2023-10-06 ENCOUNTER — Other Ambulatory Visit: Payer: Self-pay | Admitting: Family Medicine

## 2023-10-15 ENCOUNTER — Other Ambulatory Visit: Payer: Self-pay | Admitting: Nurse Practitioner

## 2023-10-15 DIAGNOSIS — J432 Centrilobular emphysema: Secondary | ICD-10-CM

## 2023-10-15 DIAGNOSIS — J209 Acute bronchitis, unspecified: Secondary | ICD-10-CM

## 2023-10-15 DIAGNOSIS — J018 Other acute sinusitis: Secondary | ICD-10-CM

## 2023-10-17 DIAGNOSIS — U099 Post covid-19 condition, unspecified: Secondary | ICD-10-CM | POA: Diagnosis not present

## 2023-10-17 DIAGNOSIS — J40 Bronchitis, not specified as acute or chronic: Secondary | ICD-10-CM | POA: Diagnosis not present

## 2023-10-26 ENCOUNTER — Telehealth: Payer: Self-pay | Admitting: Nurse Practitioner

## 2023-10-26 NOTE — Telephone Encounter (Signed)
 Cmn received from [facility] for Nebulizer w/ supplies.

## 2023-10-31 DIAGNOSIS — R8289 Other abnormal findings on cytological and histological examination of urine: Secondary | ICD-10-CM | POA: Diagnosis not present

## 2023-10-31 DIAGNOSIS — N200 Calculus of kidney: Secondary | ICD-10-CM | POA: Diagnosis not present

## 2023-10-31 DIAGNOSIS — N952 Postmenopausal atrophic vaginitis: Secondary | ICD-10-CM | POA: Diagnosis not present

## 2023-10-31 DIAGNOSIS — C678 Malignant neoplasm of overlapping sites of bladder: Secondary | ICD-10-CM | POA: Diagnosis not present

## 2023-10-31 DIAGNOSIS — Z8551 Personal history of malignant neoplasm of bladder: Secondary | ICD-10-CM | POA: Diagnosis not present

## 2023-11-07 ENCOUNTER — Other Ambulatory Visit: Payer: Self-pay | Admitting: Family Medicine

## 2023-11-09 NOTE — Telephone Encounter (Signed)
 CMN faxed successfully and signed.

## 2023-11-16 DIAGNOSIS — U099 Post covid-19 condition, unspecified: Secondary | ICD-10-CM | POA: Diagnosis not present

## 2023-11-16 DIAGNOSIS — J40 Bronchitis, not specified as acute or chronic: Secondary | ICD-10-CM | POA: Diagnosis not present

## 2023-11-21 ENCOUNTER — Encounter: Payer: Self-pay | Admitting: Nurse Practitioner

## 2023-11-21 ENCOUNTER — Ambulatory Visit: Admitting: Nurse Practitioner

## 2023-11-21 VITALS — BP 122/80 | HR 83 | Ht 67.0 in | Wt 171.6 lb

## 2023-11-21 DIAGNOSIS — R058 Other specified cough: Secondary | ICD-10-CM

## 2023-11-21 DIAGNOSIS — Z87891 Personal history of nicotine dependence: Secondary | ICD-10-CM | POA: Diagnosis not present

## 2023-11-21 DIAGNOSIS — J309 Allergic rhinitis, unspecified: Secondary | ICD-10-CM

## 2023-11-21 DIAGNOSIS — J449 Chronic obstructive pulmonary disease, unspecified: Secondary | ICD-10-CM

## 2023-11-21 DIAGNOSIS — J432 Centrilobular emphysema: Secondary | ICD-10-CM

## 2023-11-21 DIAGNOSIS — J209 Acute bronchitis, unspecified: Secondary | ICD-10-CM

## 2023-11-21 LAB — POCT EXHALED NITRIC OXIDE: FeNO level (ppb): 16

## 2023-11-21 MED ORDER — PROMETHAZINE-DM 6.25-15 MG/5ML PO SYRP
5.0000 mL | ORAL_SOLUTION | Freq: Four times a day (QID) | ORAL | 0 refills | Status: DC | PRN
Start: 2023-11-21 — End: 2024-01-01

## 2023-11-21 MED ORDER — IPRATROPIUM BROMIDE 0.06 % NA SOLN: 2.0000 | Freq: Four times a day (QID) | NASAL | 12 refills | Status: DC

## 2023-11-21 MED ORDER — AZITHROMYCIN 250 MG PO TABS
ORAL_TABLET | ORAL | 0 refills | Status: DC
Start: 2023-11-21 — End: 2024-01-01

## 2023-11-21 MED ORDER — PREDNISONE 20 MG PO TABS
40.0000 mg | ORAL_TABLET | Freq: Every day | ORAL | 0 refills | Status: AC
Start: 2023-11-21 — End: 2023-11-26

## 2023-11-21 MED ORDER — BENZONATATE 200 MG PO CAPS
200.0000 mg | ORAL_CAPSULE | Freq: Three times a day (TID) | ORAL | 1 refills | Status: DC | PRN
Start: 2023-11-21 — End: 2024-01-01

## 2023-11-21 NOTE — Progress Notes (Signed)
 @Patient  ID: [name], female    DOB: 1946-03-15, 78 y.o.   MRN: [id number]  Chief Complaint  Patient presents with   Follow-up    Patient states she's been coughing more than usually and wheezing.    Referring provider: Marquetta Sit, MD  HPI: 78 year old female, former smoker followed for emphysema. She is a patient of Dr. Reine Caraway and last seen in office 05/08/2023. Past medical history significant for allergic rhinitis, osteoporosis, hx of bladder cancer, HLD, depression.  TEST/EVENTS:  07/19/2022 CTA chest: no evidence of PE. Atherosclerosis. Decompressed esophagus. Moderate emphysema. Bandlike atelectasis within the lingula, RML and both lower lobes. Previous lung nodules obscured due to motion.  10/30/2022 CXR: diffuse pattern of perihilar bronchial thickening, more affecting the lower lung zones and slightly more prominent on the right compared to the left.   08/14/2022: OV with Dr. Diania Fortes for pulmonary consult. Referred for COPD exacerbation. Admitted 1/27-2/8 for acute cholecystitis and developed issues with her breathing - treated for AECOPD. Reports every fall she gets sick with cough, wheezing, SOB. Usually lasts about 3 weeks. Not on inhalers in the past. Former smoker with 75 pack year history. Recovered well since recent admission. No acute issues with her breathing at this time. Preferred to hold off on maintenance therapy until PFTs. F/u 4 months with PFTs.  10/30/2022: OV with Shanaye Rief NP for acute visit. She had COVID around 4/11. She was treated with Paxlovid . She had fevers and chills during her initial illness as well as some URI symptoms and occasional dry cough. She felt like she was recovering but then over the past week has started to feel worse. She has a productive cough with yellow phlegm and is having a lot of chest congestion. She's feeling very fatigued and appetite is not as good as it normally is. She's more short of breath during the day, especially with  longer distances. She's also wheezing at night, which she normally doesn't do. She does still have some sinus congestion and occasionally feels like she has drainage down the back of her throat. She denies any recurrent fevers, chills, hemoptysis, leg swelling, orthopnea. She does not have a rescue inhaler or maintenance. Taking cough syrup she had previously been prescribed and mucinex  over the counter.   11/16/2022:  OV with Julio Zappia NP for follow up. She is feeling much better compared to her last visit but she does feel like since she came off the steroids and abx, that her cough has started to pick back up again. It can be barky at times. She occasionally produces some white phlegm but sometimes feels like it gets stuck. Her breathing has improved, feels closer to her baseline but still a little more short than normal. She's still noticing wheezing at night, which restarted after she completed steroids. Sinus symptoms have resolved. Denies any fevers, chills, hemoptysis, leg swelling, orthopnea. She has not had to use the rescue inhaler. Insurance wouldn't cover Stiolto so she was prescribed Anoro, which she feels like is helping.   01/01/2023: Ov with Janalynn Eder NP for follow up. After our last visit, she was treated with Z pack and prednisone . She felt better after this. Her breathing feels back to her baseline. She's still coughing, which seems to have picked up some after she finished the steroids. It is primarily dry but occasionally she will get up a small amount of clear phlegm. She uses her nebs every 3 days or so when her chest is feeling more congested,  which does help. She is not having any sinus symptoms or reflux. Some wheezing at night. She denies fevers, chills,  hemoptysis, leg swelling, calf pain. She is using her Anoro every morning. Does feel like this has helped.  FeNO 26 ppb  02/12/2023: OV with Michelina Mexicano NP for follow up. Her cough has resolved. She's feeling much better. She feels like she's back to  her baseline. Breathing is doing well. The mucinex  helps with her congestion. She's not having to use any cough suppressants. Hasn't had to do her nebs recently. Feels like the Trelegy is helping.   05/08/2023: OV with Dr. Diania Fortes. Doing well on Trelegy. Denies any cough, wheezing, SOB. Continues to walk daily. Distance varies. Reports no changes in routine activities. No hospitalizations. Fall is typically a hard time for her. Continue current regimen.  11/21/2023: Today - acute Discussed the use of AI scribe software for clinical note transcription with the patient, who gave verbal consent to proceed.  History of Present Illness   [name] is a 78 year old female with COPD who presents with cough and difficulty breathing.  She has been experiencing a persistent cough for the past week, with a particularly severe episode on Monday. The cough is productive, with sputum described as 'whitey, yellow in color. No fever, chills, or hemoptysis are present. Although her breathing feels okay at rest, she struggles to keep up with her husband during walks, especially uphill, which is unusual for her. She does feel some better today.   She has been using Trelegy and has increased her use of the nebulizer to help clear her airways. Additionally, she uses Flonase  nasal spray and takes Mucinex  twice daily, which she finds helpful. She has tried over the counter cough syrups with some benefit.   Stress has been a significant factor recently, as her husband has been diagnosed with esophageal and prostate cancer, leading to frequent visits to doctors and radiation oncologists.   She experiences postnasal drip but does not feel congested. She has not undergone allergy testing before. Fall is usually the worst for her as far as allergy symptoms go.   No sinus symptoms, headaches.  FeNO 16 ppb      Allergies  Allergen Reactions   Aspirin Swelling and Other (See Comments)    Vasculitis      Immunization History  Administered Date(s) Administered   Fluad Quad(high Dose 65+) 01/26/2022   Hepatitis A, Adult 05/03/2015   Influenza Split 03/14/2011, 04/29/2012, 02/20/2013   Influenza Whole 03/19/2010   Influenza, High Dose Seasonal PF 03/26/2014, 03/11/2017, 02/04/2018, 02/11/2019   Influenza-Unspecified 03/16/2015, 03/06/2016, 03/11/2017, 02/06/2020, 03/14/2021, 01/26/2022   PFIZER(Purple Top)SARS-COV-2 Vaccination 07/14/2019, 08/04/2019, 02/16/2020   PNEUMOCOCCAL CONJUGATE-20 06/16/2021   Pfizer Covid-19 Vaccine Bivalent Booster 75yrs & up 01/13/2021, 06/16/2021   Pneumococcal Conjugate-13 01/02/2017, 09/01/2019   Pneumococcal Polysaccharide-23 11/03/2009   Rsv, Bivalent, Protein Subunit Rsvpref,pf Pattricia Bores) 03/24/2022   Td 06/19/2006, 08/27/2007   Tdap 06/06/2019   Unspecified SARS-COV-2 Vaccination 03/24/2022   Zoster Recombinant(Shingrix) 03/11/2017, 06/30/2017    Past Medical History:  Diagnosis Date   Anxiety    aniety attacks   Bladder tumor    Colon polyps    COPD (chronic obstructive pulmonary disease) (HCC)    09-08-2020 acute flare up with cough finished antibiotics and steroids cough resolved now   Depression    Hay fever    allergies   History of kidney stones    Migraines    not in 40 years  Wears glasses     Tobacco History: Social History   Tobacco Use  Smoking Status Former   Current packs/day: 0.00   Average packs/day: 1.5 packs/day for 50.0 years (75.0 ttl pk-yrs)   Types: Cigarettes   Start date: 8   Quit date: 2012   Years since quitting: 13.4  Smokeless Tobacco Never  Tobacco Comments   Former smoker   Counseling given: Not Answered Tobacco comments: Former smoker   Outpatient Medications Prior to Visit  Medication Sig Dispense Refill   albuterol  (PROVENTIL ) (2.5 MG/3ML) 0.083% nebulizer solution Take 3 mLs (2.5 mg total) by nebulization every 6 (six) hours as needed for wheezing or shortness of breath. 75 mL 12    albuterol  (VENTOLIN  HFA) 108 (90 Base) MCG/ACT inhaler Inhale 2 puffs into the lungs every 6 (six) hours as needed for wheezing or shortness of breath. 8 g 2   Dextromethorphan-guaiFENesin  (MUCINEX  DM) 30-600 MG TB12 Take 1 tablet by mouth in the morning and at bedtime.     FLUoxetine  (PROZAC ) 20 MG capsule TAKE 1 CAPSULE BY MOUTH EVERY DAY 30 capsule 0   fluticasone  (FLONASE ) 50 MCG/ACT nasal spray SPRAY 1 SPRAY INTO BOTH NOSTRILS DAILY. 16 mL 2   Fluticasone -Umeclidin-Vilant (TRELEGY ELLIPTA ) 100-62.5-25 MCG/ACT AEPB TAKE 1 PUFF BY MOUTH EVERY DAY 60 each 11   TYLENOL  500 MG tablet Take 500 mg by mouth every 6 (six) hours as needed for mild pain or headache.     No facility-administered medications prior to visit.     Review of Systems:   Constitutional: No weight loss or gain, night sweats, fevers, chills, or lassitude, fatigue HEENT: No headaches, difficulty swallowing, tooth/dental problems, or sore throat. No sneezing, itching, ear ache + nasal congestion, post nasal drip CV:  No chest pain, orthopnea, PND, swelling in lower extremities, anasarca, dizziness, palpitations, syncope Resp: +productive cough; shortness of breath with exertion. No wheeze. No hemoptysis. No chest wall deformity GI:  No heartburn, indigestion, abdominal pain, nausea, vomiting, diarrhea, change in bowel habits, bloody stools, loss of appetite.  GU: No dysuria, change in color of urine, urgency or frequency. Skin: No rash, lesions, ulcerations MSK:  No joint pain or swelling.   Neuro: No dizziness or lightheadedness.  Psych: No depression.  +stress, anxiety. No SI/HI. Mood stable.     Physical Exam:  BP 122/80 (BP Location: Right Arm, Patient Position: Sitting)   Pulse 83   Ht 5\' 7"  (1.702 m)   Wt 171 lb 9.6 oz (77.8 kg)   SpO2 93%   BMI 26.88 kg/m   GEN: Pleasant, interactive, well-appearing; in no acute distress. HEENT:  Normocephalic and atraumatic. PERRLA. Sclera white. Nasal turbinates pink,  moist and patent bilaterally. Clear rhinorrhea present. No sinus tenderness. Oropharynx pink and moist, without exudate or edema. No lesions, ulcerations, or postnasal drip.  NECK:  Supple w/ fair ROM. No JVD present. Normal carotid impulses w/o bruits. Thyroid  symmetrical with no goiter or nodules palpated. No lymphadenopathy.   CV: RRR, no m/r/g, no peripheral edema. Pulses intact, +2 bilaterally. No cyanosis, pallor or clubbing. PULMONARY:  Unlabored, regular breathing. Mild rhonchi bilaterally A&P with bronchitic cough. No accessory muscle use.  GI: BS present and normoactive. Soft, non-tender to palpation. No organomegaly or masses detected.  MSK: No erythema, warmth or tenderness. Cap refil <2 sec all extrem. No deformities or joint swelling noted.  Neuro: A/Ox3. No focal deficits noted.   Skin: Warm, no lesions or rashe Psych: Normal affect and behavior. Judgement and thought  content appropriate.     Lab Results:  CBC    Component Value Date/Time   WBC 11.7 (H) 03/25/2023 0157   RBC 4.23 03/25/2023 0157   HGB 13.2 03/25/2023 0157   HCT 40.0 03/25/2023 0157   PLT 244 03/25/2023 0157   MCV 94.6 03/25/2023 0157   MCH 31.2 03/25/2023 0157   MCHC 33.0 03/25/2023 0157   RDW 12.6 03/25/2023 0157   LYMPHSABS 1.0 03/22/2023 0401   MONOABS 0.4 03/22/2023 0401   EOSABS 0.0 03/22/2023 0401   BASOSABS 0.0 03/22/2023 0401    BMET    Component Value Date/Time   NA 135 03/25/2023 0157   K 3.6 03/25/2023 0157   CL 103 03/25/2023 0157   CO2 24 03/25/2023 0157   GLUCOSE 155 (H) 03/25/2023 0157   BUN 20 03/25/2023 0157   CREATININE 1.42 (H) 03/25/2023 0157   CALCIUM 8.7 (L) 03/25/2023 0157   GFRNONAA 38 (L) 03/25/2023 0157   GFRAA  02/09/2010 1100    >60        The eGFR has been calculated using the MDRD equation. This calculation has not been validated in all clinical situations. eGFR's persistently <60 mL/min signify possible Chronic Kidney Disease.    BNP No results  found for: "BNP"   Imaging:  No results found.  Administration History     None           No data to display          Lab Results  Component Value Date   NITRICOXIDE 26 01/01/2023        Assessment & Plan:   Acute bronchitis with COPD (HCC) Possibly triggered by viral illness vs allergies. Suspect a component of upper airway irritation from postnasal drainage contributing to cough. Exhaled nitric oxide  testing is normal; given bronchitis like illness, will challenge her with prednisone  burst and empiric azithromycin . Supportive care and cough control measures advised. Plan to obtain imaging if no improvement or symptoms worsen. Check allergen panel today to assess for allergic component. Encouraged to continue maintenance regimen. Action plan in place.  Patient Instructions  -Continue Albuterol  inhaler 2 puffs or 3 mL every 6 hours as needed for shortness of breath or wheezing. Notify if symptoms persist despite rescue inhaler/neb use.  -Continue Flonase  nasal spray 2 sprays each nostril daily for nasal congestion/drainage  -Continue Trelegy 1 puff daily. Brush tongue and rinse mouth afterwards  -Continue guaifenesin  600 mg Twice daily for cough/congestion    Use your flutter valve after your neb treatments as needed for cough/congestion  Azithromycin  - take 2 tabs on day one then 1 tab daily for four additional days. Take with food Prednisone  40 mg daily for 5 days. Take in AM with food Benzonatate  1 capsule Three times a day as needed for cough Promethazine  DM cough syrup 5 mL every 6 hours as needed for cough. Do not drive after taking. May cause drowsiness.  Ipratropium nasal spray up to four times a day as needed for nasal congestion/postnasal drip   Labs today - allergen panel    Follow up in 3 months with Dr. Diania Fortes or Gina Lagos. If symptoms do not improve or worsen, please contact office for sooner follow up or seek emergency care.    Upper airway  cough syndrome See above  Former smoker Former heavy smoker; 75 pack year hx. Quit 2012. Will refer to lung cancer screening program. She may still qualify despite age, pending plan.      I  spent 35 minutes of dedicated to the care of this patient on the date of this encounter to include pre-visit review of records, face-to-face time with the patient discussing conditions above, post visit ordering of testing, clinical documentation with the electronic health record, making appropriate referrals as documented, and communicating necessary findings to members of the patients care team.  Roetta Clarke, NP 11/21/2023  Pt aware and understands NP's role.

## 2023-11-21 NOTE — Assessment & Plan Note (Signed)
 Former heavy smoker; 75 pack year hx. Quit 2012. Will refer to lung cancer screening program. She may still qualify despite age, pending plan.

## 2023-11-21 NOTE — Assessment & Plan Note (Signed)
 See above

## 2023-11-21 NOTE — Assessment & Plan Note (Signed)
 Possibly triggered by viral illness vs allergies. Suspect a component of upper airway irritation from postnasal drainage contributing to cough. Exhaled nitric oxide  testing is normal; given bronchitis like illness, will challenge her with prednisone  burst and empiric azithromycin . Supportive care and cough control measures advised. Plan to obtain imaging if no improvement or symptoms worsen. Check allergen panel today to assess for allergic component. Encouraged to continue maintenance regimen. Action plan in place.  Patient Instructions  -Continue Albuterol  inhaler 2 puffs or 3 mL every 6 hours as needed for shortness of breath or wheezing. Notify if symptoms persist despite rescue inhaler/neb use.  -Continue Flonase  nasal spray 2 sprays each nostril daily for nasal congestion/drainage  -Continue Trelegy 1 puff daily. Brush tongue and rinse mouth afterwards  -Continue guaifenesin  600 mg Twice daily for cough/congestion    Use your flutter valve after your neb treatments as needed for cough/congestion  Azithromycin  - take 2 tabs on day one then 1 tab daily for four additional days. Take with food Prednisone  40 mg daily for 5 days. Take in AM with food Benzonatate  1 capsule Three times a day as needed for cough Promethazine  DM cough syrup 5 mL every 6 hours as needed for cough. Do not drive after taking. May cause drowsiness.  Ipratropium nasal spray up to four times a day as needed for nasal congestion/postnasal drip   Labs today - allergen panel    Follow up in 3 months with Dr. Diania Fortes or Gina Lagos. If symptoms do not improve or worsen, please contact office for sooner follow up or seek emergency care.

## 2023-11-21 NOTE — Patient Instructions (Addendum)
-  Continue Albuterol  inhaler 2 puffs or 3 mL every 6 hours as needed for shortness of breath or wheezing. Notify if symptoms persist despite rescue inhaler/neb use.  -Continue Flonase  nasal spray 2 sprays each nostril daily for nasal congestion/drainage  -Continue Trelegy 1 puff daily. Brush tongue and rinse mouth afterwards  -Continue guaifenesin  600 mg Twice daily for cough/congestion    Use your flutter valve after your neb treatments as needed for cough/congestion  Azithromycin  - take 2 tabs on day one then 1 tab daily for four additional days. Take with food Prednisone  40 mg daily for 5 days. Take in AM with food Benzonatate  1 capsule Three times a day as needed for cough Promethazine  DM cough syrup 5 mL every 6 hours as needed for cough. Do not drive after taking. May cause drowsiness.  Ipratropium nasal spray up to four times a day as needed for nasal congestion/postnasal drip   Labs today - allergen panel    Follow up in 3 months with Dr. Diania Fortes or Gina Lagos. If symptoms do not improve or worsen, please contact office for sooner follow up or seek emergency care.

## 2023-11-26 LAB — ALLERGEN PANEL (27) + IGE
Alternaria Alternata IgE: 0.17 kU/L — AB
Aspergillus Fumigatus IgE: <0.1 kU/L
Bahia Grass IgE: <0.1 kU/L
Bermuda Grass IgE: <0.1 kU/L
Cat Dander IgE: <0.1 kU/L
Cedar, Mountain IgE: <0.1 kU/L
Cladosporium Herbarum IgE: <0.1 kU/L
Cocklebur IgE: 0.1 kU/L — AB
Cockroach, American IgE: <0.1 kU/L
Common Silver Birch IgE: <0.1 kU/L
D Farinae IgE: 1.25 kU/L — AB
D Pteronyssinus IgE: 0.98 kU/L — AB
Dog Dander IgE: <0.1 kU/L
Elm, American IgE: <0.1 kU/L
Hickory, White IgE: <0.1 kU/L
IgE (Immunoglobulin E), Serum: 78 [IU]/mL (ref 6–495)
Johnson Grass IgE: <0.1 kU/L
Kentucky Bluegrass IgE: <0.1 kU/L
Maple/Box Elder IgE: <0.1 kU/L
Mucor Racemosus IgE: <0.1 kU/L
Oak, White IgE: <0.1 kU/L
Penicillium Chrysogen IgE: <0.1 kU/L
Pigweed, Rough IgE: <0.1 kU/L
Plantain, English IgE: <0.1 kU/L
Ragweed, Short IgE: 0.68 kU/L — AB
Setomelanomma Rostrat: <0.1 kU/L
Timothy Grass IgE: <0.1 kU/L
White Mulberry IgE: <0.1 kU/L

## 2023-11-28 ENCOUNTER — Ambulatory Visit: Payer: Self-pay | Admitting: Nurse Practitioner

## 2023-11-28 NOTE — Progress Notes (Signed)
 Positive to dust, ragweed, cocklebur, and fungus in the environment. Never seen allergy. Please place referral.

## 2023-11-30 ENCOUNTER — Other Ambulatory Visit: Payer: Self-pay | Admitting: Family Medicine

## 2023-11-30 NOTE — Progress Notes (Signed)
 To allergy

## 2023-12-03 ENCOUNTER — Other Ambulatory Visit: Payer: Self-pay

## 2023-12-03 DIAGNOSIS — J432 Centrilobular emphysema: Secondary | ICD-10-CM

## 2023-12-03 DIAGNOSIS — J309 Allergic rhinitis, unspecified: Secondary | ICD-10-CM

## 2023-12-23 ENCOUNTER — Other Ambulatory Visit: Payer: Self-pay | Admitting: Family Medicine

## 2023-12-31 NOTE — Progress Notes (Unsigned)
 New Patient Note  RE: [name] MRN: [id number] DOB: Dec 18, 1945 Date of Office Visit: 01/01/2024  Consult requested by: Malachy Comer GAILS, NP Primary care provider: Micheal Wolm ORN, MD  Chief Complaint: No chief complaint on file.  History of Present Illness: I had the pleasure of seeing [name] for initial evaluation at the [facility] on 01/01/2024. She is a 78 y.o. female, who is referred here by Micheal Wolm ORN, MD for the evaluation of allergic rhinitis.  Discussed the use of AI scribe software for clinical note transcription with the patient, who gave verbal consent to proceed.  History of Present Illness             She reports symptoms of ***. Symptoms have been going on for *** years. The symptoms are present *** all year around with worsening in ***. Other triggers include exposure to ***. Anosmia: ***. Headache: ***. She has used *** with ***fair improvement in symptoms. Sinus infections: ***. Previous work up includes: ***. Previous ENT evaluation: ***. Previous sinus imaging: ***. History of nasal polyps: ***. Last eye exam: ***. History of reflux: ***.  Assessment and Plan: [name] is a 78 y.o. female with: ***  Assessment and Plan               No follow-ups on file.  No orders of the defined types were placed in this encounter.  Lab Orders  No laboratory test(s) ordered today    Other allergy screening: Asthma: {Blank single:19197::yes,no} Rhino conjunctivitis: {Blank single:19197::yes,no} Food allergy: {Blank single:19197::yes,no} Medication allergy: {Blank single:19197::yes,no} Hymenoptera allergy: {Blank single:19197::yes,no} Urticaria: {Blank single:19197::yes,no} Eczema:{Blank single:19197::yes,no} History of recurrent infections suggestive of immunodeficency: {Blank single:19197::yes,no}  Diagnostics: Spirometry:  Tracings reviewed. Her effort: {Blank  single:19197::Good reproducible efforts.,It was hard to get consistent efforts and there is a question as to whether this reflects a maximal maneuver.,Poor effort, data can not be interpreted.} FVC: ***L FEV1: ***L, ***% predicted FEV1/FVC ratio: ***% Interpretation: {Blank single:19197::Spirometry consistent with mild obstructive disease,Spirometry consistent with moderate obstructive disease,Spirometry consistent with severe obstructive disease,Spirometry consistent with possible restrictive disease,Spirometry consistent with mixed obstructive and restrictive disease,Spirometry uninterpretable due to technique,Spirometry consistent with normal pattern,No overt abnormalities noted given today's efforts}.  Please see scanned spirometry results for details.  Skin Testing: {Blank single:19197::Select foods,Environmental allergy panel,Environmental allergy panel and select foods,Food allergy panel,None,Deferred due to recent antihistamines use}. *** Results discussed with patient/family.   Past Medical History: Patient Active Problem List   Diagnosis Date Noted  . Upper airway cough syndrome 11/21/2023  . Acute bronchitis with COPD (HCC) 11/16/2022  . Acute sinusitis 10/30/2022  . Cholecystitis, acute with cholelithiasis 07/18/2022  . Acute cholecystitis 07/15/2022  . Allergic rhinitis 05/02/2021  . Bladder cancer (HCC) 08/30/2020  . Pain of left hand 08/30/2020  . Atrophic vaginitis 11/25/2018  . Transient insomnia 05/03/2015  . Distal radius fracture, right 11/03/2014  . Acute bronchitis 06/14/2011  . 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 HEMATURIA UNSPECIFIED 11/03/2009  . HYPERLIPIDEMIA 09/23/2009  . Former smoker 09/23/2009  . Osteoporosis 10/05/2008  . Disorder of skeletal system 10/05/2008  . Depression, recurrent (HCC) 09/17/2008   Past Medical History:  Diagnosis Date  . Anxiety    aniety attacks  . Bladder tumor   . Colon polyps   . COPD  (chronic obstructive pulmonary disease) (HCC)    09-08-2020 acute flare up with cough finished antibiotics and steroids cough resolved now  . Depression   . Hay fever  allergies  . History of kidney stones   . Migraines    not in 40 years  . Wears glasses    Past Surgical History: Past Surgical History:  Procedure Laterality Date  . BILIARY STENT PLACEMENT  07/25/2022   Procedure: BILIARY STENT PLACEMENT;  Surgeon: Rollin Dover, MD;  Location: [facility] ENDOSCOPY;  Service: Gastroenterology;;  . BREAST BIOPSY Right 1990  . caesarean secion  1974, 1977, 1984  . CESAREAN SECTION  [phone]  . CHOLECYSTECTOMY N/A 07/17/2022   Procedure: LAPAROSCOPIC SUBTOTAL CHOLECYSTECTOMY;  Surgeon: Curvin Deward MOULD, MD;  Location: [facility] OR;  Service: General;  Laterality: N/A;  . COLONOSCOPY W/ POLYPECTOMY  last done 2016    x 3   . CYSTOSCOPY N/A 09/21/2020   Procedure: CYSTOSCOPY;  Surgeon: Elisabeth Valli BIRCH, MD;  Location[facility];  Service: Urology;  Laterality: N/A;  . CYSTOSCOPY W/ RETROGRADES Bilateral 02/04/2020   Procedure: CYSTOSCOPY WITH RETROGRADE PYELOGRAM;  Surgeon: Carolee Sherwood BIRCH MOULD, MD;  Location: [facility];  Service: Urology;  Laterality: Bilateral;  . CYSTOSCOPY WITH RETROGRADE PYELOGRAM, URETEROSCOPY AND STENT PLACEMENT Left 03/26/2023   Procedure: CYSTOSCOPY, URETEROSCOPY AND STONE REMOVAL;  Surgeon: Renda Glance, MD;  Location: [facility] ORS;  Service: Urology;  Laterality: Left;  . ENDOSCOPIC RETROGRADE CHOLANGIOPANCREATOGRAPHY (ERCP) WITH PROPOFOL  N/A 02/02/2023   Procedure: ENDOSCOPIC RETROGRADE CHOLANGIOPANCREATOGRAPHY (ERCP) WITH PROPOFOL ;  Surgeon: Rollin Dover, MD;  Location: [facility] ENDOSCOPY;  Service: Gastroenterology;  Laterality: N/A;  . ERCP N/A 07/22/2022   Procedure: ENDOSCOPIC RETROGRADE CHOLANGIOPANCREATOGRAPHY (ERCP);  Surgeon: Rosalie Kitchens, MD;  Location: [facility] ENDOSCOPY;  Service: Gastroenterology;  Laterality: N/A;  . ERCP N/A 07/25/2022    Procedure: ENDOSCOPIC RETROGRADE CHOLANGIOPANCREATOGRAPHY (ERCP);  Surgeon: Rollin Dover, MD;  Location: [facility] ENDOSCOPY;  Service: Gastroenterology;  Laterality: N/A;  . FINGER FRACTURE SURGERY Right 07/11/2022   5th digit has a screw  . OPEN REDUCTION INTERNAL FIXATION (ORIF) DISTAL RADIAL FRACTURE Right 11/03/2014   Procedure: OPEN REDUCTION INTERNAL FIXATION (ORIF) RIGHT DISTAL RADIAL FRACTURE WITH REPAIR AND RECONSTRUCTION;  Surgeon: Elsie Mussel, MD;  Location: [facility] OR;  Service: Orthopedics;  Laterality: Right;  . PANCREATIC STENT PLACEMENT  07/22/2022   Procedure: PANCREATIC STENT PLACEMENT;  Surgeon: Rosalie Kitchens, MD;  Location: [facility] ENDOSCOPY;  Service: Gastroenterology;;  . ANNETT  07/22/2022   Procedure: ANNETT;  Surgeon: Rosalie Kitchens, MD;  Location: [facility] ENDOSCOPY;  Service: Gastroenterology;;  . CLEDA REMOVAL  02/02/2023   Procedure: STENT REMOVAL;  Surgeon: Rollin Dover, MD;  Location: [facility] ENDOSCOPY;  Service: Gastroenterology;;  . TRANSURETHRAL RESECTION OF BLADDER TUMOR WITH MITOMYCIN -C N/A 02/04/2020   Procedure: TRANSURETHRAL RESECTION OF BLADDER TUMOR WITH POST OPERATIVE GEMCITABINE ;  Surgeon: Carolee Sherwood BIRCH MOULD, MD;  Location[facility];  Service: Urology;  Laterality: N/A;  . TRANSURETHRAL RESECTION OF BLADDER TUMOR WITH MITOMYCIN -C N/A 09/21/2020   Procedure: CYSTOSCOPY WITH BLADDER FULGARATION WITH POST OP GEMCITABINE ;  Surgeon: Elisabeth Valli BIRCH, MD;  Location: [facility];  Service: Urology;  Laterality: N/A;  1 HR   Medication List:  Current Outpatient Medications  Medication Sig Dispense Refill  . albuterol  (PROVENTIL ) (2.5 MG/3ML) 0.083% nebulizer solution Take 3 mLs (2.5 mg total) by nebulization every 6 (six) hours as needed for wheezing or shortness of breath. 75 mL 12  . albuterol  (VENTOLIN  HFA) 108 (90 Base) MCG/ACT inhaler Inhale 2 puffs into the lungs every 6 (six) hours as needed for wheezing or shortness of breath. 8 g  2  . azithromycin  (ZITHROMAX ) 250 MG  tablet Take 2 tablets on day one then take 1 tablet daily for four additional days 6 tablet 0  . benzonatate  (TESSALON ) 200 MG capsule Take 1 capsule (200 mg total) by mouth 3 (three) times daily as needed for cough. 30 capsule 1  . Dextromethorphan-guaiFENesin  (MUCINEX  DM) 30-600 MG TB12 Take 1 tablet by mouth in the morning and at bedtime.    . FLUoxetine  (PROZAC ) 20 MG capsule TAKE 1 CAPSULE BY MOUTH EVERY DAY 30 capsule 0  . fluticasone  (FLONASE ) 50 MCG/ACT nasal spray SPRAY 1 SPRAY INTO BOTH NOSTRILS DAILY. 16 mL 2  . Fluticasone -Umeclidin-Vilant (TRELEGY ELLIPTA ) 100-62.5-25 MCG/ACT AEPB TAKE 1 PUFF BY MOUTH EVERY DAY 60 each 11  . ipratropium (ATROVENT ) 0.06 % nasal spray Place 2 sprays into both nostrils 4 (four) times daily. 15 mL 12  . promethazine -dextromethorphan (PROMETHAZINE -DM) 6.25-15 MG/5ML syrup Take 5 mLs by mouth 4 (four) times daily as needed for cough. 180 mL 0  . TYLENOL  500 MG tablet Take 500 mg by mouth every 6 (six) hours as needed for mild pain or headache.     No current facility-administered medications for this visit.   Allergies: Allergies  Allergen Reactions  . Aspirin Swelling and Other (See Comments)    Vasculitis    Social History: Social History   Socioeconomic History  . Marital status: Married    Spouse name: Not on file  . Number of children: 3  . Years of education: 19  . Highest education level: Some college, no degree  Occupational History    Comment: Retired   Tobacco Use  . Smoking status: Former    Current packs/day: 0.00    Average packs/day: 1.5 packs/day for 50.0 years (75.0 ttl pk-yrs)    Types: Cigarettes    Start date: 21    Quit date: 2012    Years since quitting: 13.5  . Smokeless tobacco: Never  . Tobacco comments:    Former smoker  Advertising account planner  . Vaping status: Never Used  Substance and Sexual Activity  . Alcohol use: Yes    Alcohol/week: 5.0 standard drinks of alcohol    Types:  5 Glasses of wine per week  . Drug use: No  . Sexual activity: Not on file  Other Topics Concern  . Not on file  Social History Narrative   Married   3 children   Social Drivers of Health   Financial Resource Strain: Low Risk  (08/09/2022)   Overall Financial Resource Strain (CARDIA)   . Difficulty of Paying Living Expenses: Not hard at all  Food Insecurity: No Food Insecurity (08/09/2022)   Hunger Vital Sign   . Worried About Programme researcher, broadcasting/film/video in the Last Year: Never true   . Ran Out of Food in the Last Year: Never true  Transportation Needs: No Transportation Needs (08/09/2022)   PRAPARE - Transportation   . Lack of Transportation (Medical): No   . Lack of Transportation (Non-Medical): No  Physical Activity: Inactive (08/09/2022)   Exercise Vital Sign   . Days of Exercise per Week: 0 days   . Minutes of Exercise per Session: 0 min  Stress: No Stress Concern Present (08/09/2022)   [employer] of Occupational Health - Occupational Stress Questionnaire   . Feeling of Stress : Not at all  Social Connections: Moderately Isolated (08/09/2022)   Social Connection and Isolation Panel   . Frequency of Communication with Friends and Family: More than three times a week   . Frequency of Social  Gatherings with Friends and Family: More than three times a week   . Attends Religious Services: Never   . Active Member of Clubs or Organizations: No   . Attends Banker Meetings: Never   . Marital Status: Married   Lives in a ***. Smoking: *** Occupation: ***  Environmental HistorySurveyor, minerals in the house: Network engineer in the family room: {Blank single:19197::yes,no} Carpet in the bedroom: {Blank single:19197::yes,no} Heating: {Blank single:19197::electric,gas,heat pump} Cooling: {Blank single:19197::central,window,heat pump} Pet: {Blank single:19197::yes ***,no}  Family History: Family History  Problem  Relation Age of Onset  . Arthritis Other   . Stroke Other   . Hypertension Other   . Depression Other   . Migraines Other   . Colon polyps Other   . Breast cancer Neg Hx   . BRCA 1/2 Neg Hx    Problem                               Relation Asthma                                   *** Eczema                                *** Food allergy                          *** Allergic rhino conjunctivitis     ***  Review of Systems  Constitutional:  Negative for appetite change, chills, fever and unexpected weight change.  HENT:  Negative for congestion and rhinorrhea.   Eyes:  Negative for itching.  Respiratory:  Negative for cough, chest tightness, shortness of breath and wheezing.   Cardiovascular:  Negative for chest pain.  Gastrointestinal:  Negative for abdominal pain.  Genitourinary:  Negative for difficulty urinating.  Skin:  Negative for rash.  Neurological:  Negative for headaches.    Objective: There were no vitals taken for this visit. There is no height or weight on file to calculate BMI. Physical Exam Vitals and nursing note reviewed.  Constitutional:      Appearance: Normal appearance. She is well-developed.  HENT:     Head: Normocephalic and atraumatic.     Right Ear: Tympanic membrane and external ear normal.     Left Ear: Tympanic membrane and external ear normal.     Nose: Nose normal.     Mouth/Throat:     Mouth: Mucous membranes are moist.     Pharynx: Oropharynx is clear.  Eyes:     Conjunctiva/sclera: Conjunctivae normal.  Cardiovascular:     Rate and Rhythm: Normal rate and regular rhythm.     Heart sounds: Normal heart sounds. No murmur heard.    No friction rub. No gallop.  Pulmonary:     Effort: Pulmonary effort is normal.     Breath sounds: Normal breath sounds. No wheezing, rhonchi or rales.  Musculoskeletal:     Cervical back: Neck supple.  Skin:    General: Skin is warm.     Findings: No rash.  Neurological:     Mental Status: She is  alert and oriented to person, place, and time.  Psychiatric:        Behavior: Behavior normal.   The plan was reviewed  with the patient/family, and all questions/concerned were addressed.  It was my pleasure to see Makenzye today and participate in her care. Please feel free to contact me with any questions or concerns.  Sincerely,  Orlan Cramp, DO Allergy & Immunology  [facility]  [facility] office: [phone] [facility] office: [phone]

## 2024-01-01 ENCOUNTER — Encounter: Payer: Self-pay | Admitting: Allergy

## 2024-01-01 ENCOUNTER — Other Ambulatory Visit: Payer: Self-pay

## 2024-01-01 ENCOUNTER — Ambulatory Visit: Payer: Self-pay | Admitting: Allergy

## 2024-01-01 VITALS — BP 118/68 | HR 74 | Temp 98.3°F | Resp 18 | Ht 67.0 in | Wt 168.4 lb

## 2024-01-01 DIAGNOSIS — J3089 Other allergic rhinitis: Secondary | ICD-10-CM | POA: Diagnosis not present

## 2024-01-01 DIAGNOSIS — B999 Unspecified infectious disease: Secondary | ICD-10-CM

## 2024-01-01 DIAGNOSIS — J449 Chronic obstructive pulmonary disease, unspecified: Secondary | ICD-10-CM

## 2024-01-01 NOTE — Patient Instructions (Addendum)
 Breathing Managed by pulmonology.  Daily controller medication(s): continue Trelegy 100mcg 1 puff once a day as before and rinse mouth after each use. Consider Nucala which got approval for COPD recently.  Get cbc diff to see if she qualifies.  May use albuterol  rescue inhaler 2 puffs or nebulizer every 4 to 6 hours as needed for shortness of breath, chest tightness, coughing, and wheezing. May use albuterol  rescue inhaler 2 puffs 5 to 15 minutes prior to strenuous physical activities. Monitor frequency of use - if you need to use it more than twice per week on a consistent basis let us  know.  Breathing control goals:  Full participation in all desired activities (may need albuterol  before activity) Albuterol  use two times or less a week on average (not counting use with activity) Cough interfering with sleep two times or less a month Oral steroids no more than once a year No hospitalizations   Infections Keep track of infections and antibiotics use. Will get bloodwork next to look at immune system.  Recommend annual flu vaccine in the fall.   Environmental allergies 2025 labs - positive to dust mites, ragweed, borderline to mold and weeds. Start environmental control measures as below. Use over the counter antihistamines such as Zyrtec (cetirizine), Claritin (loratadine), Allegra  (fexofenadine ), or Xyzal (levocetirizine) daily as needed. May switch antihistamines every few months. Use Flonase  (fluticasone ) nasal spray 1-2 sprays per nostril once a day as needed for nasal congestion.  Nasal saline spray (i.e., Simply Saline) or nasal saline lavage (i.e., NeilMed) is recommended as needed and prior to medicated nasal sprays.  Return in about 3 months (around 04/02/2024). Or sooner if needed.    Reducing Pollen Exposure Pollen seasons: trees (spring), grass (summer) and ragweed/weeds (fall). Keep windows closed in your home and car to lower pollen exposure.  Install air conditioning in  the bedroom and throughout the house if possible.  Avoid going out in dry windy days - especially early morning. Pollen counts are highest between 5 - 10 AM and on dry, hot and windy days.  Save outside activities for late afternoon or after a heavy rain, when pollen levels are lower.  Avoid mowing of grass if you have grass pollen allergy. Be aware that pollen can also be transported indoors on people and pets.  Dry your clothes in an automatic dryer rather than hanging them outside where they might collect pollen.  Rinse hair and eyes before bedtime.  Control of House Dust Mite Allergen Dust mite allergens are a common trigger of allergy and asthma symptoms. While they can be found throughout the house, these microscopic creatures thrive in warm, humid environments such as bedding, upholstered furniture and carpeting. Because so much time is spent in the bedroom, it is essential to reduce mite levels there.  Encase pillows, mattresses, and box springs in special allergen-proof fabric covers or airtight, zippered plastic covers.  Bedding should be washed weekly in hot water (130 F) and dried in a hot dryer. Allergen-proof covers are available for comforters and pillows that can't be regularly washed.  Wash the allergy-proof covers every few months. Minimize clutter in the bedroom. Keep pets out of the bedroom.  Keep humidity less than 50% by using a dehumidifier or air conditioning. You can buy a humidity measuring device called a hygrometer to monitor this.  If possible, replace carpets with hardwood, linoleum, or washable area rugs. If that's not possible, vacuum frequently with a vacuum that has a HEPA filter. Remove all upholstered furniture  and non-washable window drapes from the bedroom. Remove all non-washable stuffed toys from the bedroom.  Wash stuffed toys weekly.  Mold Control Mold and fungi can grow on a variety of surfaces provided certain temperature and moisture conditions  exist.  Outdoor molds grow on plants, decaying vegetation and soil. The major outdoor mold, Alternaria and Cladosporium, are found in very high numbers during hot and dry conditions. Generally, a late summer - fall peak is seen for common outdoor fungal spores. Rain will temporarily lower outdoor mold spore count, but counts rise rapidly when the rainy period ends. The most important indoor molds are Aspergillus and Penicillium. Dark, humid and poorly ventilated basements are ideal sites for mold growth. The next most common sites of mold growth are the bathroom and the kitchen. Outdoor (Seasonal) Mold Control Use air conditioning and keep windows closed. Avoid exposure to decaying vegetation. Avoid leaf raking. Avoid grain handling. Consider wearing a face mask if working in moldy areas.  Indoor (Perennial) Mold Control  Maintain humidity below 50%. Get rid of mold growth on hard surfaces with water, detergent and, if necessary, 5% bleach (do not mix with other cleaners). Then dry the area completely. If mold covers an area more than 10 square feet, consider hiring an indoor environmental professional. For clothing, washing with soap and water is best. If moldy items cannot be cleaned and dried, throw them away. Remove sources e.g. contaminated carpets. Repair and seal leaking roofs or pipes. Using dehumidifiers in damp basements may be helpful, but empty the water and clean units regularly to prevent mildew from forming. All rooms, especially basements, bathrooms and kitchens, require ventilation and cleaning to deter mold and mildew growth. Avoid carpeting on concrete or damp floors, and storing items in damp areas.

## 2024-01-09 ENCOUNTER — Ambulatory Visit: Payer: Self-pay | Admitting: Allergy

## 2024-01-09 LAB — CBC WITH DIFFERENTIAL/PLATELET
Basophils Absolute: 0.1 x10E3/uL (ref 0.0–0.2)
Basos: 1 %
EOS (ABSOLUTE): 0.1 x10E3/uL (ref 0.0–0.4)
Eos: 1 %
Hematocrit: 46.3 % (ref 34.0–46.6)
Hemoglobin: 15 g/dL (ref 11.1–15.9)
Immature Grans (Abs): 0 x10E3/uL (ref 0.0–0.1)
Immature Granulocytes: 0 %
Lymphocytes Absolute: 1.5 x10E3/uL (ref 0.7–3.1)
Lymphs: 21 %
MCH: 30.5 pg (ref 26.6–33.0)
MCHC: 32.4 g/dL (ref 31.5–35.7)
MCV: 94 fL (ref 79–97)
Monocytes Absolute: 0.5 x10E3/uL (ref 0.1–0.9)
Monocytes: 7 %
Neutrophils Absolute: 5.2 x10E3/uL (ref 1.4–7.0)
Neutrophils: 70 %
Platelets: 277 x10E3/uL (ref 150–450)
RBC: 4.91 x10E6/uL (ref 3.77–5.28)
RDW: 12.8 % (ref 11.7–15.4)
WBC: 7.4 x10E3/uL (ref 3.4–10.8)

## 2024-01-09 LAB — STREP PNEUMONIAE 23 SEROTYPES IGG
Pneumo Ab Type 1*: 0.9 ug/mL — ABNORMAL LOW (ref >1.3)
Pneumo Ab Type 12 (12F)*: 0.3 ug/mL — ABNORMAL LOW (ref >1.3)
Pneumo Ab Type 14*: 2.2 ug/mL (ref >1.3)
Pneumo Ab Type 17 (17F)*: 0.4 ug/mL — ABNORMAL LOW (ref >1.3)
Pneumo Ab Type 19 (19F)*: 4 ug/mL (ref >1.3)
Pneumo Ab Type 2*: <0.2 ug/mL — ABNORMAL LOW (ref >1.3)
Pneumo Ab Type 20*: 0.7 ug/mL — ABNORMAL LOW (ref >1.3)
Pneumo Ab Type 22 (22F)*: 0.5 ug/mL — ABNORMAL LOW (ref >1.3)
Pneumo Ab Type 23 (23F)*: 10.7 ug/mL (ref >1.3)
Pneumo Ab Type 26 (6B)*: 1.4 ug/mL (ref >1.3)
Pneumo Ab Type 3*: 0.3 ug/mL — ABNORMAL LOW (ref >1.3)
Pneumo Ab Type 34 (10A)*: 2.3 ug/mL (ref >1.3)
Pneumo Ab Type 4*: 1.7 ug/mL (ref >1.3)
Pneumo Ab Type 43 (11A)*: 0.4 ug/mL — ABNORMAL LOW (ref >1.3)
Pneumo Ab Type 5*: 2.8 ug/mL (ref >1.3)
Pneumo Ab Type 51 (7F)*: 2.9 ug/mL (ref >1.3)
Pneumo Ab Type 54 (15B)*: 5.4 ug/mL (ref >1.3)
Pneumo Ab Type 56 (18C)*: 2.8 ug/mL (ref >1.3)
Pneumo Ab Type 57 (19A)*: 1.6 ug/mL (ref >1.3)
Pneumo Ab Type 68 (9V)*: 4.9 ug/mL (ref >1.3)
Pneumo Ab Type 70 (33F)*: 0.8 ug/mL — ABNORMAL LOW (ref >1.3)
Pneumo Ab Type 8*: 1 ug/mL — ABNORMAL LOW (ref >1.3)
Pneumo Ab Type 9 (9N)*: 2.8 ug/mL (ref >1.3)

## 2024-01-09 LAB — IGG, IGA, IGM
IgA/Immunoglobulin A, Serum: 264 mg/dL (ref 64–422)
IgG (Immunoglobin G), Serum: 750 mg/dL (ref 586–1602)
IgM (Immunoglobulin M), Srm: 101 mg/dL (ref 26–217)

## 2024-01-09 LAB — DIPHTHERIA / TETANUS ANTIBODY PANEL
Diphtheria Ab: 0.38 [IU]/mL (ref <0.10)
Tetanus Ab, IgG: 0.37 [IU]/mL (ref <0.10)

## 2024-01-14 ENCOUNTER — Other Ambulatory Visit: Payer: Self-pay | Admitting: Allergy

## 2024-01-14 DIAGNOSIS — B999 Unspecified infectious disease: Secondary | ICD-10-CM

## 2024-01-14 MED ORDER — PNEUMOVAX 23 25 MCG/0.5ML IJ SOLN: 0.5000 mL | Freq: Once | INTRAMUSCULAR | 0 refills | Status: AC

## 2024-01-14 NOTE — Addendum Note (Signed)
 Addended by: Pelham Hennick M on: 01/14/2024 11:46 AM   Modules accepted: Orders

## 2024-01-14 NOTE — Progress Notes (Signed)
 Patient called to request pneumovax Rx to [facility] pharmacy. Rx sent in.

## 2024-01-17 ENCOUNTER — Other Ambulatory Visit: Payer: Self-pay | Admitting: Family Medicine

## 2024-01-21 ENCOUNTER — Other Ambulatory Visit: Payer: Self-pay | Admitting: Family Medicine

## 2024-02-11 ENCOUNTER — Ambulatory Visit (INDEPENDENT_AMBULATORY_CARE_PROVIDER_SITE_OTHER): Admitting: Family Medicine

## 2024-02-11 ENCOUNTER — Encounter: Payer: Self-pay | Admitting: Family Medicine

## 2024-02-11 VITALS — BP 120/70 | HR 74 | Temp 97.6°F | Wt 166.6 lb

## 2024-02-11 DIAGNOSIS — H9191 Unspecified hearing loss, right ear: Secondary | ICD-10-CM | POA: Diagnosis not present

## 2024-02-11 DIAGNOSIS — J449 Chronic obstructive pulmonary disease, unspecified: Secondary | ICD-10-CM

## 2024-02-11 DIAGNOSIS — F339 Major depressive disorder, recurrent, unspecified: Secondary | ICD-10-CM | POA: Diagnosis not present

## 2024-02-11 DIAGNOSIS — B999 Unspecified infectious disease: Secondary | ICD-10-CM | POA: Diagnosis not present

## 2024-02-11 MED ORDER — FLUOXETINE HCL 20 MG PO CAPS: 20.0000 mg | ORAL_CAPSULE | Freq: Every day | ORAL | 3 refills | Status: AC

## 2024-02-11 NOTE — Progress Notes (Signed)
 Established Patient Office Visit  Subjective   Patient ID: [name], female    DOB: 04-May-1946  Age: 78 y.o. MRN: [id number]  Chief Complaint  Patient presents with   Medical Management of Chronic Issues    HPI   [name] is seen for medical follow-up.  Overall, she is doing well but she has had very stressful year.  Her husband has been diagnosed with prostate cancer, esophageal cancer, and has congestive heart failure.  Husband currently has a feeding tube.  He has scheduled follow-up procedures including EGD and PET scan soon.  This has naturally been stressful for her.  Regarding her bladder cancer she is getting every 27-month follow-ups and has been clear for 5 years now.  She does have history of some recurrent depression which is stable on fluoxetine  20 mg daily.  Requesting refills.  Requesting right ear recheck.  She feels like her hearing subjectively may be off on that side.  She wants to make sure no cerumen.  Has been followed by pulmonary for COPD.  Currently on Trelegy and feels like that is helping.  She has had RSV vaccine and plans to get COVID and influenza this fall.  Past Medical History:  Diagnosis Date   Anxiety    aniety attacks   Asthma    Bladder tumor    Colon polyps    COPD (chronic obstructive pulmonary disease) (HCC)    09-08-2020 acute flare up with cough finished antibiotics and steroids cough resolved now   Depression    Hay fever    allergies   History of kidney stones    Migraines    not in 40 years   Wears glasses    Past Surgical History:  Procedure Laterality Date   BILIARY STENT PLACEMENT  07/25/2022   Procedure: BILIARY STENT PLACEMENT;  Surgeon: Rollin Dover, MD;  Location: [facility] ENDOSCOPY;  Service: Gastroenterology;;   BREAST BIOPSY Right 1990   caesarean secion  1974, 1977, 1984   CESAREAN SECTION  [phone]   CHOLECYSTECTOMY N/A 07/17/2022   Procedure: LAPAROSCOPIC SUBTOTAL CHOLECYSTECTOMY;  Surgeon: Curvin Deward MOULD, MD;  Location: [facility] OR;  Service: General;  Laterality: N/A;   COLONOSCOPY W/ POLYPECTOMY  last done 2016    x 3    CYSTOSCOPY N/A 09/21/2020   Procedure: CYSTOSCOPY;  Surgeon: Elisabeth Valli BIRCH, MD;  Location: [facility];  Service: Urology;  Laterality: N/A;   CYSTOSCOPY W/ RETROGRADES Bilateral 02/04/2020   Procedure: CYSTOSCOPY WITH RETROGRADE PYELOGRAM;  Surgeon: Carolee Sherwood BIRCH MOULD, MD;  Location: [facility];  Service: Urology;  Laterality: Bilateral;   CYSTOSCOPY WITH RETROGRADE PYELOGRAM, URETEROSCOPY AND STENT PLACEMENT Left 03/26/2023   Procedure: CYSTOSCOPY, URETEROSCOPY AND STONE REMOVAL;  Surgeon: Renda Glance, MD;  Location: [facility] ORS;  Service: Urology;  Laterality: Left;   ENDOSCOPIC RETROGRADE CHOLANGIOPANCREATOGRAPHY (ERCP) WITH PROPOFOL  N/A 02/02/2023   Procedure: ENDOSCOPIC RETROGRADE CHOLANGIOPANCREATOGRAPHY (ERCP) WITH PROPOFOL ;  Surgeon: Rollin Dover, MD;  Location: [facility] ENDOSCOPY;  Service: Gastroenterology;  Laterality: N/A;   ERCP N/A 07/22/2022   Procedure: ENDOSCOPIC RETROGRADE CHOLANGIOPANCREATOGRAPHY (ERCP);  Surgeon: Rosalie Kitchens, MD;  Location: [facility] ENDOSCOPY;  Service: Gastroenterology;  Laterality: N/A;   ERCP N/A 07/25/2022   Procedure: ENDOSCOPIC RETROGRADE CHOLANGIOPANCREATOGRAPHY (ERCP);  Surgeon: Rollin Dover, MD;  Location: [facility] ENDOSCOPY;  Service: Gastroenterology;  Laterality: N/A;   FINGER FRACTURE SURGERY Right 07/11/2022   5th digit has a screw   OPEN REDUCTION INTERNAL FIXATION (ORIF) DISTAL RADIAL FRACTURE Right 11/03/2014  Procedure: OPEN REDUCTION INTERNAL FIXATION (ORIF) RIGHT DISTAL RADIAL FRACTURE WITH REPAIR AND RECONSTRUCTION;  Surgeon: Elsie Mussel, MD;  Location: [facility] OR;  Service: Orthopedics;  Laterality: Right;   PANCREATIC STENT PLACEMENT  07/22/2022   Procedure: PANCREATIC STENT PLACEMENT;  Surgeon: Rosalie Kitchens, MD;  Location: [facility] ENDOSCOPY;  Service: Gastroenterology;;   ANNETT  07/22/2022    Procedure: ANNETT;  Surgeon: Rosalie Kitchens, MD;  Location: [facility] ENDOSCOPY;  Service: Gastroenterology;;   CLEDA REMOVAL  02/02/2023   Procedure: STENT REMOVAL;  Surgeon: Rollin Dover, MD;  Location: [facility] ENDOSCOPY;  Service: Gastroenterology;;   TRANSURETHRAL RESECTION OF BLADDER TUMOR WITH MITOMYCIN -C N/A 02/04/2020   Procedure: TRANSURETHRAL RESECTION OF BLADDER TUMOR WITH POST OPERATIVE GEMCITABINE ;  Surgeon: Carolee Sherwood JONETTA DOUGLAS, MD;  Location: [facility];  Service: Urology;  Laterality: N/A;   TRANSURETHRAL RESECTION OF BLADDER TUMOR WITH MITOMYCIN -C N/A 09/21/2020   Procedure: CYSTOSCOPY WITH BLADDER FULGARATION WITH POST OP GEMCITABINE ;  Surgeon: Elisabeth Valli JONETTA, MD;  Location: [facility] ;  Service: Urology;  Laterality: N/A;  1 HR    reports that she quit smoking about 13 years ago. Her smoking use included cigarettes. She started smoking about 63 years ago. She has a 75 pack-year smoking history. She has never used smokeless tobacco. She reports current alcohol use of about 5.0 standard drinks of alcohol per week. She reports that she does not use drugs. family history includes Arthritis in an other family member; Colon polyps in an other family member; Depression in an other family member; Hypertension in an other family member; Migraines in an other family member; Stroke in an other family member. Allergies  Allergen Reactions   Aspirin Swelling and Other (See Comments)    Vasculitis     Review of Systems  Constitutional:  Negative for malaise/fatigue.  Eyes:  Negative for blurred vision.  Respiratory:  Negative for shortness of breath.   Cardiovascular:  Negative for chest pain.  Neurological:  Negative for dizziness, weakness and headaches.      Objective:     BP 120/70   Pulse 74   Temp 97.6 F (36.4 C) (Oral)   Wt 166 lb 9.6 oz (75.6 kg)   SpO2 94%   BMI 26.09 kg/m  BP Readings from Last 3 Encounters:  02/11/24 120/70  01/01/24  118/68  11/21/23 122/80   Wt Readings from Last 3 Encounters:  02/11/24 166 lb 9.6 oz (75.6 kg)  01/01/24 168 lb 6.4 oz (76.4 kg)  11/21/23 171 lb 9.6 oz (77.8 kg)      Physical Exam Constitutional:      Appearance: She is well-developed.  HENT:     Head: Normocephalic and atraumatic.     Ears:     Comments: Minimal cerumen left canal but not obstructing.  No cerumen right canal. Eyes:     Pupils: Pupils are equal, round, and reactive to light.  Neck:     Thyroid : No thyromegaly.  Cardiovascular:     Rate and Rhythm: Normal rate and regular rhythm.     Heart sounds: Normal heart sounds. No murmur heard. Pulmonary:     Effort: No respiratory distress.     Breath sounds: Normal breath sounds. No wheezing or rales.  Abdominal:     Palpations: Abdomen is soft.  Musculoskeletal:     Cervical back: Normal range of motion and neck supple.  Lymphadenopathy:     Cervical: No cervical adenopathy.  Neurological:     General: No focal deficit  present.     Mental Status: She is alert and oriented to person, place, and time.  Psychiatric:        Mood and Affect: Mood normal.      No results found for any visits on 02/11/24.    The ASCVD Risk score (Arnett DK, et al., 2019) failed to calculate for the following reasons:   Cannot find a previous HDL lab   Cannot find a previous total cholesterol lab    Assessment & Plan:   #1 recurrent depression currently stable on fluoxetine  20 mg daily.  Refill provided for 1 year.  #2 COPD.  Patient quit smoking several years ago.  She is currently on Trelegy.  Reminder for flu vaccine.  She has had RSV vaccine already.  Pneumonia vaccines up-to-date.  #3 subjective hearing loss right ear.  No cerumen.  Recommend audiology evaluation  Wolm Scarlet, MD

## 2024-02-15 ENCOUNTER — Ambulatory Visit: Payer: Self-pay | Admitting: Allergy

## 2024-02-15 LAB — STREP PNEUMONIAE 23 SEROTYPES IGG
Pneumo Ab Type 1*: 3.4 ug/mL (ref >1.3)
Pneumo Ab Type 12 (12F)*: 0.8 ug/mL — AB (ref >1.3)
Pneumo Ab Type 14*: 7.3 ug/mL (ref >1.3)
Pneumo Ab Type 17 (17F)*: 2.3 ug/mL (ref >1.3)
Pneumo Ab Type 19 (19F)*: 28.7 ug/mL (ref >1.3)
Pneumo Ab Type 2*: 0.9 ug/mL — AB (ref >1.3)
Pneumo Ab Type 20*: 3 ug/mL (ref >1.3)
Pneumo Ab Type 22 (22F)*: 9.3 ug/mL (ref >1.3)
Pneumo Ab Type 23 (23F)*: 14.8 ug/mL (ref >1.3)
Pneumo Ab Type 26 (6B)*: 16.7 ug/mL (ref >1.3)
Pneumo Ab Type 3*: 5.5 ug/mL (ref >1.3)
Pneumo Ab Type 34 (10A)*: 11.9 ug/mL (ref >1.3)
Pneumo Ab Type 4*: 4.3 ug/mL (ref >1.3)
Pneumo Ab Type 43 (11A)*: 4.1 ug/mL (ref >1.3)
Pneumo Ab Type 5*: 12.3 ug/mL (ref >1.3)
Pneumo Ab Type 51 (7F)*: 5.4 ug/mL (ref >1.3)
Pneumo Ab Type 54 (15B)*: 15.5 ug/mL (ref >1.3)
Pneumo Ab Type 56 (18C)*: 8.8 ug/mL (ref >1.3)
Pneumo Ab Type 57 (19A)*: 4.8 ug/mL (ref >1.3)
Pneumo Ab Type 68 (9V)*: 14.8 ug/mL (ref >1.3)
Pneumo Ab Type 70 (33F)*: 9.2 ug/mL (ref >1.3)
Pneumo Ab Type 8*: 25.1 ug/mL (ref >1.3)
Pneumo Ab Type 9 (9N)*: 10.9 ug/mL (ref >1.3)

## 2024-02-26 ENCOUNTER — Ambulatory Visit: Admitting: Nurse Practitioner

## 2024-02-28 ENCOUNTER — Ambulatory Visit: Admitting: Nurse Practitioner

## 2024-02-28 ENCOUNTER — Encounter: Payer: Self-pay | Admitting: Nurse Practitioner

## 2024-02-28 VITALS — BP 122/64 | HR 74 | Temp 98.1°F | Ht 67.0 in | Wt 168.2 lb

## 2024-02-28 DIAGNOSIS — Z87891 Personal history of nicotine dependence: Secondary | ICD-10-CM | POA: Diagnosis not present

## 2024-02-28 DIAGNOSIS — J309 Allergic rhinitis, unspecified: Secondary | ICD-10-CM

## 2024-02-28 DIAGNOSIS — J432 Centrilobular emphysema: Secondary | ICD-10-CM

## 2024-02-28 MED ORDER — TRELEGY ELLIPTA 100-62.5-25 MCG/ACT IN AEPB: 1.0000 | INHALATION_SPRAY | Freq: Every day | RESPIRATORY_TRACT | 11 refills | Status: AC

## 2024-02-28 NOTE — Assessment & Plan Note (Signed)
 Last exacerbation 11/2023. Clinically stable and appears compensated on current regimen. Minimal symptom burden. Continue triple therapy regimen and PRN SABA. Action plan in place. Trigger prevention reviewed.  Patient Instructions  -Continue Albuterol  inhaler 2 puffs or 3 mL every 6 hours as needed for shortness of breath or wheezing. Notify if symptoms persist despite rescue inhaler/neb use.  -Continue Flonase  nasal spray 2 sprays each nostril daily for nasal congestion/drainage  -Continue Trelegy 1 puff daily. Brush tongue and rinse mouth afterwards  -Continue guaifenesin  600 mg Twice daily for cough/congestion    Follow up in 6 months with Dr. Kara or [name]. If symptoms do not improve or worsen, please contact office for sooner follow up or seek emergency care.

## 2024-02-28 NOTE — Patient Instructions (Addendum)
-  Continue Albuterol  inhaler 2 puffs or 3 mL every 6 hours as needed for shortness of breath or wheezing. Notify if symptoms persist despite rescue inhaler/neb use.  -Continue Flonase  nasal spray 2 sprays each nostril daily for nasal congestion/drainage  -Continue Trelegy 1 puff daily. Brush tongue and rinse mouth afterwards  -Continue guaifenesin  600 mg Twice daily for cough/congestion    Follow up in 6 months with Dr. Kara or Izetta Malachy PIETY. If symptoms do not improve or worsen, please contact office for sooner follow up or seek emergency care.

## 2024-02-28 NOTE — Progress Notes (Signed)
 @Patient  ID: Gordon [name], female    DOB: 11/08/1945, 78 y.o.   MRN: [id number]  Chief Complaint  Patient presents with   Emphysema    Prod cough with clear to white sputum    Referring provider: Micheal Wolm ORN, MD  HPI: 78 year old female, former smoker followed for emphysema. She is a patient of Dr. Luann and last seen in office 11/21/2023 by [facility] NP. Past medical history significant for allergic rhinitis, osteoporosis, hx of bladder cancer, HLD, depression.  TEST/EVENTS:  07/19/2022 CTA chest: no evidence of PE. Atherosclerosis. Decompressed esophagus. Moderate emphysema. Bandlike atelectasis within the lingula, RML and both lower lobes. Previous lung nodules obscured due to motion.  10/30/2022 CXR: diffuse pattern of perihilar bronchial thickening, more affecting the lower lung zones and slightly more prominent on the right compared to the left.   08/14/2022: OV with Dr. Kara for pulmonary consult. Referred for COPD exacerbation. Admitted 1/27-2/8 for acute cholecystitis and developed issues with her breathing - treated for AECOPD. Reports every fall she gets sick with cough, wheezing, SOB. Usually lasts about 3 weeks. Not on inhalers in the past. Former smoker with 75 pack year history. Recovered well since recent admission. No acute issues with her breathing at this time. Preferred to hold off on maintenance therapy until PFTs. F/u 4 months with PFTs.  10/30/2022: OV with Fumi Guadron NP for acute visit. She had COVID around 4/11. She was treated with Paxlovid . She had fevers and chills during her initial illness as well as some URI symptoms and occasional dry cough. She felt like she was recovering but then over the past week has started to feel worse. She has a productive cough with yellow phlegm and is having a lot of chest congestion. She's feeling very fatigued and appetite is not as good as it normally is. She's more short of breath during the day, especially with longer distances.  She's also wheezing at night, which she normally doesn't do. She does still have some sinus congestion and occasionally feels like she has drainage down the back of her throat. She denies any recurrent fevers, chills, hemoptysis, leg swelling, orthopnea. She does not have a rescue inhaler or maintenance. Taking cough syrup she had previously been prescribed and mucinex  over the counter.   11/16/2022:  OV with Terrea Bruster NP for follow up. She is feeling much better compared to her last visit but she does feel like since she came off the steroids and abx, that her cough has started to pick back up again. It can be barky at times. She occasionally produces some white phlegm but sometimes feels like it gets stuck. Her breathing has improved, feels closer to her baseline but still a little more short than normal. She's still noticing wheezing at night, which restarted after she completed steroids. Sinus symptoms have resolved. Denies any fevers, chills, hemoptysis, leg swelling, orthopnea. She has not had to use the rescue inhaler. Insurance wouldn't cover Stiolto so she was prescribed Anoro, which she feels like is helping.   01/01/2023: Ov with Cloa Bushong NP for follow up. After our last visit, she was treated with Z pack and prednisone . She felt better after this. Her breathing feels back to her baseline. She's still coughing, which seems to have picked up some after she finished the steroids. It is primarily dry but occasionally she will get up a small amount of clear phlegm. She uses her nebs every 3 days or so when her chest is feeling more congested,  which does help. She is not having any sinus symptoms or reflux. Some wheezing at night. She denies fevers, chills,  hemoptysis, leg swelling, calf pain. She is using her Anoro every morning. Does feel like this has helped.  FeNO 26 ppb  02/12/2023: OV with Temia Debroux NP for follow up. Her cough has resolved. She's feeling much better. She feels like she's back to her baseline.  Breathing is doing well. The mucinex  helps with her congestion. She's not having to use any cough suppressants. Hasn't had to do her nebs recently. Feels like the Trelegy is helping.   05/08/2023: OV with Dr. Kara. Doing well on Trelegy. Denies any cough, wheezing, SOB. Continues to walk daily. Distance varies. Reports no changes in routine activities. No hospitalizations. Fall is typically a hard time for her. Continue current regimen.  11/21/2023: SHERLEAN with Dshawn Mcnay NP JAELEE LAUGHTER is a 78 year old female with COPD who presents with cough and difficulty breathing. She has been experiencing a persistent cough for the past week, with a particularly severe episode on Monday. The cough is productive, with sputum described as 'whitey, yellow in color. No fever, chills, or hemoptysis are present. Although her breathing feels okay at rest, she struggles to keep up with her husband during walks, especially uphill, which is unusual for her. She does feel some better today.  She has been using Trelegy and has increased her use of the nebulizer to help clear her airways. Additionally, she uses Flonase  nasal spray and takes Mucinex  twice daily, which she finds helpful. She has tried over the counter cough syrups with some benefit.  Stress has been a significant factor recently, as her husband has been diagnosed with esophageal and prostate cancer, leading to frequent visits to doctors and radiation oncologists.  She experiences postnasal drip but does not feel congested. She has not undergone allergy testing before. Fall is usually the worst for her as far as allergy symptoms go.  No sinus symptoms, headaches. FeNO 16 ppb    02/28/2024: Today - follow up Discussed the use of AI scribe software for clinical note transcription with the patient, who gave verbal consent to proceed.  History of Present Illness [name] is a 78 year old female who presents for a follow-up.   Her breathing has been stable  with no significant flare-ups since June. No increased issues with coughing, only the usual morning cough to clear her throat. She has not needed to use her rescue inhaler in months and last used her nebulizer in June. She continues to use Trelegy once a day.  She received a pneumonia vaccine. She notes a pattern of getting sick once a year around the same time, but otherwise, no issues with her breathing. No hemoptysis, fevers, weight loss, anorexia, wheezing.     Allergies  Allergen Reactions   Aspirin Swelling and Other (See Comments)    Vasculitis     Immunization History  Administered Date(s) Administered    sv, Bivalent, Protein Subunit Rsvpref,pf (Abrysvo) 03/24/2022   Fluad Quad(high Dose 65+) 01/26/2022   Hepatitis A, Adult 05/03/2015   INFLUENZA, HIGH DOSE SEASONAL PF 03/26/2014, 03/11/2017, 02/04/2018, 02/11/2019, 02/19/2024   Influenza Split 03/14/2011, 04/29/2012, 02/20/2013   Influenza Whole 03/19/2010   Influenza-Unspecified 03/16/2015, 03/06/2016, 03/11/2017, 02/06/2020, 03/14/2021, 01/26/2022   PFIZER(Purple Top)SARS-COV-2 Vaccination 07/14/2019, 08/04/2019, 02/16/2020   PNEUMOCOCCAL CONJUGATE-20 06/16/2021   Pfizer Covid-19 Vaccine Bivalent Booster 73yrs & up 01/13/2021, 06/16/2021   Pneumococcal Conjugate-13 01/02/2017, 09/01/2019   Pneumococcal Polysaccharide-23 11/03/2009  Td 06/19/2006, 08/27/2007   Tdap 06/06/2019   Unspecified SARS-COV-2 Vaccination 03/24/2022   Zoster Recombinant(Shingrix) 03/11/2017, 06/30/2017    Past Medical History:  Diagnosis Date   Anxiety    aniety attacks   Asthma    Bladder tumor    Colon polyps    COPD (chronic obstructive pulmonary disease) (HCC)    09-08-2020 acute flare up with cough finished antibiotics and steroids cough resolved now   Depression    Hay fever    allergies   History of kidney stones    Migraines    not in 40 years   Wears glasses     Tobacco History: Social History   Tobacco Use  Smoking  Status Former   Current packs/day: 0.00   Average packs/day: 1.5 packs/day for 50.0 years (75.0 ttl pk-yrs)   Types: Cigarettes   Start date: 63   Quit date: 2012   Years since quitting: 13.7  Smokeless Tobacco Never  Tobacco Comments   Former smoker   Counseling given: Not Answered Tobacco comments: Former smoker   Outpatient Medications Prior to Visit  Medication Sig Dispense Refill   albuterol  (PROVENTIL ) (2.5 MG/3ML) 0.083% nebulizer solution Take 3 mLs (2.5 mg total) by nebulization every 6 (six) hours as needed for wheezing or shortness of breath. 75 mL 12   albuterol  (VENTOLIN  HFA) 108 (90 Base) MCG/ACT inhaler Inhale 2 puffs into the lungs every 6 (six) hours as needed for wheezing or shortness of breath. 8 g 2   cetirizine (ZYRTEC) 10 MG tablet Take 10 mg by mouth daily.     Dextromethorphan-guaiFENesin  (MUCINEX  DM) 30-600 MG TB12 Take 1 tablet by mouth in the morning and at bedtime.     FLUoxetine  (PROZAC ) 20 MG capsule Take 1 capsule (20 mg total) by mouth daily. 90 capsule 3   fluticasone  (FLONASE ) 50 MCG/ACT nasal spray SPRAY 1 SPRAY INTO BOTH NOSTRILS DAILY. 16 mL 2   TYLENOL  500 MG tablet Take 500 mg by mouth every 6 (six) hours as needed for mild pain or headache.     Fluticasone -Umeclidin-Vilant (TRELEGY ELLIPTA ) 100-62.5-25 MCG/ACT AEPB TAKE 1 PUFF BY MOUTH EVERY DAY 60 each 11   No facility-administered medications prior to visit.     Review of Systems: As above     Physical Exam:  BP 122/64   Pulse 74   Temp 98.1 F (36.7 C) (Oral)   Ht 5' 7 (1.702 m)   Wt 168 lb 3.2 oz (76.3 kg)   SpO2 95%   BMI 26.34 kg/m   GEN: Pleasant, interactive, well-appearing; in no acute distress. HEENT:  Normocephalic and atraumatic. PERRLA. Sclera white. Nasal turbinates pink, moist and patent bilaterally. No rhinorrhea present. Oropharynx pink and moist, without exudate or edema. No lesions, ulcerations, or postnasal drip.  NECK:  Supple w/ fair ROM. No  lymphadenopathy.   CV: RRR, no m/r/g, no peripheral edema. Pulses intact, +2 bilaterally.  PULMONARY:  Unlabored, regular breathing. Clear b/l A&P w/o wheezes/rales/rhonchi. No accessory muscle use.  GI: BS present and normoactive. Soft, non-tender to palpation. No organomegaly or masses detected.  MSK: No erythema, warmth or tenderness. Cap refil <2 sec all extrem. Neuro: A/Ox3. No focal deficits noted.   Skin: Warm, no lesions or rashe Psych: Normal affect and behavior. Judgement and thought content appropriate.     Lab Results:  CBC    Component Value Date/Time   WBC 7.4 01/01/2024 1147   WBC 11.7 (H) 03/25/2023 0157   RBC 4.91 01/01/2024 1147  RBC 4.23 03/25/2023 0157   HGB 15.0 01/01/2024 1147   HCT 46.3 01/01/2024 1147   PLT 277 01/01/2024 1147   MCV 94 01/01/2024 1147   MCH 30.5 01/01/2024 1147   MCH 31.2 03/25/2023 0157   MCHC 32.4 01/01/2024 1147   MCHC 33.0 03/25/2023 0157   RDW 12.8 01/01/2024 1147   LYMPHSABS 1.5 01/01/2024 1147   MONOABS 0.4 03/22/2023 0401   EOSABS 0.1 01/01/2024 1147   BASOSABS 0.1 01/01/2024 1147    BMET    Component Value Date/Time   NA 135 03/25/2023 0157   K 3.6 03/25/2023 0157   CL 103 03/25/2023 0157   CO2 24 03/25/2023 0157   GLUCOSE 155 (H) 03/25/2023 0157   BUN 20 03/25/2023 0157   CREATININE 1.42 (H) 03/25/2023 0157   CALCIUM 8.7 (L) 03/25/2023 0157   GFRNONAA 38 (L) 03/25/2023 0157   GFRAA  02/09/2010 1100    >60        The eGFR has been calculated using the MDRD equation. This calculation has not been validated in all clinical situations. eGFR's persistently <60 mL/min signify possible Chronic Kidney Disease.    BNP No results found for: BNP   Imaging:  No results found.  Administration History     None           No data to display          Lab Results  Component Value Date   NITRICOXIDE 26 01/01/2023        Assessment & Plan:   Centrilobular emphysema (HCC) Last exacerbation  11/2023. Clinically stable and appears compensated on current regimen. Minimal symptom burden. Continue triple therapy regimen and PRN SABA. Action plan in place. Trigger prevention reviewed.  Patient Instructions  -Continue Albuterol  inhaler 2 puffs or 3 mL every 6 hours as needed for shortness of breath or wheezing. Notify if symptoms persist despite rescue inhaler/neb use.  -Continue Flonase  nasal spray 2 sprays each nostril daily for nasal congestion/drainage  -Continue Trelegy 1 puff daily. Brush tongue and rinse mouth afterwards  -Continue guaifenesin  600 mg Twice daily for cough/congestion    Follow up in 6 months with Dr. Kara or Izetta Malachy PIETY. If symptoms do not improve or worsen, please contact office for sooner follow up or seek emergency care.       I spent 25 minutes of dedicated to the care of this patient on the date of this encounter to include pre-visit review of records, face-to-face time with the patient discussing conditions above, post visit ordering of testing, clinical documentation with the electronic health record, making appropriate referrals as documented, and communicating necessary findings to members of the patients care team.  Comer LULLA Malachy, NP 02/28/2024  Pt aware and understands NP's role.

## 2024-04-03 ENCOUNTER — Ambulatory Visit: Admitting: Allergy

## 2024-04-10 ENCOUNTER — Encounter: Payer: Self-pay | Admitting: Allergy

## 2024-04-10 ENCOUNTER — Other Ambulatory Visit: Payer: Self-pay

## 2024-04-10 ENCOUNTER — Ambulatory Visit: Admitting: Allergy

## 2024-04-10 VITALS — BP 112/74 | HR 83 | Temp 96.3°F | Resp 14 | Ht 67.0 in | Wt 171.0 lb

## 2024-04-10 DIAGNOSIS — B999 Unspecified infectious disease: Secondary | ICD-10-CM

## 2024-04-10 DIAGNOSIS — J3089 Other allergic rhinitis: Secondary | ICD-10-CM | POA: Diagnosis not present

## 2024-04-10 DIAGNOSIS — J449 Chronic obstructive pulmonary disease, unspecified: Secondary | ICD-10-CM

## 2024-04-10 NOTE — Progress Notes (Signed)
 Follow Up Note  RE: [name] MRN: [id number] DOB: [birth year] Date of Office Visit: 04/10/2024  Referring provider: Micheal Wolm ORN, MD Primary care provider: Micheal Wolm ORN, MD  Chief Complaint: Follow-up  History of Present Illness: I had the pleasure of seeing [name] for a follow up visit at the [facility] on 04/10/2024. She is a 78 y.o. female, who is being followed for allergic rhinitis, recurrent infections, copd. Her previous allergy office visit was on 01/01/2024 with Dr. Luke. Today is a regular follow up visit.  Discussed the use of AI scribe software for clinical note transcription with the patient, who gave verbal consent to proceed.    She has experienced no recent issues with her breathing and no exacerbations since her last visit in July. She continues to use Trelegy daily and has a rescue inhaler, which she has not needed to use. She received a pneumonia vaccine in July. She has not experienced any fall allergies, possibly due to staying indoors with air conditioning.  For her allergies, she takes Mucinex  and an over-the-counter allergy medication from [facility], similar to Zyrtec, Claritin, or Allegra , at a dose of 4 mg once daily in the morning. She also uses a nasal spray, likely Flonase , once daily after her Trelegy.  No fever, chills, or changes in appetite, although she mentions eating too much.   Her husband is currently hospitalized for complications following surgery for gastroesophageal cancer.      2025 labs: Your blood count, immunoglobulin levels were normal which is great. You also have good protection against diptheria and tetanus.   However, your pneumococcal titers were borderline protective. Sometimes people with low titers are more likely to develop respiratory infections caused by the bacteria strep pneumoniae. I would like for you to get the pneumovax 23  vaccine (also known as the pneumonia shot) as it can  boost the levels and offer protection against this bacteria in the future. Once you get the vaccine, we check the levels 4 weeks afterwards to make sure your immune system responded to the vaccine appropriately. You can get the pneumovax vaccine at your PCP's office or pharmacy. If they don't offer it there, let us  know and in certain cases we have given them in our office.    Make sure it's the pneumovax 23  vaccine and NOT the prevnar.  It looks like you got the prevnar 20 and 13 vaccines in the past.  You had a great response to the pneumonia shot. Keep track of infections and antibiotics use.   Assessment and Plan: [name] is a 78 y.o. female with: Other allergic rhinitis Past history - Managed with Zyrtec and Flonase . No prior AIT but symptoms mild. 2025 labs positive to dust mites, ragweed, borderline to mold and weeds. Interim history - controlled.  Continue environmental control measures as below. Use over the counter antihistamines such as Zyrtec (cetirizine), Claritin (loratadine), Allegra  (fexofenadine ), or Xyzal (levocetirizine) daily as needed. May switch antihistamines every few months. Use Flonase  (fluticasone ) nasal spray 1-2 sprays per nostril once a day as needed for nasal congestion.  Nasal saline spray (i.e., Simply Saline) or nasal saline lavage (i.e., NeilMed) is recommended as needed and prior to medicated nasal sprays.  Recurrent infections Past history - Usually gets frequent bronchitis requiring antibiotics and prednisone .  Interim history - 2025 labs normal immunoglobulin levels, poor pneumococcal titers with great response to the pneumovax 23 . No infections since last visit.  Keep track of infections and  antibiotics use. Recommend annual flu vaccine in the fall.    Chronic obstructive pulmonary disease, unspecified COPD type (HCC) Past history - 50+ pack year smoker - quit. Requires at least 1 course of prednisone  per year. Managed by pulmonology. 2025 spirometry was  normal.  Interim history - 2025 labs showed eos of only 100. Doing well with regimen managed by pulm.  Today's spirometry was unremarkable.  Managed by pulmonology.  Daily controller medication(s): continue Trelegy 100mcg 1 puff once a day as before and rinse mouth after each use. May use albuterol  rescue inhaler 2 puffs or nebulizer every 4 to 6 hours as needed for shortness of breath, chest tightness, coughing, and wheezing. May use albuterol  rescue inhaler 2 puffs 5 to 15 minutes prior to strenuous physical activities. Monitor frequency of use - if you need to use it more than twice per week on a consistent basis let us  know.   Return in about 1 year (around 04/10/2025).  No orders of the defined types were placed in this encounter.  Lab Orders  No laboratory test(s) ordered today    Diagnostics: Spirometry:  Tracings reviewed. Her effort: It was hard to get consistent efforts and there is a question as to whether this reflects a maximal maneuver. FVC: 2.72L FEV1: 1.99L, 93% predicted FEV1/FVC ratio: 73% Interpretation: No overt abnormalities noted given today's efforts.  Please see scanned spirometry results for details.  Results discussed with patient/family.   Medication List:  Current Outpatient Medications  Medication Sig Dispense Refill   albuterol  (PROVENTIL ) (2.5 MG/3ML) 0.083% nebulizer solution Take 3 mLs (2.5 mg total) by nebulization every 6 (six) hours as needed for wheezing or shortness of breath. 75 mL 12   albuterol  (VENTOLIN  HFA) 108 (90 Base) MCG/ACT inhaler Inhale 2 puffs into the lungs every 6 (six) hours as needed for wheezing or shortness of breath. 8 g 2   cetirizine (ZYRTEC) 10 MG tablet Take 10 mg by mouth daily.     Dextromethorphan-guaiFENesin  (MUCINEX  DM) 30-600 MG TB12 Take 1 tablet by mouth in the morning and at bedtime.     FLUoxetine  (PROZAC ) 20 MG capsule Take 1 capsule (20 mg total) by mouth daily. 90 capsule 3   Fluticasone -Umeclidin-Vilant  (TRELEGY ELLIPTA ) 100-62.5-25 MCG/ACT AEPB Inhale 1 puff into the lungs daily. 60 each 11   TYLENOL  500 MG tablet Take 500 mg by mouth every 6 (six) hours as needed for mild pain or headache.     fluticasone  (FLONASE ) 50 MCG/ACT nasal spray SPRAY 1 SPRAY INTO BOTH NOSTRILS DAILY. 16 mL 2   No current facility-administered medications for this visit.   Allergies: Allergies  Allergen Reactions   Aspirin Swelling and Other (See Comments)    Vasculitis    I reviewed her past medical history, social history, family history, and environmental history and no significant changes have been reported from her previous visit.  Review of Systems  Constitutional:  Negative for appetite change, chills, fever and unexpected weight change.  HENT:  Negative for congestion and rhinorrhea.   Eyes:  Negative for itching.  Respiratory:  Negative for cough, chest tightness, shortness of breath and wheezing.   Cardiovascular:  Negative for chest pain.  Gastrointestinal:  Negative for abdominal pain.  Genitourinary:  Negative for difficulty urinating.  Skin:  Negative for rash.  Allergic/Immunologic: Positive for environmental allergies.  Neurological:  Negative for headaches.    Objective: BP 112/74 (BP Location: Left Arm, Patient Position: Sitting, Cuff Size: Normal)   Pulse 83  Temp (!) 96.3 F (35.7 C) (Temporal)   Resp 14   Ht 5' 7 (1.702 m)   Wt 171 lb (77.6 kg)   SpO2 97%   BMI 26.78 kg/m  Body mass index is 26.78 kg/m. Physical Exam Vitals and nursing note reviewed.  Constitutional:      Appearance: Normal appearance. She is well-developed.  HENT:     Head: Normocephalic and atraumatic.     Right Ear: Tympanic membrane and external ear normal.     Left Ear: Tympanic membrane and external ear normal.     Nose: Nose normal.     Mouth/Throat:     Mouth: Mucous membranes are moist.     Pharynx: Oropharynx is clear.  Eyes:     Conjunctiva/sclera: Conjunctivae normal.   Cardiovascular:     Rate and Rhythm: Normal rate and regular rhythm.     Heart sounds: Normal heart sounds. No murmur heard.    No friction rub. No gallop.  Pulmonary:     Effort: Pulmonary effort is normal.     Breath sounds: Normal breath sounds. No wheezing, rhonchi or rales.  Musculoskeletal:     Cervical back: Neck supple.  Skin:    General: Skin is warm.     Findings: No rash.  Neurological:     Mental Status: She is alert and oriented to person, place, and time.  Psychiatric:        Behavior: Behavior normal.    Previous notes and tests were reviewed. The plan was reviewed with the patient/family, and all questions/concerned were addressed.  It was my pleasure to see [name] today and participate in her care. Please feel free to contact me with any questions or concerns.  Sincerely,  Orlan Cramp, DO Allergy & Immunology  [facility]  [facility] office: [phone] [facility] office: [phone]

## 2024-04-10 NOTE — Patient Instructions (Addendum)
 Infections Keep track of infections and antibiotics use. Recommend annual flu vaccine in the fall.   Environmental allergies 2025 labs - positive to dust mites, ragweed, borderline to mold and weeds. Continue environmental control measures as below. Use over the counter antihistamines such as Zyrtec (cetirizine), Claritin (loratadine), Allegra  (fexofenadine ), or Xyzal (levocetirizine) daily as needed. May switch antihistamines every few months. Use Flonase  (fluticasone ) nasal spray 1-2 sprays per nostril once a day as needed for nasal congestion.  Nasal saline spray (i.e., Simply Saline) or nasal saline lavage (i.e., NeilMed) is recommended as needed and prior to medicated nasal sprays.  Breathing Managed by pulmonology.  Daily controller medication(s): continue Trelegy 100mcg 1 puff once a day as before and rinse mouth after each use. May use albuterol  rescue inhaler 2 puffs or nebulizer every 4 to 6 hours as needed for shortness of breath, chest tightness, coughing, and wheezing. May use albuterol  rescue inhaler 2 puffs 5 to 15 minutes prior to strenuous physical activities. Monitor frequency of use - if you need to use it more than twice per week on a consistent basis let us  know.  Breathing control goals:  Full participation in all desired activities (may need albuterol  before activity) Albuterol  use two times or less a week on average (not counting use with activity) Cough interfering with sleep two times or less a month Oral steroids no more than once a year No hospitalizations   Return in about 1 year (around 04/10/2025). Or sooner if needed.    Reducing Pollen Exposure Pollen seasons: trees (spring), grass (summer) and ragweed/weeds (fall). Keep windows closed in your home and car to lower pollen exposure.  Install air conditioning in the bedroom and throughout the house if possible.  Avoid going out in dry windy days - especially early morning. Pollen counts are highest  between 5 - 10 AM and on dry, hot and windy days.  Save outside activities for late afternoon or after a heavy rain, when pollen levels are lower.  Avoid mowing of grass if you have grass pollen allergy. Be aware that pollen can also be transported indoors on people and pets.  Dry your clothes in an automatic dryer rather than hanging them outside where they might collect pollen.  Rinse hair and eyes before bedtime.  Control of House Dust Mite Allergen Dust mite allergens are a common trigger of allergy and asthma symptoms. While they can be found throughout the house, these microscopic creatures thrive in warm, humid environments such as bedding, upholstered furniture and carpeting. Because so much time is spent in the bedroom, it is essential to reduce mite levels there.  Encase pillows, mattresses, and box springs in special allergen-proof fabric covers or airtight, zippered plastic covers.  Bedding should be washed weekly in hot water (130 F) and dried in a hot dryer. Allergen-proof covers are available for comforters and pillows that can't be regularly washed.  Wash the allergy-proof covers every few months. Minimize clutter in the bedroom. Keep pets out of the bedroom.  Keep humidity less than 50% by using a dehumidifier or air conditioning. You can buy a humidity measuring device called a hygrometer to monitor this.  If possible, replace carpets with hardwood, linoleum, or washable area rugs. If that's not possible, vacuum frequently with a vacuum that has a HEPA filter. Remove all upholstered furniture and non-washable window drapes from the bedroom. Remove all non-washable stuffed toys from the bedroom.  Wash stuffed toys weekly.  Mold Control Mold and fungi can grow  on a variety of surfaces provided certain temperature and moisture conditions exist.  Outdoor molds grow on plants, decaying vegetation and soil. The major outdoor mold, Alternaria and Cladosporium, are found in very high  numbers during hot and dry conditions. Generally, a late summer - fall peak is seen for common outdoor fungal spores. Rain will temporarily lower outdoor mold spore count, but counts rise rapidly when the rainy period ends. The most important indoor molds are Aspergillus and Penicillium. Dark, humid and poorly ventilated basements are ideal sites for mold growth. The next most common sites of mold growth are the bathroom and the kitchen. Outdoor (Seasonal) Mold Control Use air conditioning and keep windows closed. Avoid exposure to decaying vegetation. Avoid leaf raking. Avoid grain handling. Consider wearing a face mask if working in moldy areas.  Indoor (Perennial) Mold Control  Maintain humidity below 50%. Get rid of mold growth on hard surfaces with water, detergent and, if necessary, 5% bleach (do not mix with other cleaners). Then dry the area completely. If mold covers an area more than 10 square feet, consider hiring an indoor environmental professional. For clothing, washing with soap and water is best. If moldy items cannot be cleaned and dried, throw them away. Remove sources e.g. contaminated carpets. Repair and seal leaking roofs or pipes. Using dehumidifiers in damp basements may be helpful, but empty the water and clean units regularly to prevent mildew from forming. All rooms, especially basements, bathrooms and kitchens, require ventilation and cleaning to deter mold and mildew growth. Avoid carpeting on concrete or damp floors, and storing items in damp areas.

## 2024-04-22 DIAGNOSIS — N200 Calculus of kidney: Secondary | ICD-10-CM | POA: Diagnosis not present

## 2024-05-02 DIAGNOSIS — N200 Calculus of kidney: Secondary | ICD-10-CM | POA: Diagnosis not present

## 2024-05-02 DIAGNOSIS — R896 Abnormal cytological findings in specimens from other organs, systems and tissues: Secondary | ICD-10-CM | POA: Diagnosis not present

## 2024-05-02 DIAGNOSIS — N952 Postmenopausal atrophic vaginitis: Secondary | ICD-10-CM | POA: Diagnosis not present

## 2024-05-02 DIAGNOSIS — C678 Malignant neoplasm of overlapping sites of bladder: Secondary | ICD-10-CM | POA: Diagnosis not present

## 2024-05-02 DIAGNOSIS — R399 Unspecified symptoms and signs involving the genitourinary system: Secondary | ICD-10-CM | POA: Diagnosis not present

## 2024-05-06 DIAGNOSIS — H2513 Age-related nuclear cataract, bilateral: Secondary | ICD-10-CM | POA: Diagnosis not present

## 2024-05-06 DIAGNOSIS — H43813 Vitreous degeneration, bilateral: Secondary | ICD-10-CM | POA: Diagnosis not present

## 2024-05-06 DIAGNOSIS — H5213 Myopia, bilateral: Secondary | ICD-10-CM | POA: Diagnosis not present

## 2024-06-20 ENCOUNTER — Other Ambulatory Visit (HOSPITAL_BASED_OUTPATIENT_CLINIC_OR_DEPARTMENT_OTHER): Payer: Self-pay

## 2024-06-24 ENCOUNTER — Other Ambulatory Visit: Payer: Self-pay | Admitting: Family Medicine

## 2024-06-24 DIAGNOSIS — Z1231 Encounter for screening mammogram for malignant neoplasm of breast: Secondary | ICD-10-CM

## 2024-07-21 ENCOUNTER — Ambulatory Visit

## 2024-08-04 ENCOUNTER — Ambulatory Visit
